# Patient Record
Sex: Male | Born: 1950
Health system: Southern US, Community
[De-identification: ages and names within clinical notes are randomized; demographics above are authoritative.]

## PROBLEM LIST (undated history)

## (undated) DIAGNOSIS — M109 Gout, unspecified: Secondary | ICD-10-CM

## (undated) DIAGNOSIS — K5790 Diverticulosis of intestine, part unspecified, without perforation or abscess without bleeding: Secondary | ICD-10-CM

## (undated) DIAGNOSIS — E66813 Obesity, class 3: Secondary | ICD-10-CM

## (undated) DIAGNOSIS — T7840XA Allergy, unspecified, initial encounter: Secondary | ICD-10-CM

## (undated) DIAGNOSIS — E785 Hyperlipidemia, unspecified: Secondary | ICD-10-CM

## (undated) DIAGNOSIS — K5792 Diverticulitis of intestine, part unspecified, without perforation or abscess without bleeding: Secondary | ICD-10-CM

## (undated) DIAGNOSIS — M1990 Unspecified osteoarthritis, unspecified site: Secondary | ICD-10-CM

## (undated) DIAGNOSIS — J45909 Unspecified asthma, uncomplicated: Secondary | ICD-10-CM

## (undated) HISTORY — DX: Unspecified asthma, uncomplicated: J45.909

## (undated) HISTORY — DX: Hyperlipidemia, unspecified: E78.5

## (undated) HISTORY — DX: Gout, unspecified: M10.9

## (undated) HISTORY — DX: Diverticulitis of intestine, part unspecified, without perforation or abscess without bleeding: K57.92

## (undated) HISTORY — DX: Allergy, unspecified, initial encounter: T78.40XA

## (undated) HISTORY — DX: Unspecified osteoarthritis, unspecified site: M19.90

## (undated) HISTORY — DX: Obesity, class 3: E66.813

## (undated) HISTORY — DX: Diverticulosis of intestine, part unspecified, without perforation or abscess without bleeding: K57.90

## (undated) HISTORY — DX: Morbid (severe) obesity due to excess calories: E66.01

---

## 2005-09-23 ENCOUNTER — Inpatient Hospital Stay (HOSPITAL_COMMUNITY): Admission: EM | Admit: 2005-09-23 | Discharge: 2005-09-30 | Payer: Self-pay | Admitting: Internal Medicine

## 2005-09-23 ENCOUNTER — Encounter: Payer: Self-pay | Admitting: Internal Medicine

## 2005-09-23 ENCOUNTER — Ambulatory Visit: Payer: Self-pay | Admitting: Critical Care Medicine

## 2006-02-26 ENCOUNTER — Emergency Department (HOSPITAL_COMMUNITY): Admission: EM | Admit: 2006-02-26 | Discharge: 2006-02-26 | Payer: Self-pay | Admitting: Emergency Medicine

## 2006-03-02 ENCOUNTER — Emergency Department (HOSPITAL_COMMUNITY): Admission: EM | Admit: 2006-03-02 | Discharge: 2006-03-02 | Payer: Self-pay | Admitting: Family Medicine

## 2006-03-25 ENCOUNTER — Encounter: Admission: RE | Admit: 2006-03-25 | Discharge: 2006-03-25 | Payer: Self-pay | Admitting: Orthopaedic Surgery

## 2011-03-04 ENCOUNTER — Encounter: Payer: Self-pay | Admitting: *Deleted

## 2011-03-04 ENCOUNTER — Encounter: Payer: Federal, State, Local not specified - PPO | Attending: Family Medicine | Admitting: *Deleted

## 2011-03-04 DIAGNOSIS — Z713 Dietary counseling and surveillance: Secondary | ICD-10-CM | POA: Insufficient documentation

## 2011-03-04 DIAGNOSIS — E119 Type 2 diabetes mellitus without complications: Secondary | ICD-10-CM | POA: Insufficient documentation

## 2011-03-04 NOTE — Patient Instructions (Addendum)
Goals:  Follow Diabetes Meal Plan as instructed (yellow card). First month, focus on Carb-Counting.  Eat 3 meals and 2 snacks, every 3-5 hrs - AVOID meal skipping.  Add lean protein foods to all meals/snacks.  Aim for 30-45 mins of physical activity daily.  Continue to avoid sugar-sweetened beverages and foods.  Record and bring food record to your next nutrition visit.

## 2011-03-04 NOTE — Progress Notes (Signed)
Medical Nutrition Therapy:  Appt start time: 1200 end time:  1300.  Assessment:  T2DM.  Pt with strong family h/o T2DM here for assessment of new onset T2DM. Reports an A1c of 15% and BG 300 mg/dl at MD office mid-Oct 1610; BG of 153 mg/dL at MD last week. No labs available at time of visit to confirm. States he has not been instructed to check BGs. Pt has made many dietary changes since dx. Previously consumed 24-36 oz reg coke/day, now only diet coke and water. Reports he has also d/c'd all sweets, except over Thanksgiving. Pt skips meals daily and excessive CHO intake noted. Pt likes to cook, and states he does not eat out often. Reports physical activity every other day for 30 min.Marland Kitchen  MEDICATIONS: See medication list; reconciled with patient   DIETARY INTAKE:  Usual eating pattern includes 2 meals and 1-2 snacks per day.  24-hr recall:  B ( AM): Bacon (4), eggs, grits (1 c), toast - wheat (2); coffee (black)  Snk ( AM): none  L ( PM): 6 pk peanut butter nabs Snk ( PM): none D ( PM): Cabbage, left over [place], tomato, 3 pcs wheat bread Snk ( PM): applesauce (1/2 c) OR banana (30 g) Beverages: Coffee, water, diet coke  Usual physical activity: Walks 30 min every 3-4 days/week  Estimated energy needs: 1800 calories 200 g carbohydrates 110 g protein 50-55 g fat  Progress Towards Goal(s):  In progress.   Nutritional Diagnosis:  -2.1 Impaired nutrient utilization related to glucose as evidenced by patient reported A1c of 15% and a new dx of T2DM.    Intervention/Goals:  Follow Diabetes Meal Plan as instructed (yellow card). First month, focus on Carb-Counting.  Eat 3 meals and 2 snacks, every 3-5 hrs - AVOID meal skipping.  Add lean protein foods to all meals/snacks.  Aim for 30-45 mins of physical activity daily.  Continue to avoid sugar-sweetened beverages and foods.  Record and bring food record to your next nutrition visit.  Handouts given during visit  include:  Living Well with DM - [employer]  45-60g CHO menus/snack list  Food Record (blank)  Monitoring/Evaluation:  Dietary intake, exercise, A1c (as available), and body weight in 4 week(s).

## 2011-03-05 ENCOUNTER — Encounter: Payer: Self-pay | Admitting: *Deleted

## 2011-04-15 ENCOUNTER — Encounter: Payer: Self-pay | Admitting: *Deleted

## 2011-04-15 ENCOUNTER — Encounter: Payer: Federal, State, Local not specified - PPO | Attending: Family Medicine | Admitting: *Deleted

## 2011-04-15 DIAGNOSIS — E119 Type 2 diabetes mellitus without complications: Secondary | ICD-10-CM | POA: Insufficient documentation

## 2011-04-15 DIAGNOSIS — Z713 Dietary counseling and surveillance: Secondary | ICD-10-CM | POA: Insufficient documentation

## 2011-04-15 NOTE — Patient Instructions (Addendum)
Goals:  Follow Diabetes Meal Plan as instructed (yellow card). Continue focus on Carb-Counting/measure portions.  Add lean protein foods to all meals/snacks.  Aim for 30 mins of physical activity 3 days/wk - gradually increase.   Estimated energy needs: 1800 calories 200 g carbohydrates 110 g protein 50-55 g fat

## 2011-04-15 NOTE — Progress Notes (Signed)
Medical Nutrition Therapy:  Appt start time: 1500  End time: 1530.  Primary concerns today:  T2DM; follow up  Pt here for follow up. Has gained ~3 lbs since last visit (03/04/11). Returns to MD at end of Jan for repeat blood work. Still not checking BG at home (not ordered by MD). Continues dietary changes since dx. No exercise over holidays. Reports pain in left shoulder (rotator cuff).  MEDICATIONS: See medication list; pt reports addition of pravastatin.   DIETARY INTAKE:  Usual eating pattern includes 2-3 meals and 1 snack per day. Has slightly decreased portion sizes, but continues to overeat CHO at meals via starches and fruit.  24-hr recall:  B ( AM): Cereal (Raisan Bran or HN Cheerios) w/  2% milk OR eggs, bacon (2-3 pc) grits (1 c), toast - wheat (2); coffee (black)  Snk ( AM): none  L ( PM): Sandwich - ham and cheese w/ miracle whip on wheat, sometimes pc of fruit; diet soda or water  Snk ( PM): none D ( PM): Chicken, green beans, 1/2 potato (3 oz) Snk ( PM): fruit cup (drinks juice) OR banana (1/2) OR 6 pk PB crackers Beverages: Coffee, water, diet coke  Usual physical activity: Recently resumed walks 2x/wk @ 20 min  Estimated energy needs: 1800 calories 200 g carbohydrates 110 g protein 50-55 g fat  Progress Towards Goal(s):  In progress; continue   Nutritional Diagnosis:  [place]-2.1 Impaired nutrient utilization related to glucose as evidenced by patient reported A1c of 15% and a new dx of T2DM.    Intervention/Goals:  Follow Diabetes Meal Plan as instructed (yellow card). First month, focus on Carb-Counting.  Eat 3 meals and 2 snacks, every 3-5 hrs - AVOID meal skipping.  Add lean protein foods to all meals/snacks.  Aim for 30-45 mins of physical activity daily.  Continue to avoid sugar-sweetened beverages and foods.  Record and bring food record to your next nutrition visit.  Handouts given during visit include:  Living Well with DM - [employer]  45-60g CHO  menus/snack list  Food Record (blank)  Monitoring/Evaluation:  Dietary intake, exercise, A1c (as available), and body weight in 4 week(s).

## 2011-05-20 ENCOUNTER — Encounter: Payer: Self-pay | Admitting: *Deleted

## 2011-05-20 ENCOUNTER — Encounter: Payer: Federal, State, Local not specified - PPO | Attending: Family Medicine | Admitting: *Deleted

## 2011-05-20 DIAGNOSIS — Z713 Dietary counseling and surveillance: Secondary | ICD-10-CM | POA: Insufficient documentation

## 2011-05-20 DIAGNOSIS — E119 Type 2 diabetes mellitus without complications: Secondary | ICD-10-CM | POA: Insufficient documentation

## 2011-05-20 NOTE — Progress Notes (Signed)
Medical Nutrition Therapy:  Appt start time: 1500  End time: 1530.  Primary concerns today:  T2DM; follow up  Pt here for follow up with additional 3.5 lb wt gain since last visit (04/15/11). Reports recent blood work results including FBG 114 mg, A1c 7.5%, and TC 158 mg. Still not checking BG at home. Continues dietary changes since dx. Has increase exercise since last visit. Concerned about lack of wt loss. Will do calorie count on 3-day food record provided by pt at visit. Reports pain in left shoulder (rotator cuff) is subsiding.  TANITA  BODY COMP RESULTS %Fat: 55.9% FM: 179.5 lb FFM: 141.5 lb TBW: 103.5 lb  MEDICATIONS: See medication list; No changes since last visit.   DIETARY INTAKE:  Usual eating pattern includes 2-3 meals and 1 snack per day. Has slightly decreased portion sizes, but continues to overeat CHO at meals via starches and fruit.  Usual physical activity:  Walking 6-7d/wk @ 35-40 min  Estimated energy needs: 1800 calories 200 g carbohydrates 110 g protein 50-55 g fat  Progress Towards Goal(s):  In progress; continue   Nutritional Diagnosis:  [place]-2.1 Impaired nutrient utilization related to glucose as evidenced by patient reported A1c of 15% and a new dx of T2DM.    Intervention/Goals:  Continue to follow Diabetes Meal Plan as instructed (yellow card). Continue focus on Carb-Counting/measure portions.  Add lean protein foods to all meals/snacks.  Continue physical activity daily.  Monitoring/Evaluation:  Dietary intake, exercise, A1c (as available), and body weight in 8 week(s).

## 2011-05-20 NOTE — Patient Instructions (Signed)
Goals:  Continue to follow Diabetes Meal Plan as instructed (yellow card). Continue focus on Carb-Counting/measure portions.  Add lean protein foods to all meals/snacks.  Continue physical activity daily.  Estimated energy needs: 1800 calories 200 g carbohydrates 110 g protein 50-55 g fat

## 2011-10-11 ENCOUNTER — Other Ambulatory Visit: Payer: Self-pay | Admitting: Obstetrics and Gynecology

## 2011-10-11 DIAGNOSIS — R1032 Left lower quadrant pain: Secondary | ICD-10-CM

## 2011-10-12 ENCOUNTER — Ambulatory Visit
Admission: RE | Admit: 2011-10-12 | Discharge: 2011-10-12 | Disposition: A | Discharge status: Home / Self Care | Payer: Federal, State, Local not specified - PPO | Source: Ambulatory Visit | Attending: Obstetrics and Gynecology | Admitting: Obstetrics and Gynecology

## 2011-10-12 DIAGNOSIS — R1032 Left lower quadrant pain: Secondary | ICD-10-CM

## 2011-10-12 MED ORDER — IOHEXOL 300 MG/ML  SOLN
100.0000 mL | Freq: Once | INTRAMUSCULAR | Status: AC | PRN
  Administered 2011-10-12: 100 mL via INTRAVENOUS

## 2015-06-17 ENCOUNTER — Emergency Department (INDEPENDENT_AMBULATORY_CARE_PROVIDER_SITE_OTHER)
Admission: EM | Admit: 2015-06-17 | Discharge: 2015-06-17 | Disposition: A | Discharge status: Home / Self Care | Payer: PPO | Source: Home / Self Care | Attending: Family Medicine | Admitting: Family Medicine

## 2015-06-17 ENCOUNTER — Encounter (HOSPITAL_COMMUNITY): Payer: Self-pay | Admitting: *Deleted

## 2015-06-17 DIAGNOSIS — Z760 Encounter for issue of repeat prescription: Secondary | ICD-10-CM

## 2015-06-17 MED ORDER — PRAVASTATIN SODIUM 40 MG PO TABS: 40.0000 mg | ORAL_TABLET | Freq: Every day | ORAL | Status: DC

## 2015-06-17 MED ORDER — METFORMIN HCL 1000 MG PO TABS: 1000.0000 mg | ORAL_TABLET | Freq: Two times a day (BID) | ORAL | Status: DC

## 2015-06-17 NOTE — ED Provider Notes (Signed)
CSN: [id number]     Arrival date & time 06/17/15  1304 History   First MD Initiated Contact with Patient 06/17/15 1340     Chief Complaint  Patient presents with  . Medication Refill   (Consider location/radiation/quality/duration/timing/severity/associated sxs/prior Treatment) HPI Pt's PCP died no referred PCP. Out of metformin and pravastatin. No other complaints.  Last A1C 6.1  Past Medical History  Diagnosis Date  . Gout   . Diabetes mellitus   . Diverticulitis   . Diverticulosis   . Obesity, Class III, BMI 40-49.9 (morbid obesity) ([place])   . Hyperlipidemia   . Arthritis    History reviewed. No pertinent past surgical history. Family History  Problem Relation Age of Onset  . Diabetes Brother   . Hypertension Brother   . Diabetes Brother   . Hypertension Brother   . Diabetes Father   . Hypertension Father   . Diabetes Sister    Social History  Substance Use Topics  . Smoking status: Never Smoker   . Smokeless tobacco: None  . Alcohol Use: Yes    Review of Systems Medication refill Allergies  Review of patient's allergies indicates no known allergies.  Home Medications   Prior to Admission medications   Medication Sig Start Date End Date Taking? Authorizing Provider  LISINOPRIL PO Take by mouth daily.     Yes Historical Provider, MD  metFORMIN (GLUCOPHAGE) 1000 MG tablet Take 1,000 mg by mouth daily with breakfast.     Yes Historical Provider, MD  pravastatin (PRAVACHOL) 10 MG tablet Take 10 mg by mouth daily.   Yes Historical Provider, MD   Meds Ordered and Administered this Visit  Medications - No data to display  BP 141/89 mmHg  Pulse 92  Temp(Src) 97.8 F (36.6 C) (Oral)  Resp 16  SpO2 97% No data found.   Physical Exam NURSES NOTES AND VITAL SIGNS REVIEWED. CONSTITUTIONAL: Well developed, well nourished, no acute distress HEENT: normocephalic, atraumatic, right and left TM's are normal EYES: Conjunctiva normal NECK:normal ROM, supple, no  adenopathy PULMONARY:No respiratory distress, normal effort, Lungs: CTAb/l, no wheezes, or increased work of breathing CARDIOVASCULAR: RRR, no murmur ABDOMEN: soft, ND, NT, +'ve BS MUSCULOSKELETAL: Normal ROM of all extremities,  SKIN: warm and dry without rash PSYCHIATRIC: Mood and affect, behavior are normal  ED Course  Procedures (including critical care time)  Labs Review Labs Reviewed - No data to display  Imaging Review No results found.   Visual Acuity Review  Right Eye Distance:   Left Eye Distance:   Bilateral Distance:    Right Eye Near:   Left Eye Near:    Bilateral Near:       Pt is advised that [facility] is happy to refill this medication once. It is not in the best interest of the patient to let [facility] act as his PCP. Future medication request must be obtained from PCP.  This is a one time service.   MDM  No diagnosis found.  Patient is reassured that there are no issues that require transfer to higher level of care at this time or additional tests. Patient is advised to continue home symptomatic treatment. Patient is advised that if there are new or worsening symptoms to attend the emergency department, contact primary care provider, or return to UC. Instructions of care provided discharged home in stable condition. Return to work/school note provided.   THIS NOTE WAS GENERATED USING A VOICE RECOGNITION SOFTWARE PROGRAM. ALL REASONABLE EFFORTS  WERE MADE TO PROOFREAD THIS  DOCUMENT FOR ACCURACY.  I have verbally reviewed the discharge instructions with the patient. A printed AVS was given to the patient.  All questions were answered prior to discharge.      Konrad Felix, [place] 06/17/15 [phone]

## 2015-06-17 NOTE — ED Notes (Addendum)
Patient reports running out of prescription for metformin and pravastation, last dose last night. Pt no longer has PCP. Patient reports no issues. No pain. Diabetes has been under control.

## 2015-06-17 NOTE — Discharge Instructions (Signed)
Medicine Refill at the Emergency Department  We have refilled your medicine today, but it is best for you to get refills through your primary health care provider's office. In the future, please plan ahead so you do not need to get refills from the emergency department.  If the medicine we refilled was a maintenance medicine, you may have received only enough to get you by until you are able to see your regular health care provider.     This information is not intended to replace advice given to you by your health care provider. Make sure you discuss any questions you have with your health care provider.     Document Released: 07/09/2003 Document Revised: 04/12/2014 Document Reviewed: 06/29/2013  Elsevier Interactive Patient Education 2016 Elsevier Inc.

## 2015-08-28 ENCOUNTER — Ambulatory Visit (INDEPENDENT_AMBULATORY_CARE_PROVIDER_SITE_OTHER): Payer: PPO | Admitting: Family

## 2015-08-28 ENCOUNTER — Other Ambulatory Visit (INDEPENDENT_AMBULATORY_CARE_PROVIDER_SITE_OTHER): Payer: PPO

## 2015-08-28 ENCOUNTER — Encounter: Payer: Self-pay | Admitting: Family

## 2015-08-28 VITALS — BP 110/80 | HR 83 | Temp 97.7°F | Resp 16 | Ht 69.0 in | Wt 334.0 lb

## 2015-08-28 DIAGNOSIS — E1122 Type 2 diabetes mellitus with diabetic chronic kidney disease: Secondary | ICD-10-CM | POA: Insufficient documentation

## 2015-08-28 DIAGNOSIS — M109 Gout, unspecified: Secondary | ICD-10-CM

## 2015-08-28 DIAGNOSIS — E119 Type 2 diabetes mellitus without complications: Secondary | ICD-10-CM | POA: Diagnosis not present

## 2015-08-28 DIAGNOSIS — Z23 Encounter for immunization: Secondary | ICD-10-CM | POA: Diagnosis not present

## 2015-08-28 DIAGNOSIS — J302 Other seasonal allergic rhinitis: Secondary | ICD-10-CM

## 2015-08-28 LAB — BASIC METABOLIC PANEL
BUN: 15 mg/dL (ref 6–23)
CALCIUM: 9.4 mg/dL (ref 8.4–10.5)
CO2: 27 meq/L (ref 19–32)
CREATININE: 1.2 mg/dL (ref 0.40–1.50)
Chloride: 105 mEq/L (ref 96–112)
GFR: 77.93 mL/min (ref >60.00)
GLUCOSE: 139 mg/dL — AB (ref 70–99)
POTASSIUM: 4.6 meq/L (ref 3.5–5.1)
SODIUM: 137 meq/L (ref 135–145)

## 2015-08-28 LAB — MICROALBUMIN / CREATININE URINE RATIO
CREATININE, U: 156 mg/dL
MICROALB UR: 4.1 mg/dL — AB (ref 0.0–1.9)
MICROALB/CREAT RATIO: 2.6 mg/g (ref 0.0–30.0)

## 2015-08-28 LAB — HEMOGLOBIN A1C: HEMOGLOBIN A1C: 7.7 % — AB (ref 4.6–6.5)

## 2015-08-28 MED ORDER — LISINOPRIL 5 MG PO TABS
5.0000 mg | ORAL_TABLET | Freq: Every day | ORAL | Status: DC
Start: 2015-08-28 — End: 2016-03-17

## 2015-08-28 NOTE — Progress Notes (Signed)
Subjective:    Patient ID: Roger Alvarado, male    DOB: February 04, 1951, 65 y.o.   MRN: [id number]  Chief Complaint  Patient presents with  . Establish Care    needs refill of lisinopril    HPI:  Roger Alvarado is a 65 y.o. male who  has a past medical history of Gout; Diabetes mellitus; Diverticulitis; Diverticulosis; Obesity, Class III, BMI 40-49.9 (morbid obesity) ([place]); Hyperlipidemia; Arthritis; Asthma; Allergy; and Gout. and presents today for an office visit to establish care.   1.) Type 2 diabetes - Currently maintained on metformin. Reports taking medication as prescribed and denies adverse side effects. Not currently taking his blood sugars at home. Occasional changes in vision. Denies any other symptoms of end organ damage.   2.) Gout - Previously diagnosed with gout with attacks usually occuring in the ankle or knee. Frequency of attacks is about 1-2 per year with no flares recently. Modifying factors include colchicine which does an adequate job.   3.) Seasonal allergies - Maintained on OTC medications and reports symptoms are overall stable with no significant flares. Denies any current rhinorrhea, congestion, sneezing or itchy/watery eyes.    No Known Allergies   Outpatient Prescriptions Prior to Visit  Medication Sig Dispense Refill  . metFORMIN (GLUCOPHAGE) 1000 MG tablet Take 1 tablet (1,000 mg total) by mouth 2 (two) times daily. 180 tablet 1  . pravastatin (PRAVACHOL) 40 MG tablet Take 1 tablet (40 mg total) by mouth daily. 90 tablet 1  . LISINOPRIL PO Take by mouth daily.      . metFORMIN (GLUCOPHAGE) 1000 MG tablet Take 1,000 mg by mouth daily with breakfast.      . pravastatin (PRAVACHOL) 10 MG tablet Take 10 mg by mouth daily.     No facility-administered medications prior to visit.     Past Medical History  Diagnosis Date  . Gout   . Diabetes mellitus   . Diverticulitis   . Diverticulosis   . Obesity, Class III, BMI 40-49.9 (morbid obesity) ([place])     . Hyperlipidemia   . Arthritis   . Asthma     Childhood  . Allergy   . Gout      History reviewed. No pertinent past surgical history.   Family History  Problem Relation Age of Onset  . Diabetes Brother   . Hypertension Brother   . Diabetes Brother   . Hypertension Brother   . Diabetes Father   . Hypertension Father   . Diabetes Sister   . Asthma Mother   . Heart attack Maternal Grandfather      Social History   Social History  . Marital Status: Married    Spouse Name: N/A  . Number of Children: 2  . Years of Education: 18   Occupational History  . Retired    Social History Main Topics  . Smoking status: Never Smoker   . Smokeless tobacco: Never Used  . Alcohol Use: 3.6 oz/week    6 Cans of beer per week  . Drug Use: No  . Sexual Activity: Not on file   Other Topics Concern  . Not on file   Social History Narrative   Fun: Watch sports, gardening       Review of Systems  Constitutional: Negative for fever and chills.  Eyes:       Negative for changes in vision  Respiratory: Negative for cough, chest tightness and wheezing.   Cardiovascular: Negative for chest pain, palpitations and leg  swelling.  Endocrine: Negative for polydipsia, polyphagia and polyuria.  Neurological: Negative for dizziness, weakness and light-headedness.      Objective:    BP 110/80 mmHg  Pulse 83  Temp(Src) 97.7 F (36.5 C) (Oral)  Resp 16  Ht 5\' 9"  (1.753 m)  Wt 334 lb (151.501 kg)  BMI 49.30 kg/m2  SpO2 97% Nursing note and vital signs reviewed.  Physical Exam  Constitutional: He is oriented to person, place, and time. He appears well-developed and well-nourished. No distress.  Morbidly obese male seated in the chair in no apparent distress. Dressed appropriately for situation  Cardiovascular: Normal rate, regular rhythm, normal heart sounds and intact distal pulses.   Pulmonary/Chest: Effort normal and breath sounds normal.  Neurological: He is alert and  oriented to person, place, and time.  Diabetic foot exam - bilateral feet are free from skin breakdown, cuts, and abrasions. Toenails are neatly trimmed. Pulses are intact and appropriate. Sensation is intact to monofilament bilaterally.  Skin: Skin is warm and dry.  Psychiatric: He has a normal mood and affect. His behavior is normal. Judgment and thought content normal.       Assessment & Plan:   Problem List Items Addressed This Visit      Respiratory   Seasonal allergies    Stable with OTC medications and no adverse side effects. Continue current OTC medications and follow up if symptoms worsen or no longer controlled.         Endocrine   Type 2 diabetes mellitus ([place]) - Primary    Type 2 diabetes of undetermined status. Obtain A1c and microalbumin. Continue current dosage of metformin. Maintained on lisinopril and pravastatin for CAD risk reduction. Encouraged to complete diabetic eye exam independently. Diabetic foot exam completed today. Encouraged to monitor blood sugars at home. Encouraged physical activity and nutrition to help regulate blood sugars. Follow-up pending A1c results.      Relevant Medications   aspirin 81 MG tablet   lisinopril (PRINIVIL,ZESTRIL) 5 MG tablet   Other Relevant Orders   Hemoglobin A1c (Completed)   Urine Microalbumin w/creat. ratio (Completed)   Basic Metabolic Panel (BMET) (Completed)     Other   Gout    Gout appears well controlled with no medications needed and infrequent flares. Previously maintained on colchicine as needed. Refill as needed. Continue to monitor.      Morbid obesity ([facility])    BMI of 49. Recommend weight loss of approximately 5-10% of current body weight through nutrition and physical activity.Recommend increasing physical activity to 30 minutes of moderate level activity daily. Encourage nutritional intake that focuses on nutrient dense foods and is moderate, varied, and balanced and is low in saturated fats and  processed/sugary foods. Continue to monitor.        Other Visit Diagnoses    Need for vaccination with 13-polyvalent pneumococcal conjugate vaccine        Relevant Orders    Pneumococcal conjugate vaccine 13-valent IM (Completed)    Need for diphtheria-tetanus-pertussis (Tdap) vaccine, adult/adolescent        Relevant Orders    Tdap vaccine greater than or equal to 7yo IM (Completed)        I have discontinued Mr. Dvorsky's LISINOPRIL PO. I have also changed his lisinopril. Additionally, I am having him maintain his metFORMIN, pravastatin, and aspirin.   Meds ordered this encounter  Medications  . DISCONTD: lisinopril (PRINIVIL,ZESTRIL) 5 MG tablet    Sig: Take 5 mg by mouth daily.  Marland Kitchen aspirin  81 MG tablet    Sig: Take 81 mg by mouth daily.  Marland Kitchen lisinopril (PRINIVIL,ZESTRIL) 5 MG tablet    Sig: Take 1 tablet (5 mg total) by mouth daily.    Dispense:  90 tablet    Refill:  1    Order Specific Question:  Supervising Provider    Answer:  Pricilla Holm A [id number]     Follow-up: Return in about 1 month (around 09/28/2015), or if symptoms worsen or fail to improve.  Mauricio Po, FNP

## 2015-08-28 NOTE — Assessment & Plan Note (Signed)
Stable with OTC medications and no adverse side effects. Continue current OTC medications and follow up if symptoms worsen or no longer controlled.

## 2015-08-28 NOTE — Assessment & Plan Note (Signed)
BMI of 49. Recommend weight loss of approximately 5-10% of current body weight through nutrition and physical activity.Recommend increasing physical activity to 30 minutes of moderate level activity daily. Encourage nutritional intake that focuses on nutrient dense foods and is moderate, varied, and balanced and is low in saturated fats and processed/sugary foods. Continue to monitor.

## 2015-08-28 NOTE — Assessment & Plan Note (Signed)
Gout appears well controlled with no medications needed and infrequent flares. Previously maintained on colchicine as needed. Refill as needed. Continue to monitor.

## 2015-08-28 NOTE — Patient Instructions (Addendum)
Thank you for choosing [facility].  Summary/Instructions:  Please continue to take your medications as prescribed.  Please schedule a time for your annual wellness exam.  Your prescription(s) have been submitted to your pharmacy or been printed and provided for you. Please take as directed and contact our office if you believe you are having problem(s) with the medication(s) or have any questions.  Please stop by the lab on the basement level of the building for your blood work. Your results will be released to Ashley (or called to you) after review, usually within 72 hours after test completion. If any changes need to be made, you will be notified at that same time.

## 2015-08-28 NOTE — Assessment & Plan Note (Signed)
Type 2 diabetes of undetermined status. Obtain A1c and microalbumin. Continue current dosage of metformin. Maintained on lisinopril and pravastatin for CAD risk reduction. Encouraged to complete diabetic eye exam independently. Diabetic foot exam completed today. Encouraged to monitor blood sugars at home. Encouraged physical activity and nutrition to help regulate blood sugars. Follow-up pending A1c results.

## 2015-08-29 ENCOUNTER — Telehealth: Payer: Self-pay | Admitting: Family

## 2015-08-29 NOTE — Telephone Encounter (Signed)
Pt is aware of results. He is going to check with insurance to see what medications would be covered.

## 2015-08-29 NOTE — Telephone Encounter (Signed)
Please inform patient that his blood work shows that his A1c is 7.7 and there is mild amount of protein in his urine which is most likely related to diabetes. Overall his kidney function is fairly well maintained. I would like to get his A1c below 7 if we can. There are a couple of options. The first would be to increase the metformin and the second would be to add a different agent. If he would check his formulary to see what is covered we can do this is in a cost effective manner.

## 2016-01-08 ENCOUNTER — Other Ambulatory Visit: Payer: Self-pay | Admitting: Family

## 2016-01-15 ENCOUNTER — Telehealth: Payer: Self-pay

## 2016-01-15 MED ORDER — COLCHICINE 0.6 MG PO TABS: ORAL_TABLET | ORAL | 1 refills | Status: DC

## 2016-01-15 NOTE — Addendum Note (Signed)
Addended by: Mauricio Po D on: 01/15/2016 10:18 PM   Modules accepted: Orders

## 2016-01-15 NOTE — Telephone Encounter (Signed)
Please advise 

## 2016-01-15 NOTE — Telephone Encounter (Signed)
Medication sent to pharmacy  

## 2016-01-15 NOTE — Telephone Encounter (Signed)
Please advise patient called in and stated that he is having a gout flare up in his left foot and ankle. He states that he saw Roger Alvarado in may for this same issue. Does he need to come in for an office visit or can something be called in for this.

## 2016-02-24 DIAGNOSIS — E119 Type 2 diabetes mellitus without complications: Secondary | ICD-10-CM | POA: Diagnosis not present

## 2016-02-24 DIAGNOSIS — Z0100 Encounter for examination of eyes and vision without abnormal findings: Secondary | ICD-10-CM | POA: Diagnosis not present

## 2016-02-24 LAB — HM DIABETES EYE EXAM

## 2016-02-25 ENCOUNTER — Encounter: Payer: Self-pay | Admitting: Family

## 2016-03-17 ENCOUNTER — Other Ambulatory Visit: Payer: Self-pay | Admitting: Family

## 2016-03-17 DIAGNOSIS — E119 Type 2 diabetes mellitus without complications: Secondary | ICD-10-CM

## 2016-05-01 ENCOUNTER — Encounter (HOSPITAL_COMMUNITY): Payer: Self-pay | Admitting: Emergency Medicine

## 2016-05-01 ENCOUNTER — Ambulatory Visit (HOSPITAL_COMMUNITY)
Admission: EM | Admit: 2016-05-01 | Discharge: 2016-05-01 | Disposition: A | Discharge status: Home / Self Care | Payer: PPO | Attending: Family Medicine | Admitting: Family Medicine

## 2016-05-01 DIAGNOSIS — M1000 Idiopathic gout, unspecified site: Secondary | ICD-10-CM

## 2016-05-01 MED ORDER — COLCHICINE 0.6 MG PO TABS: 0.6000 mg | ORAL_TABLET | Freq: Two times a day (BID) | ORAL | 1 refills | Status: DC

## 2016-05-01 MED ORDER — INDOMETHACIN 50 MG PO CAPS: 50.0000 mg | ORAL_CAPSULE | Freq: Three times a day (TID) | ORAL | 1 refills | Status: DC

## 2016-05-01 NOTE — Discharge Instructions (Signed)
Take medicine as prescribed and see your doctor if further problems

## 2016-05-01 NOTE — ED Triage Notes (Signed)
Patient reports this a gout flare up

## 2016-05-01 NOTE — ED Provider Notes (Signed)
[place]    CSN: [id number] Arrival date & time: 05/01/16  1207     History   Chief Complaint No chief complaint on file.   HPI Roger Alvarado is a 66 y.o. male.    Ankle Pain  Location:  Ankle Time since incident:  2 days Injury: no   Ankle location:  R ankle Pain details:    Quality:  Burning and sharp   Severity:  Moderate   Onset quality:  Sudden   Progression:  Unchanged Chronicity:  Recurrent (h/o gout in ankle.) Dislocation: no   Prior injury to area:  No Relieved by:  None tried Worsened by:  Nothing Associated symptoms: decreased ROM, stiffness and swelling   Risk factors: obesity     Past Medical History:  Diagnosis Date  . Allergy   . Arthritis   . Asthma    Childhood  . Diabetes mellitus   . Diverticulitis   . Diverticulosis   . Gout   . Gout   . Hyperlipidemia   . Obesity, Class III, BMI 40-49.9 (morbid obesity) ([place])     Patient Active Problem List   Diagnosis Date Noted  . Type 2 diabetes mellitus ([place]) 08/28/2015  . Gout 08/28/2015  . Morbid obesity ([place]) 08/28/2015  . Seasonal allergies 08/28/2015    No past surgical history on file.     Home Medications    Prior to Admission medications   Medication Sig Start Date End Date Taking? Authorizing Provider  aspirin 81 MG tablet Take 81 mg by mouth daily.    Historical Provider, MD  colchicine 0.6 MG tablet Take 2 tablets at the onset of flare and 1 tablet 1 hour later. 01/15/16   Golden Circle, FNP  lisinopril (PRINIVIL,ZESTRIL) 5 MG tablet TAKE 1 TABLET (5 MG TOTAL) BY MOUTH DAILY. 03/18/16   Golden Circle, FNP  metFORMIN (GLUCOPHAGE) 1000 MG tablet TAKE 1 TABLET (1,000 MG TOTAL) BY MOUTH 2 (TWO) TIMES DAILY. 01/12/16   Golden Circle, FNP  pravastatin (PRAVACHOL) 40 MG tablet TAKE 1 TABLET (40 MG TOTAL) BY MOUTH DAILY. 01/12/16   Golden Circle, FNP    Family History Family History  Problem Relation Age of Onset  . Diabetes Brother   . Hypertension  Brother   . Diabetes Brother   . Hypertension Brother   . Diabetes Father   . Hypertension Father   . Diabetes Sister   . Asthma Mother   . Heart attack Maternal Grandfather     Social History Social History  Substance Use Topics  . Smoking status: Never Smoker  . Smokeless tobacco: Never Used  . Alcohol use 3.6 oz/week    6 Cans of beer per week     Allergies   Patient has no known allergies.   Review of Systems Review of Systems  Musculoskeletal: Positive for gait problem, joint swelling and stiffness.  Skin: Negative.   All other systems reviewed and are negative.    Physical Exam Triage Vital Signs ED Triage Vitals  Enc Vitals Group     BP      Pulse      Resp      Temp      Temp src      SpO2      Weight      Height      Head Circumference      Peak Flow      Pain Score      Pain  Loc      Pain Edu?      Excl. in [place]?    No data found.   Updated Vital Signs There were no vitals taken for this visit.  Visual Acuity Right Eye Distance:   Left Eye Distance:   Bilateral Distance:    Right Eye Near:   Left Eye Near:    Bilateral Near:     Physical Exam  Constitutional: He is oriented to person, place, and time. He appears well-developed and well-nourished. No distress.  Musculoskeletal: He exhibits tenderness. He exhibits no deformity.  Tender sts right ankle, sl warmth.  Neurological: He is alert and oriented to person, place, and time.  Skin: Skin is warm and dry.  Nursing note and vitals reviewed.    UC Treatments / Results  Labs (all labs ordered are listed, but only abnormal results are displayed) Labs Reviewed - No data to display  EKG  EKG Interpretation None       Radiology No results found.  Procedures Procedures (including critical care time)  Medications Ordered in UC Medications - No data to display   Initial Impression / Assessment and Plan / UC Course  I have reviewed the triage vital signs and the nursing  notes.  Pertinent labs & imaging results that were available during my care of the patient were reviewed by me and considered in my medical decision making (see chart for details).       Final Clinical Impressions(s) / UC Diagnoses   Final diagnoses:  None    New Prescriptions New Prescriptions   No medications on file     Billy Fischer, MD 05/01/16 1315

## 2016-06-01 DIAGNOSIS — K5730 Diverticulosis of large intestine without perforation or abscess without bleeding: Secondary | ICD-10-CM | POA: Diagnosis not present

## 2016-06-01 DIAGNOSIS — Z1211 Encounter for screening for malignant neoplasm of colon: Secondary | ICD-10-CM | POA: Diagnosis not present

## 2016-07-14 ENCOUNTER — Other Ambulatory Visit: Payer: Self-pay | Admitting: Family

## 2016-08-20 ENCOUNTER — Ambulatory Visit (HOSPITAL_COMMUNITY)
Admission: EM | Admit: 2016-08-20 | Discharge: 2016-08-20 | Disposition: A | Discharge status: Home / Self Care | Payer: PPO | Attending: Internal Medicine | Admitting: Internal Medicine

## 2016-08-20 ENCOUNTER — Encounter (HOSPITAL_COMMUNITY): Payer: Self-pay | Admitting: Emergency Medicine

## 2016-08-20 DIAGNOSIS — M1000 Idiopathic gout, unspecified site: Secondary | ICD-10-CM

## 2016-08-20 MED ORDER — COLCHICINE 0.6 MG PO TABS: 0.6000 mg | ORAL_TABLET | Freq: Two times a day (BID) | ORAL | 1 refills | Status: DC

## 2016-08-20 NOTE — ED Triage Notes (Signed)
Here for gout flare up on left foot/ankle onset 5 days... Hx of gout  Sts he was taking left over meds for gout x2 days w/no relief and has ran out   Denies inj/trauma... Steady gait... A&O x4... NAD... Ambulatory

## 2016-08-20 NOTE — ED Provider Notes (Signed)
CSN: [id number]     Arrival date & time 08/20/16  1130 History   None    Chief Complaint  Patient presents with  . Gout   (Consider location/radiation/quality/duration/timing/severity/associated sxs/prior Treatment) C/o gout flare in left big toe.  He usually takes colchicine and has ran out.   The history is provided by the patient.  Foot Pain  This is a new problem. The problem occurs constantly. The problem has not changed since onset.Nothing aggravates the symptoms. Nothing relieves the symptoms. He has tried nothing for the symptoms.    Past Medical History:  Diagnosis Date  . Allergy   . Arthritis   . Asthma    Childhood  . Diabetes mellitus   . Diverticulitis   . Diverticulosis   . Gout   . Gout   . Hyperlipidemia   . Obesity, Class III, BMI 40-49.9 (morbid obesity) ([place])    History reviewed. No pertinent surgical history. Family History  Problem Relation Age of Onset  . Diabetes Brother   . Hypertension Brother   . Diabetes Brother   . Hypertension Brother   . Diabetes Father   . Hypertension Father   . Diabetes Sister   . Asthma Mother   . Heart attack Maternal Grandfather    Social History  Substance Use Topics  . Smoking status: Never Smoker  . Smokeless tobacco: Never Used  . Alcohol use 3.6 oz/week    6 Cans of beer per week    Review of Systems  Constitutional: Negative.   HENT: Negative.   Eyes: Negative.   Respiratory: Negative.   Cardiovascular: Negative.   Gastrointestinal: Negative.   Endocrine: Negative.   Genitourinary: Negative.   Musculoskeletal: Positive for arthralgias.  Skin: Negative.   Allergic/Immunologic: Negative.   Neurological: Negative.   Hematological: Negative.   Psychiatric/Behavioral: Negative.     Allergies  Patient has no known allergies.  Home Medications   Prior to Admission medications   Medication Sig Start Date End Date Taking? Authorizing Provider  aspirin 81 MG tablet Take 81 mg by mouth daily.    Yes [provider]  lisinopril (PRINIVIL,ZESTRIL) 5 MG tablet TAKE 1 TABLET (5 MG TOTAL) BY MOUTH DAILY. 03/18/16  Yes Golden Circle, FNP  metFORMIN (GLUCOPHAGE) 1000 MG tablet Take 1 tablet (1,000 mg total) by mouth 2 (two) times daily. Overdue for follow-up appt w/labs must see Marya Amsler for appt 07/14/16  Yes Golden Circle, FNP  pravastatin (PRAVACHOL) 40 MG tablet Take 1 tablet (40 mg total) by mouth daily. Overdue for follow-up appt w/labs must see Marya Amsler for appt 07/14/16  Yes Golden Circle, FNP  colchicine 0.6 MG tablet Take 1 tablet (0.6 mg total) by mouth 2 (two) times daily. 08/20/16   Lysbeth Penner, FNP  indomethacin (INDOCIN) 50 MG capsule Take 1 capsule (50 mg total) by mouth 3 (three) times daily with meals. 05/01/16   Billy Fischer, MD   Meds Ordered and Administered this Visit  Medications - No data to display  BP 109/72 (BP Location: Left Arm)   Pulse 74   Temp 98 F (36.7 C) (Oral)   Resp 20   SpO2 96%  No data found.   Physical Exam  Constitutional: He appears well-developed and well-nourished.  HENT:  Head: Normocephalic and atraumatic.  Eyes: Conjunctivae and EOM are normal. Pupils are equal, round, and reactive to light.  Neck: Normal range of motion. Neck supple.  Cardiovascular: Normal rate, regular rhythm and normal heart sounds.  Pulmonary/Chest: Effort normal and breath sounds normal.  Musculoskeletal: He exhibits tenderness.  TTP left first toe. First toe with erythema and swelling.  Nursing note and vitals reviewed.   Urgent Care Course     Procedures (including critical care time)  Labs Review Labs Reviewed - No data to display  Imaging Review No results found.   Visual Acuity Review  Right Eye Distance:   Left Eye Distance:   Bilateral Distance:    Right Eye Near:   Left Eye Near:    Bilateral Near:         MDM   1. Acute idiopathic gout, unspecified site    Colchicine 0.6mg  one po bid prn #30  Push po  fluids, rest, tylenol and motrin otc prn as directed for fever, arthralgias, and myalgias.  Follow up prn if sx's continue or persist.    Lysbeth Penner, FNP 08/20/16 1259

## 2016-08-21 ENCOUNTER — Other Ambulatory Visit: Payer: Self-pay | Admitting: Family

## 2016-09-03 ENCOUNTER — Ambulatory Visit (INDEPENDENT_AMBULATORY_CARE_PROVIDER_SITE_OTHER): Payer: PPO | Admitting: Family

## 2016-09-03 ENCOUNTER — Other Ambulatory Visit (INDEPENDENT_AMBULATORY_CARE_PROVIDER_SITE_OTHER): Payer: PPO

## 2016-09-03 ENCOUNTER — Encounter: Payer: Self-pay | Admitting: Family

## 2016-09-03 VITALS — BP 130/80 | HR 75 | Temp 98.3°F | Resp 18 | Ht 69.0 in | Wt 324.0 lb

## 2016-09-03 DIAGNOSIS — Z125 Encounter for screening for malignant neoplasm of prostate: Secondary | ICD-10-CM

## 2016-09-03 DIAGNOSIS — Z0001 Encounter for general adult medical examination with abnormal findings: Secondary | ICD-10-CM | POA: Insufficient documentation

## 2016-09-03 DIAGNOSIS — Z7289 Other problems related to lifestyle: Secondary | ICD-10-CM

## 2016-09-03 DIAGNOSIS — E119 Type 2 diabetes mellitus without complications: Secondary | ICD-10-CM

## 2016-09-03 DIAGNOSIS — Z0000 Encounter for general adult medical examination without abnormal findings: Secondary | ICD-10-CM | POA: Diagnosis not present

## 2016-09-03 DIAGNOSIS — Z23 Encounter for immunization: Secondary | ICD-10-CM | POA: Diagnosis not present

## 2016-09-03 LAB — CBC
HCT: 46.6 % (ref 39.0–52.0)
Hemoglobin: 15.4 g/dL (ref 13.0–17.0)
MCHC: 33.1 g/dL (ref 30.0–36.0)
MCV: 91.3 fl (ref 78.0–100.0)
Platelets: 188 10*3/uL (ref 150.0–400.0)
RBC: 5.11 Mil/uL (ref 4.22–5.81)
RDW: 14.2 % (ref 11.5–15.5)
WBC: 7.4 10*3/uL (ref 4.0–10.5)

## 2016-09-03 LAB — COMPREHENSIVE METABOLIC PANEL
ALT: 30 U/L (ref 0–53)
AST: 25 U/L (ref 0–37)
Albumin: 4.6 g/dL (ref 3.5–5.2)
Alkaline Phosphatase: 61 U/L (ref 39–117)
BUN: 18 mg/dL (ref 6–23)
CHLORIDE: 106 meq/L (ref 96–112)
CO2: 25 meq/L (ref 19–32)
Calcium: 9.9 mg/dL (ref 8.4–10.5)
Creatinine, Ser: 1.25 mg/dL (ref 0.40–1.50)
GFR: 74.12 mL/min (ref >60.00)
GLUCOSE: 146 mg/dL — AB (ref 70–99)
POTASSIUM: 4.3 meq/L (ref 3.5–5.1)
SODIUM: 138 meq/L (ref 135–145)
Total Bilirubin: 1.2 mg/dL (ref 0.2–1.2)
Total Protein: 7.9 g/dL (ref 6.0–8.3)

## 2016-09-03 LAB — LIPID PANEL
Cholesterol: 156 mg/dL (ref 0–200)
HDL: 43.7 mg/dL (ref >39.00)
LDL CALC: 85 mg/dL (ref 0–99)
NONHDL: 111.84
Total CHOL/HDL Ratio: 4
Triglycerides: 132 mg/dL (ref 0.0–149.0)
VLDL: 26.4 mg/dL (ref 0.0–40.0)

## 2016-09-03 LAB — HEPATITIS C ANTIBODY: HCV Ab: NEGATIVE

## 2016-09-03 LAB — HEMOGLOBIN A1C: HEMOGLOBIN A1C: 7.5 % — AB (ref 4.6–6.5)

## 2016-09-03 MED ORDER — COLCHICINE 0.6 MG PO TABS: ORAL_TABLET | ORAL | 2 refills | Status: DC

## 2016-09-03 MED ORDER — PRAVASTATIN SODIUM 40 MG PO TABS: 40.0000 mg | ORAL_TABLET | Freq: Every day | ORAL | 1 refills | Status: DC

## 2016-09-03 MED ORDER — LISINOPRIL 5 MG PO TABS: 5.0000 mg | ORAL_TABLET | Freq: Every day | ORAL | 1 refills | Status: DC

## 2016-09-03 NOTE — Patient Instructions (Addendum)
Thank you for choosing [facility].  SUMMARY AND INSTRUCTIONS:  Recommend weight loss of 5-10% of current body weight through nutrition and physical activity.  Goal physical activity of 20-30 minutes of moderate level activity daily aiming for 10,000 steps per day.  Caloric intake of 1500-1800 cal per day emphasizing protein and lean meats and avoiding saturated fats and processed/sugary foods.  Medication:  Your prescription(s) have been submitted to your pharmacy or been printed and provided for you. Please take as directed and contact our office if you believe you are having problem(s) with the medication(s) or have any questions.  Labs:  Please stop by the lab on the lower level of the building for your blood work. Your results will be released to [facility] (or called to you) after review, usually within 72 hours after test completion. If any changes need to be made, you will be notified at that same time.  1.) The lab is open from 7:30am to 5:30 pm Monday-Friday 2.) No appointment is necessary 3.) Fasting (if needed) is 6-8 hours after food and drink; black coffee and water are okay   Follow up:  If your symptoms worsen or fail to improve, please contact our office for further instruction, or in case of emergency go directly to the emergency room at the closest medical facility.    Health Maintenance  Topic Date Due  . Hepatitis C Screening  [birth year]  . HEMOGLOBIN A1C  02/28/2016  . FOOT EXAM  08/27/2016  . PNA vac Low Risk Adult (2 of 2 - PPSV23) 08/27/2016  . INFLUENZA VACCINE  11/03/2016  . OPHTHALMOLOGY EXAM  02/23/2017  . TETANUS/TDAP  08/27/2025  . COLONOSCOPY  06/02/2026     Health Maintenance, Male A healthy lifestyle and preventive care is important for your health and wellness. Ask your health care provider about what schedule of regular examinations is right for you. What should I know about weight and diet? Eat a Healthy Diet  Eat plenty of  vegetables, fruits, whole grains, low-fat dairy products, and lean protein.  Do not eat a lot of foods high in solid fats, added sugars, or salt.  Maintain a Healthy Weight Regular exercise can help you achieve or maintain a healthy weight. You should:  Do at least 150 minutes of exercise each week. The exercise should increase your heart rate and make you sweat (moderate-intensity exercise).  Do strength-training exercises at least twice a week.  Watch Your Levels of Cholesterol and Blood Lipids  Have your blood tested for lipids and cholesterol every 5 years starting at 67 years of age. If you are at high risk for heart disease, you should start having your blood tested when you are 67 years old. You may need to have your cholesterol levels checked more often if: ? Your lipid or cholesterol levels are high. ? You are older than 67 years of age. ? You are at high risk for heart disease.  What should I know about cancer screening? Many types of cancers can be detected early and may often be prevented. Lung Cancer  You should be screened every year for lung cancer if: ? You are a current smoker who has smoked for at least 30 years. ? You are a former smoker who has quit within the past 15 years.  Talk to your health care provider about your screening options, when you should start screening, and how often you should be screened.  Colorectal Cancer  Routine colorectal cancer screening usually begins  at 67 years of age and should be repeated every 5-10 years until you are 67 years old. You may need to be screened more often if early forms of precancerous polyps or small growths are found. Your health care provider may recommend screening at an earlier age if you have risk factors for colon cancer.  Your health care provider may recommend using home test kits to check for hidden blood in the stool.  A small camera at the end of a tube can be used to examine your colon (sigmoidoscopy or  colonoscopy). This checks for the earliest forms of colorectal cancer.  Prostate and Testicular Cancer  Depending on your age and overall health, your health care provider may do certain tests to screen for prostate and testicular cancer.  Talk to your health care provider about any symptoms or concerns you have about testicular or prostate cancer.  Skin Cancer  Check your skin from head to toe regularly.  Tell your health care provider about any new moles or changes in moles, especially if: ? There is a change in a mole's size, shape, or color. ? You have a mole that is larger than a pencil eraser.  Always use sunscreen. Apply sunscreen liberally and repeat throughout the day.  Protect yourself by wearing long sleeves, pants, a wide-brimmed hat, and sunglasses when outside.  What should I know about heart disease, diabetes, and high blood pressure?  If you are 91-48 years of age, have your blood pressure checked every 3-5 years. If you are 56 years of age or older, have your blood pressure checked every year. You should have your blood pressure measured twice-once when you are at a hospital or clinic, and once when you are not at a hospital or clinic. Record the average of the two measurements. To check your blood pressure when you are not at a hospital or clinic, you can use: ? An automated blood pressure machine at a pharmacy. ? A home blood pressure monitor.  Talk to your health care provider about your target blood pressure.  If you are between 25-57 years old, ask your health care provider if you should take aspirin to prevent heart disease.  Have regular diabetes screenings by checking your fasting blood sugar level. ? If you are at a normal weight and have a low risk for diabetes, have this test once every three years after the age of 32. ? If you are overweight and have a high risk for diabetes, consider being tested at a younger age or more often.  A one-time screening for  abdominal aortic aneurysm (AAA) by ultrasound is recommended for men aged 56-75 years who are current or former smokers. What should I know about preventing infection? Hepatitis B If you have a higher risk for hepatitis B, you should be screened for this virus. Talk with your health care provider to find out if you are at risk for hepatitis B infection. Hepatitis C Blood testing is recommended for:  Everyone born from [birth year] through 1966.  Anyone with known risk factors for hepatitis C.  Sexually Transmitted Diseases (STDs)  You should be screened each year for STDs including gonorrhea and chlamydia if: ? You are sexually active and are younger than 67 years of age. ? You are older than 67 years of age and your health care provider tells you that you are at risk for this type of infection. ? Your sexual activity has changed since you were last screened and you are  at an increased risk for chlamydia or gonorrhea. Ask your health care provider if you are at risk.  Talk with your health care provider about whether you are at high risk of being infected with HIV. Your health care provider may recommend a prescription medicine to help prevent HIV infection.  What else can I do?  Schedule regular health, dental, and eye exams.  Stay current with your vaccines (immunizations).  Do not use any tobacco products, such as cigarettes, chewing tobacco, and e-cigarettes. If you need help quitting, ask your health care provider.  Limit alcohol intake to no more than 2 drinks per day. One drink equals 12 ounces of beer, 5 ounces of wine, or 1 ounces of hard liquor.  Do not use street drugs.  Do not share needles.  Ask your health care provider for help if you need support or information about quitting drugs.  Tell your health care provider if you often feel depressed.  Tell your health care provider if you have ever been abused or do not feel safe at home. This information is not intended to  replace advice given to you by your health care provider. Make sure you discuss any questions you have with your health care provider. Document Released: 09/18/2007 Document Revised: 11/19/2015 Document Reviewed: 12/24/2014 Elsevier Interactive Patient Education  2018 [employer].     Why follow it? Research shows. . Those who follow the Mediterranean diet have a reduced risk of heart disease  . The diet is associated with a reduced incidence of Parkinson's and Alzheimer's diseases . People following the diet may have longer life expectancies and lower rates of chronic diseases  . The Dietary Guidelines for Americans recommends the Mediterranean diet as an eating plan to promote health and prevent disease  What Is the Mediterranean Diet?  . Healthy eating plan based on typical foods and recipes of Mediterranean-style cooking . The diet is primarily a plant based diet; these foods should make up a majority of meals   Starches - Plant based foods should make up a majority of meals - They are an important sources of vitamins, minerals, energy, antioxidants, and fiber - Choose whole grains, foods high in fiber and minimally processed items  - Typical grain sources include wheat, oats, barley, corn, brown rice, bulgar, farro, millet, polenta, couscous  - Various types of beans include chickpeas, lentils, fava beans, black beans, white beans   Fruits  Veggies - Large quantities of antioxidant rich fruits & veggies; 6 or more servings  - Vegetables can be eaten raw or lightly drizzled with oil and cooked  - Vegetables common to the traditional Mediterranean Diet include: artichokes, arugula, beets, broccoli, brussel sprouts, cabbage, carrots, celery, collard greens, cucumbers, eggplant, kale, leeks, lemons, lettuce, mushrooms, okra, onions, peas, peppers, potatoes, pumpkin, radishes, rutabaga, shallots, spinach, sweet potatoes, turnips, zucchini - Fruits common to the Mediterranean Diet include:  apples, apricots, avocados, cherries, clementines, dates, figs, grapefruits, grapes, melons, nectarines, oranges, peaches, pears, pomegranates, strawberries, tangerines  Fats - Replace butter and margarine with healthy oils, such as olive oil, canola oil, and tahini  - Limit nuts to no more than a handful a day  - Nuts include walnuts, almonds, pecans, pistachios, pine nuts  - Limit or avoid candied, honey roasted or heavily salted nuts - Olives are central to the Mediterranean diet - can be eaten whole or used in a variety of dishes   Meats Protein - Limiting red meat: no more than a few times  a month - When eating red meat: choose lean cuts and keep the portion to the size of deck of cards - Eggs: approx. 0 to 4 times a week  - Fish and lean poultry: at least 2 a week  - Healthy protein sources include, chicken, [place], lean beef, lamb - Increase intake of seafood such as tuna, salmon, trout, mackerel, shrimp, scallops - Avoid or limit high fat processed meats such as sausage and bacon  Dairy - Include moderate amounts of low fat dairy products  - Focus on healthy dairy such as fat free yogurt, skim milk, low or reduced fat cheese - Limit dairy products higher in fat such as whole or 2% milk, cheese, ice cream  Alcohol - Moderate amounts of red wine is ok  - No more than 5 oz daily for women (all ages) and men older than age 45  - No more than 10 oz of wine daily for men younger than 22  Other - Limit sweets and other desserts  - Use herbs and spices instead of salt to flavor foods  - Herbs and spices common to the traditional Mediterranean Diet include: basil, bay leaves, chives, cloves, cumin, fennel, garlic, lavender, marjoram, mint, oregano, parsley, pepper, rosemary, sage, savory, sumac, tarragon, thyme   It's not just a diet, it's a lifestyle:  . The Mediterranean diet includes lifestyle factors typical of those in the region  . Foods, drinks and meals are best eaten with others and  savored . Daily physical activity is important for overall good health . This could be strenuous exercise like running and aerobics . This could also be more leisurely activities such as walking, housework, yard-work, or taking the stairs . Moderation is the key; a balanced and healthy diet accommodates most foods and drinks . Consider portion sizes and frequency of consumption of certain foods   Meal Ideas & Options:  . Breakfast:  o Whole wheat toast or whole wheat English muffins with peanut butter & hard boiled egg o Steel cut oats topped with apples & cinnamon and skim milk  o Fresh fruit: banana, strawberries, melon, berries, peaches  o Smoothies: strawberries, bananas, greek yogurt, peanut butter o Low fat greek yogurt with blueberries and granola  o Egg white omelet with spinach and mushrooms o Breakfast couscous: whole wheat couscous, apricots, skim milk, cranberries  . Sandwiches:  o Hummus and grilled vegetables (peppers, zucchini, squash) on whole wheat bread   o Grilled chicken on whole wheat pita with lettuce, tomatoes, cucumbers or tzatziki  o Tuna salad on whole wheat bread: tuna salad made with greek yogurt, olives, red peppers, capers, green onions o Garlic rosemary lamb pita: lamb sauted with garlic, rosemary, salt & pepper; add lettuce, cucumber, greek yogurt to pita - flavor with lemon juice and black pepper  . Seafood:  o Mediterranean grilled salmon, seasoned with garlic, basil, parsley, lemon juice and black pepper o Shrimp, lemon, and spinach whole-grain pasta salad made with low fat greek yogurt  o Seared scallops with lemon orzo  o Seared tuna steaks seasoned salt, pepper, coriander topped with tomato mixture of olives, tomatoes, olive oil, minced garlic, parsley, green onions and cappers  . Meats:  o Herbed greek chicken salad with kalamata olives, cucumber, feta  o Red bell peppers stuffed with spinach, bulgur, lean ground beef (or lentils) & topped with feta    o Kebabs: skewers of chicken, tomatoes, onions, zucchini, squash  o [place] burgers: made with red onions, mint, dill,  lemon juice, feta cheese topped with roasted red peppers . Vegetarian o Cucumber salad: cucumbers, artichoke hearts, celery, red onion, feta cheese, tossed in olive oil & lemon juice  o Hummus and whole grain pita points with a greek salad (lettuce, tomato, feta, olives, cucumbers, red onion) o Lentil soup with celery, carrots made with vegetable broth, garlic, salt and pepper  o Tabouli salad: parsley, bulgur, mint, scallions, cucumbers, tomato, radishes, lemon juice, olive oil, salt and pepper.

## 2016-09-03 NOTE — Progress Notes (Signed)
Subjective:    Patient ID: Roger Alvarado, male    DOB: 1950-10-27, 66 y.o.   MRN: [id number]  Chief Complaint  Patient presents with  . AWV    wants medications refilled, has a 20 year history of gout and would like more than 3 pills sent in if possible, states he is up to date on colonoscopy    HPI:  Roger Alvarado is a 66 y.o. male who presents today for a Medicare Annual Wellness/Physical exam.    1) Health Maintenance -   Diet - Averages about 2 meals per day consisting of a higher fiber diet; Caffeine intake averaging about 1-2 cups per day   Exercise - Walking; 3x per week for about 30-42  2) Preventative Exams / Immunizations:  Dental -- Due for exam   Vision -- Up to date   Health Maintenance  Topic Date Due  . Hepatitis C Screening  [birth year]  . HEMOGLOBIN A1C  02/28/2016  . FOOT EXAM  08/27/2016  . PNA vac Low Risk Adult (2 of 2 - PPSV23) 08/27/2016  . INFLUENZA VACCINE  11/03/2016  . OPHTHALMOLOGY EXAM  02/23/2017  . TETANUS/TDAP  08/27/2025  . COLONOSCOPY  06/02/2026     Immunization History  Administered Date(s) Administered  . Pneumococcal Conjugate-13 08/28/2015  . Pneumococcal Polysaccharide-23 09/03/2016  . Tdap 08/28/2015    RISK FACTORS  Tobacco History  Smoking Status  . Never Smoker  Smokeless Tobacco  . Never Used     Cardiac risk factors: advanced age (older than 70 for men, 2 for women), diabetes mellitus, male gender, obesity (BMI >= 30 kg/m2) and sedentary lifestyle.  Depression Screen  Depression screen PHQ 2/9 09/03/2016  Decreased Interest 0  Down, Depressed, Hopeless 0  PHQ - 2 Score 0     Activities of Daily Living In your present state of health, do you have any difficulty performing the following activities?:  Driving? No Managing money?  No Feeding yourself? No Getting from bed to chair? No Climbing a flight of stairs? No Preparing food and eating?: No Bathing or showering? No Getting dressed:  No Getting to the toilet? No Using the toilet: No Moving around from place to place: No In the past year have you fallen or had a near fall?:No   Home Safety Has smoke detector and wears seat belts.No excess sun exposure. Are there smokers in your home (other than you)?  No Do you feel safe at home?  Yes  Hearing Difficulties: No Do you often ask people to speak up or repeat themselves? No Do you experience ringing or noises in your ears? No  Do you have difficulty understanding soft or whispered voices? No    Cognitive Testing  Alert? Yes   Normal Appearance? Yes  Oriented to person? Yes  Place? Yes   Time? Yes  Recall of three objects?  Yes  Can perform simple calculations? Yes  Displays appropriate judgment? Yes  Can read the correct time from a watch face? Yes  Do you feel that you have a problem with memory? No  Do you often misplace items? No   Advanced Directives have been discussed with the patient? Yes   Current Physicians/Providers and Suppliers  1. Terri Piedra, FNP - Internal Medicine  Indicate any recent Medical Services you may have received from other than [facility] providers in the past year (date may be approximate).  All answers were reviewed with the patient and necessary referrals were made:  [place],  Belenda Cruise, [place]   09/03/2016    No Known Allergies   Outpatient Medications Prior to Visit  Medication Sig Dispense Refill  . aspirin 81 MG tablet Take 81 mg by mouth daily.    Marland Kitchen lisinopril (PRINIVIL,ZESTRIL) 5 MG tablet TAKE 1 TABLET (5 MG TOTAL) BY MOUTH DAILY. 90 tablet 1  . metFORMIN (GLUCOPHAGE) 1000 MG tablet TAKE 1 TABLET TWICE A DAY 60 tablet 0  . pravastatin (PRAVACHOL) 40 MG tablet TAKE 1 TABLET (40 MG TOTAL) BY MOUTH DAILY. OVERDUE FOR FOLLOW-UP APPT W/LABS MUST SEE GREG FOR APPT 30 tablet 0  . colchicine 0.6 MG tablet Take 1 tablet (0.6 mg total) by mouth 2 (two) times daily. 30 tablet 1  . indomethacin (INDOCIN) 50 MG capsule Take 1 capsule (50 mg  total) by mouth 3 (three) times daily with meals. 30 capsule 1   No facility-administered medications prior to visit.      Past Medical History:  Diagnosis Date  . Allergy   . Arthritis   . Asthma    Childhood  . Diabetes mellitus   . Diverticulitis   . Diverticulosis   . Gout   . Gout   . Hyperlipidemia   . Obesity, Class III, BMI 40-49.9 (morbid obesity) ([place])      History reviewed. No pertinent surgical history.   Family History  Problem Relation Age of Onset  . Diabetes Brother   . Hypertension Brother   . Diabetes Brother   . Hypertension Brother   . Diabetes Father   . Hypertension Father   . Diabetes Sister   . Asthma Mother   . Heart attack Maternal Grandfather      Social History   Social History  . Marital status: Married    Spouse name: N/A  . Number of children: 2  . Years of education: 74   Occupational History  . Retired    Social History Main Topics  . Smoking status: Never Smoker  . Smokeless tobacco: Never Used  . Alcohol use 1.2 oz/week    2 Cans of beer per week  . Drug use: No  . Sexual activity: Not on file   Other Topics Concern  . Not on file   Social History Narrative   Fun: Watch sports, gardening      Review of Systems  Constitutional: Denies fever, chills, fatigue, or significant weight gain/loss. HENT: Head: Denies headache or neck pain Ears: Denies changes in hearing, ringing in ears, earache, drainage Nose: Denies discharge, stuffiness, itching, nosebleed, sinus pain Throat: Denies sore throat, hoarseness, dry mouth, sores, thrush Eyes: Denies loss/changes in vision, pain, redness, blurry/double vision, flashing lights Cardiovascular: Denies chest pain/discomfort, tightness, palpitations, shortness of breath with activity, difficulty lying down, swelling, sudden awakening with shortness of breath Respiratory: Denies shortness of breath, cough, sputum production, wheezing Gastrointestinal: Denies dysphasia,  heartburn, change in appetite, nausea, change in bowel habits, rectal bleeding, constipation, diarrhea, yellow skin or eyes Genitourinary: Denies frequency, urgency, burning/pain, blood in urine, incontinence, change in urinary strength. Musculoskeletal: Denies muscle/joint pain, stiffness, back pain, redness or swelling of joints, trauma Skin: Denies rashes, lumps, itching, dryness, color changes, or hair/nail changes Neurological: Denies dizziness, fainting, seizures, weakness, numbness, tingling, tremor Psychiatric - Denies nervousness, stress, depression or memory loss Endocrine: Denies heat or cold intolerance, sweating, frequent urination, excessive thirst, changes in appetite Hematologic: Denies ease of bruising or bleeding    Objective:     BP 130/80 (BP Location: Left Arm, Patient Position: Sitting, Cuff Size:  Large)   Pulse 75   Temp 98.3 F (36.8 C) (Oral)   Resp 18   Ht 5\' 9"  (1.753 m)   Wt (!) 324 lb (147 kg)   SpO2 95%   BMI 47.85 kg/m  Nursing note and vital signs reviewed.   Visual Acuity Screening   Right eye Left eye Both eyes  Without correction: 2025 20/20 20/15  With correction:       Physical Exam  Constitutional: He is oriented to person, place, and time. He appears well-developed and well-nourished. No distress.  HENT:  Head: Normocephalic.  Right Ear: Hearing, tympanic membrane, external ear and ear canal normal.  Left Ear: Hearing, tympanic membrane, external ear and ear canal normal.  Nose: Nose normal.  Mouth/Throat: Uvula is midline, oropharynx is clear and moist and mucous membranes are normal.  Eyes: Conjunctivae and EOM are normal. Pupils are equal, round, and reactive to light.  Neck: Neck supple. No JVD present. No tracheal deviation present. No thyromegaly present.  Cardiovascular: Normal rate, regular rhythm, normal heart sounds and intact distal pulses.   Pulmonary/Chest: Effort normal and breath sounds normal.  Abdominal: Soft. Bowel  sounds are normal. He exhibits no distension and no mass. There is no tenderness. There is no rebound and no guarding.  Musculoskeletal: Normal range of motion. He exhibits no edema or tenderness.  Lymphadenopathy:    He has no cervical adenopathy.  Neurological: He is alert and oriented to person, place, and time. He has normal reflexes. No cranial nerve deficit. He exhibits normal muscle tone. Coordination normal.  Skin: Skin is warm and dry.  Psychiatric: He has a normal mood and affect. His behavior is normal. Judgment and thought content normal.       Assessment & Plan:   During the course of the visit the patient was educated and counseled about appropriate screening and preventive services including:    Pneumococcal vaccine   Td vaccine  Prostate cancer screening  Colorectal cancer screening  Diabetes screening  Glaucoma screening  Nutrition counseling   Diet review for nutrition referral? Yes ____  Not Indicated _X___   Patient Instructions (the written plan) was given to the patient.  Medicare Attestation I have personally reviewed: The patient's medical and social history Their use of alcohol, tobacco or illicit drugs Their current medications and supplements The patient's functional ability including ADLs,fall risks, home safety risks, cognitive, and hearing and visual impairment Diet and physical activities Evidence for depression or mood disorders  The patient's weight, height, BMI,  have been recorded in the chart.  I have made referrals, counseling, and provided education to the patient based on review of the above and I have provided the patient with a written personalized care plan for preventive services.     Problem List Items Addressed This Visit      Endocrine   Type 2 diabetes mellitus (HCC)    Obtain hemoglobin A1c and urine microalbumin. Continue current dosage of metformin pending A1c results. Maintained on lisinopril and pravastatin for CAD  risk reduction. Diabetic foot exam completed today. Pneumovax updated today. Encouraged to monitor blood sugars at home and follow carbohydrate intake. Continue current dosage of metformin.      Relevant Orders   CBC (Completed)   Comprehensive metabolic panel (Completed)   Lipid panel (Completed)   Hemoglobin A1c (Completed)     Other   Morbid obesity (HCC)    BMI of 47.  Recommend weight loss of 5-10% of current body weight.  Recommend increasing physical activity to 30 minutes of moderate level activity daily. Encourage nutritional intake that focuses on nutrient dense foods and is moderate, varied, and balanced and is low in saturated fats and processed/sugary foods. Continue to monitor.        Medicare welcome exam - Primary    Reviewed and updated patient's medical, surgical, family and social history. Medications and allergies were also reviewed. Basic screenings for depression, activities of daily living, hearing, cognition and safety were performed. Provider list was updated and health plan was provided to the patient.   Pneumovax updated today. All other immunizations are up-to-date per recommendations. Obtain hepatitis C antibody for hepatitis C screening. Obtain hemoglobin A1c and urine microalbumin for diabetes screenings. Diabetic foot exam completed today. Due for a dental exam encouraged to be completed independently. Obtain PSA for prostate cancer screening. Colon cancer screening is up-to-date.  Overall well exam with risk factors for cardiovascular disease including type 2 diabetes and morbid obesity. Chronic conditions appear adequately controlled current medication regimen and no adverse side effects. Encourage continue healthy lifestyle behaviors and choices. Goal is to increase physical activity to 30 minutes of moderate level activity 2-3 times per week or approximately 10,000 steps per day. Recommend nutritional intake that is moderate, balance, and varied. Follow-up  prevention exam in 1 year. Follow-up office visit pending blood work and for chronic conditions.       Other Visit Diagnoses    Other problems related to lifestyle       Relevant Orders   Hepatitis C antibody   Screening for prostate cancer       Relevant Orders   PSA, Medicare   Need for 23-polyvalent pneumococcal polysaccharide vaccine       Relevant Orders   Pneumococcal polysaccharide vaccine 23-valent greater than or equal to 2yo subcutaneous/IM (Completed)       I have discontinued Mr. Grabel's indomethacin. I have also changed his colchicine. Additionally, I am having him maintain his aspirin, lisinopril, pravastatin, and metFORMIN.   Meds ordered this encounter  Medications  . colchicine 0.6 MG tablet    Sig: Take 2 tablets by mouth at onset of flare and then 1 tablet 1 hour later.    Dispense:  30 tablet    Refill:  2    Please fill at patient refill request.    Order Specific Question:   Supervising Provider    Answer:   Pricilla Holm A [[id number]]     Follow-up: Return in about 6 months (around 03/05/2017), or if symptoms worsen or fail to improve.   Mauricio Po, FNP

## 2016-09-03 NOTE — Assessment & Plan Note (Signed)
BMI of 47.  Recommend weight loss of 5-10% of current body weight. Recommend increasing physical activity to 30 minutes of moderate level activity daily. Encourage nutritional intake that focuses on nutrient dense foods and is moderate, varied, and balanced and is low in saturated fats and processed/sugary foods. Continue to monitor.

## 2016-09-03 NOTE — Assessment & Plan Note (Signed)
Obtain hemoglobin A1c and urine microalbumin. Continue current dosage of metformin pending A1c results. Maintained on lisinopril and pravastatin for CAD risk reduction. Diabetic foot exam completed today. Pneumovax updated today. Encouraged to monitor blood sugars at home and follow carbohydrate intake. Continue current dosage of metformin.

## 2016-09-03 NOTE — Assessment & Plan Note (Signed)
Reviewed and updated patient's medical, surgical, family and social history. Medications and allergies were also reviewed. Basic screenings for depression, activities of daily living, hearing, cognition and safety were performed. Provider list was updated and health plan was provided to the patient.   Pneumovax updated today. All other immunizations are up-to-date per recommendations. Obtain hepatitis C antibody for hepatitis C screening. Obtain hemoglobin A1c and urine microalbumin for diabetes screenings. Diabetic foot exam completed today. Due for a dental exam encouraged to be completed independently. Obtain PSA for prostate cancer screening. Colon cancer screening is up-to-date.  Overall well exam with risk factors for cardiovascular disease including type 2 diabetes and morbid obesity. Chronic conditions appear adequately controlled current medication regimen and no adverse side effects. Encourage continue healthy lifestyle behaviors and choices. Goal is to increase physical activity to 30 minutes of moderate level activity 2-3 times per week or approximately 10,000 steps per day. Recommend nutritional intake that is moderate, balance, and varied. Follow-up prevention exam in 1 year. Follow-up office visit pending blood work and for chronic conditions.

## 2016-10-25 ENCOUNTER — Other Ambulatory Visit: Payer: Self-pay | Admitting: Family

## 2016-11-23 ENCOUNTER — Ambulatory Visit (INDEPENDENT_AMBULATORY_CARE_PROVIDER_SITE_OTHER): Payer: PPO | Admitting: Family

## 2016-11-23 ENCOUNTER — Telehealth: Payer: Self-pay | Admitting: Family

## 2016-11-23 ENCOUNTER — Encounter: Payer: Self-pay | Admitting: Family

## 2016-11-23 VITALS — BP 122/82 | HR 81 | Temp 98.0°F | Resp 20 | Ht 69.0 in | Wt 319.0 lb

## 2016-11-23 DIAGNOSIS — M545 Low back pain, unspecified: Secondary | ICD-10-CM

## 2016-11-23 MED ORDER — CYCLOBENZAPRINE HCL 10 MG PO TABS: 5.0000 mg | ORAL_TABLET | Freq: Three times a day (TID) | ORAL | 0 refills | Status: DC | PRN

## 2016-11-23 NOTE — Telephone Encounter (Signed)
Patient Name: Roger Alvarado DOB: 1950-04-17 Initial Comment caller states he had gout flare up and having hip and leg pain . should he be seen in the office ? Nurse Assessment Nurse: Joline Salt, RN, Malachy Mood Date/Time Eilene Ghazi Time): 11/23/2016 12:01:59 PM Confirm and document reason for call. If symptomatic, describe symptoms. ---Caller states he had gout flare up and having hip and leg pain . should he be seen in the office ? The gout has nearly cleared up due to taking medication for gout but the hip is worse. The hip felt like a pulled muscle at first. Does the patient have any new or worsening symptoms? ---Yes Will a triage be completed? ---Yes Related visit to physician within the last 2 weeks? ---No Does the PT have any chronic conditions? (i.e. diabetes, asthma, etc.) ---Yes List chronic conditions. ---diabetes Is this a behavioral health or substance abuse call? ---No Guidelines Guideline Title Affirmed Question Affirmed Notes Hip Pain [1] MODERATE pain (e.g., interferes with normal activities, limping) AND [2] present > 3 days Final Disposition User See PCP When Office is Open (within 3 days) Joline Salt, Therapist, sports, [place] Comments Left hip pain. He was standing and the more he moved the worse it got since last Friday, 11/19/16. Appt scheduled with PCP for 1:30 today. Referrals REFERRED TO PCP OFFICE Disagree/Comply: Comply

## 2016-11-23 NOTE — Progress Notes (Signed)
Subjective:    Patient ID: Roger Alvarado, male    DOB: June 08, 1950, 66 y.o.   MRN: [id number]  Chief Complaint  Patient presents with  . Back Pain    the lower left part of his back has been bothering him x5 days, does not recall doing anything that could cause pain    HPI:  Roger Alvarado is a 66 y.o. male who  has a past medical history of Allergy; Arthritis; Asthma; Diabetes mellitus; Diverticulitis; Diverticulosis; Gout; Gout; Hyperlipidemia; and Obesity, Class III, BMI 40-49.9 (morbid obesity) ([place]). and presents today for an acute office visit.  This is a new problem. Associated symptoms of pain located on the left side of his lower back and hip that have been going on for about 5 days. Denies any trauma or injury that he is aware of. Pain is described as dull and aggravated with movement especially with change of position. Hard back chairs seem to help with the pain. Generally constant. No radiculopathy. Modifying factors include acetaminophen which has helped a little. No fevers, chills, change in bowel habits or saddle anesthesia.   No Known Allergies    Outpatient Medications Prior to Visit  Medication Sig Dispense Refill  . aspirin 81 MG tablet Take 81 mg by mouth daily.    . colchicine 0.6 MG tablet Take 2 tablets by mouth at onset of flare and then 1 tablet 1 hour later. 30 tablet 2  . lisinopril (PRINIVIL,ZESTRIL) 5 MG tablet Take 1 tablet (5 mg total) by mouth daily. 90 tablet 1  . metFORMIN (GLUCOPHAGE) 1000 MG tablet TAKE 1 TABLET TWICE A DAY 60 tablet 5  . pravastatin (PRAVACHOL) 40 MG tablet Take 1 tablet (40 mg total) by mouth daily. 90 tablet 1   No facility-administered medications prior to visit.       No past surgical history on file.    Past Medical History:  Diagnosis Date  . Allergy   . Arthritis   . Asthma    Childhood  . Diabetes mellitus   . Diverticulitis   . Diverticulosis   . Gout   . Gout   . Hyperlipidemia   . Obesity, Class  III, BMI 40-49.9 (morbid obesity) ([place])       Review of Systems  Constitutional: Negative for chills and fever.  Respiratory: Negative for chest tightness and shortness of breath.   Cardiovascular: Negative for chest pain, palpitations and leg swelling.  Musculoskeletal: Positive for back pain.  Neurological: Negative for weakness and numbness.      Objective:    BP 122/82 (BP Location: Left Arm, Patient Position: Sitting, Cuff Size: Large)   Pulse 81   Temp 98 F (36.7 C) (Oral)   Resp 20   Ht 5\' 9"  (1.753 m)   Wt (!) 319 lb (144.7 kg)   SpO2 97%   BMI 47.11 kg/m  Nursing note and vital signs reviewed.  Physical Exam  Constitutional: He is oriented to person, place, and time. He appears well-developed and well-nourished. No distress.  Cardiovascular: Normal rate, regular rhythm, normal heart sounds and intact distal pulses.   Pulmonary/Chest: Effort normal and breath sounds normal.  Musculoskeletal:  Low back - obese. No obvious deformity, discoloration, or edema. Tenderness elicited over lower lumbar paraspinal musculature and piriformis. Range of motion restricted in flexion and lateral bending to the left. Distal pulses and sensation are intact and appropriate. Straight leg raises negative. Unable to complete Faber's.  Neurological: He is alert and oriented  to person, place, and time.  Skin: Skin is warm and dry.  Psychiatric: He has a normal mood and affect. His behavior is normal. Judgment and thought content normal.       Assessment & Plan:   Problem List Items Addressed This Visit      Other   Acute left-sided low back pain without sciatica - Primary    Symptoms and exam consistent with piriformis and lumbar paraspinal muscle spasms. Start Flexeril as needed for muscle spasms. Treat conservatively with ice, moist heat, and home exercise therapy. Follow-up if symptoms worsen or do not improve. Consider imaging and physical therapy as needed.      Relevant  Medications   cyclobenzaprine (FLEXERIL) 10 MG tablet       I am having Mr. Blecher start on cyclobenzaprine. I am also having him maintain his aspirin, colchicine, pravastatin, lisinopril, and metFORMIN.   Meds ordered this encounter  Medications  . cyclobenzaprine (FLEXERIL) 10 MG tablet    Sig: Take 0.5-1 tablets (5-10 mg total) by mouth 3 (three) times daily as needed for muscle spasms.    Dispense:  60 tablet    Refill:  0    Order Specific Question:   Supervising Provider    Answer:   Pricilla Holm A [[id number]]     Follow-up: Return if symptoms worsen or fail to improve.  Mauricio Po, FNP

## 2016-11-23 NOTE — Patient Instructions (Addendum)
Thank you for choosing [facility].  SUMMARY AND INSTRUCTIONS:  Ice and moist heat x 20 minutes every 2 hours and after activities.   Stretches and activities throughout the day.   Flexeril as needed for muscle spasm.   Ibuprofen 200-600 mg 3 times daily and then as needed.   Follow up if symptoms worsen or do not improve.   Medication:  Your prescription(s) have been submitted to your pharmacy or been printed and provided for you. Please take as directed and contact our office if you believe you are having problem(s) with the medication(s) or have any questions.  Follow up:  If your symptoms worsen or fail to improve, please contact our office for further instruction, or in case of emergency go directly to the emergency room at the closest medical facility.

## 2016-11-23 NOTE — Assessment & Plan Note (Signed)
Symptoms and exam consistent with piriformis and lumbar paraspinal muscle spasms. Start Flexeril as needed for muscle spasms. Treat conservatively with ice, moist heat, and home exercise therapy. Follow-up if symptoms worsen or do not improve. Consider imaging and physical therapy as needed.

## 2016-12-16 DIAGNOSIS — N2889 Other specified disorders of kidney and ureter: Secondary | ICD-10-CM | POA: Diagnosis not present

## 2016-12-16 DIAGNOSIS — N179 Acute kidney failure, unspecified: Secondary | ICD-10-CM | POA: Diagnosis not present

## 2016-12-16 DIAGNOSIS — E861 Hypovolemia: Secondary | ICD-10-CM | POA: Diagnosis not present

## 2016-12-16 DIAGNOSIS — R3129 Other microscopic hematuria: Secondary | ICD-10-CM | POA: Diagnosis not present

## 2016-12-16 DIAGNOSIS — K449 Diaphragmatic hernia without obstruction or gangrene: Secondary | ICD-10-CM | POA: Diagnosis not present

## 2016-12-16 DIAGNOSIS — T39395A Adverse effect of other nonsteroidal anti-inflammatory drugs [NSAID], initial encounter: Secondary | ICD-10-CM | POA: Diagnosis not present

## 2016-12-16 DIAGNOSIS — K5792 Diverticulitis of intestine, part unspecified, without perforation or abscess without bleeding: Secondary | ICD-10-CM | POA: Diagnosis not present

## 2016-12-16 DIAGNOSIS — Z6841 Body Mass Index (BMI) 40.0 and over, adult: Secondary | ICD-10-CM | POA: Diagnosis not present

## 2016-12-16 DIAGNOSIS — I9589 Other hypotension: Secondary | ICD-10-CM | POA: Diagnosis not present

## 2016-12-16 DIAGNOSIS — K922 Gastrointestinal hemorrhage, unspecified: Secondary | ICD-10-CM | POA: Diagnosis not present

## 2016-12-16 DIAGNOSIS — K5732 Diverticulitis of large intestine without perforation or abscess without bleeding: Secondary | ICD-10-CM | POA: Diagnosis not present

## 2016-12-16 DIAGNOSIS — E7800 Pure hypercholesterolemia, unspecified: Secondary | ICD-10-CM | POA: Diagnosis not present

## 2016-12-16 DIAGNOSIS — N189 Chronic kidney disease, unspecified: Secondary | ICD-10-CM | POA: Diagnosis not present

## 2016-12-16 DIAGNOSIS — R031 Nonspecific low blood-pressure reading: Secondary | ICD-10-CM | POA: Diagnosis not present

## 2016-12-16 DIAGNOSIS — E1165 Type 2 diabetes mellitus with hyperglycemia: Secondary | ICD-10-CM | POA: Diagnosis not present

## 2016-12-16 DIAGNOSIS — K921 Melena: Secondary | ICD-10-CM | POA: Diagnosis not present

## 2016-12-16 DIAGNOSIS — I129 Hypertensive chronic kidney disease with stage 1 through stage 4 chronic kidney disease, or unspecified chronic kidney disease: Secondary | ICD-10-CM | POA: Diagnosis not present

## 2016-12-16 DIAGNOSIS — R109 Unspecified abdominal pain: Secondary | ICD-10-CM | POA: Diagnosis not present

## 2016-12-16 DIAGNOSIS — R55 Syncope and collapse: Secondary | ICD-10-CM | POA: Diagnosis not present

## 2016-12-16 DIAGNOSIS — E1122 Type 2 diabetes mellitus with diabetic chronic kidney disease: Secondary | ICD-10-CM | POA: Diagnosis not present

## 2016-12-27 DIAGNOSIS — K5792 Diverticulitis of intestine, part unspecified, without perforation or abscess without bleeding: Secondary | ICD-10-CM | POA: Diagnosis not present

## 2017-02-10 ENCOUNTER — Ambulatory Visit: Payer: PPO | Admitting: Family Medicine

## 2017-02-10 ENCOUNTER — Encounter: Payer: Self-pay | Admitting: Family Medicine

## 2017-02-10 VITALS — BP 132/74 | HR 90 | Temp 98.5°F | Ht 69.0 in | Wt 319.0 lb

## 2017-02-10 DIAGNOSIS — M25512 Pain in left shoulder: Secondary | ICD-10-CM
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
## 2017-02-10 NOTE — Patient Instructions (Addendum)
Thank you for coming in,   Please use the muscle relaxer.   Please try ice and/or heat.   Please try the exercises once the pain seems to be about a 2/10.    Please feel free to call with any questions or concerns at any time, at [phone]. --Dr. Raeford Razor

## 2017-02-21 ENCOUNTER — Other Ambulatory Visit (INDEPENDENT_AMBULATORY_CARE_PROVIDER_SITE_OTHER): Payer: PPO

## 2017-02-21 ENCOUNTER — Ambulatory Visit (INDEPENDENT_AMBULATORY_CARE_PROVIDER_SITE_OTHER)
Admission: RE | Admit: 2017-02-21 | Discharge: 2017-02-21 | Disposition: A | Discharge status: Home / Self Care | Payer: PPO | Source: Ambulatory Visit | Attending: Nurse Practitioner | Admitting: Nurse Practitioner

## 2017-02-21 ENCOUNTER — Ambulatory Visit: Payer: PPO | Admitting: Nurse Practitioner

## 2017-02-21 ENCOUNTER — Encounter: Payer: Self-pay | Admitting: Nurse Practitioner

## 2017-02-21 VITALS — BP 116/70 | HR 79 | Temp 98.5°F | Resp 16 | Ht 69.0 in | Wt 322.8 lb

## 2017-02-21 DIAGNOSIS — M109 Gout, unspecified: Secondary | ICD-10-CM

## 2017-02-21 DIAGNOSIS — S4992XA Unspecified injury of left shoulder and upper arm, initial encounter: Secondary | ICD-10-CM | POA: Diagnosis not present

## 2017-02-21 DIAGNOSIS — M25512 Pain in left shoulder: Secondary | ICD-10-CM

## 2017-02-21 DIAGNOSIS — E118 Type 2 diabetes mellitus with unspecified complications: Secondary | ICD-10-CM

## 2017-02-21 LAB — BASIC METABOLIC PANEL
BUN: 19 mg/dL (ref 6–23)
CO2: 27 mEq/L (ref 19–32)
CREATININE: 1.25 mg/dL (ref 0.40–1.50)
Calcium: 9.5 mg/dL (ref 8.4–10.5)
Chloride: 106 mEq/L (ref 96–112)
GFR: 74.01 mL/min (ref >60.00)
Glucose, Bld: 151 mg/dL — ABNORMAL HIGH (ref 70–99)
Potassium: 4.6 mEq/L (ref 3.5–5.1)
Sodium: 140 mEq/L (ref 135–145)

## 2017-02-21 LAB — HEMOGLOBIN A1C: Hgb A1c MFr Bld: 6.5 % (ref 4.6–6.5)

## 2017-02-21 MED ORDER — COLCHICINE 0.6 MG PO TABS: ORAL_TABLET | ORAL | 2 refills | Status: DC

## 2017-02-21 NOTE — Assessment & Plan Note (Signed)
Gout well controlled on colchicine with no adverse effects. Will refill colchicine today. Continue to monitor

## 2017-02-21 NOTE — Patient Instructions (Signed)
Please head downstairs for lab work/x-rays.  Id recommend scheduling an appointment with Dr Tamala Julian, our sports medicine doctor, for further evaluation of your shoulder pain, but completely up to you.  Id like to see you back in 6 months, or sooner if you need me.  It was nice to meet you. Thanks for letting me take care of you today :)
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
## 2017-02-21 NOTE — Assessment & Plan Note (Signed)
Managed on metformin for diabetes, lisinopril for kidney protection. Reports daily medication compliance. Denies adverse medication effects of medications. Does not check blood sugars at home. Last A1c normal at 7.5 on 6/1. Basic metabolic panel, Hemoglobin A1c today. Will maintain medications at current dosages pending lab results.
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
## 2017-03-09 ENCOUNTER — Ambulatory Visit: Payer: Self-pay

## 2017-03-09 ENCOUNTER — Encounter: Payer: Self-pay | Admitting: Family Medicine

## 2017-03-09 ENCOUNTER — Ambulatory Visit: Payer: PPO | Admitting: Family Medicine

## 2017-03-09 VITALS — BP 106/72 | HR 80 | Ht 69.0 in | Wt 321.0 lb

## 2017-03-09 DIAGNOSIS — M67912 Unspecified disorder of synovium and tendon, left shoulder: Secondary | ICD-10-CM | POA: Diagnosis not present

## 2017-03-09 DIAGNOSIS — M25512 Pain in left shoulder: Secondary | ICD-10-CM | POA: Diagnosis not present

## 2017-03-09 MED ORDER — VITAMIN D (ERGOCALCIFEROL) 1.25 MG (50000 UNIT) PO CAPS: 50000.0000 [IU] | ORAL_CAPSULE | ORAL | 0 refills | Status: DC

## 2017-03-09 NOTE — Patient Instructions (Addendum)
Good to see you.  Ice 20 minutes 2 times daily. Usually after activity and before bed. Exercises 3 times a week.  Keep hands within peripheral vision.  Once weekly vitamin D for 12 weeks No yard work  See me again in 4 weeks  Happy holidays!

## 2017-03-09 NOTE — Assessment & Plan Note (Signed)
Patient does have a small partial tear but no significant retraction.  Seems to be sitting where it supposed to.  Patient does have what of his pain seems to be possible compression fracture noted.  Starting once weekly vitamin D, topical anti-inflammatories of Artie been given, patient will be sent to physical therapy.  Patient will come back and see me again in 4 weeks.  If no improvement consider injection

## 2017-03-17 ENCOUNTER — Other Ambulatory Visit: Payer: Self-pay

## 2017-03-17 DIAGNOSIS — E119 Type 2 diabetes mellitus without complications: Secondary | ICD-10-CM

## 2017-03-17 MED ORDER — METFORMIN HCL 1000 MG PO TABS: 1000.0000 mg | ORAL_TABLET | Freq: Two times a day (BID) | ORAL | 1 refills | Status: DC

## 2017-03-17 MED ORDER — LISINOPRIL 5 MG PO TABS: 5.0000 mg | ORAL_TABLET | Freq: Every day | ORAL | 1 refills | Status: DC

## 2017-03-17 MED ORDER — PRAVASTATIN SODIUM 40 MG PO TABS: 40.0000 mg | ORAL_TABLET | Freq: Every day | ORAL | 1 refills | Status: DC

## 2017-03-24 ENCOUNTER — Ambulatory Visit: Payer: PPO | Attending: Family Medicine | Admitting: Rehabilitation

## 2017-03-24 ENCOUNTER — Encounter: Payer: Self-pay | Admitting: Rehabilitation

## 2017-03-24 DIAGNOSIS — M25512 Pain in left shoulder: Secondary | ICD-10-CM | POA: Diagnosis not present
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
## 2017-03-30 ENCOUNTER — Ambulatory Visit: Payer: PPO | Admitting: Physical Therapy

## 2017-03-30 DIAGNOSIS — M25512 Pain in left shoulder: Secondary | ICD-10-CM | POA: Diagnosis not present

## 2017-03-30 NOTE — Therapy (Signed)
[place], [name], [address] Phone: [phone]   Fax:  [phone]  Physical Therapy Treatment  Patient Details  Name: Roger Alvarado MRN: [id number] Date of Birth: [birth year] Referring Provider: Hulan Saas, DO   Encounter Date: 03/30/2017  PT End of Session - 03/30/17 1342    Visit Number  2    Number of Visits  8    Date for PT Re-Evaluation  04/21/17    Authorization Type  MCD - 10th visit progress, KX 15 visit    PT Start Time  1331    PT Stop Time  1415    PT Time Calculation (min)  44 min    Activity Tolerance  Patient tolerated treatment well    Behavior During Therapy  [facility] for tasks assessed/performed       Past Medical History:  Diagnosis Date  . Allergy   . Arthritis   . Asthma    Childhood  . Diabetes mellitus   . Diverticulitis   . Diverticulosis   . Gout   . Gout   . Hyperlipidemia   . Obesity, Class III, BMI 40-49.9 (morbid obesity) ([place])     No past surgical history on file.  There were no vitals filed for this visit.  Subjective Assessment - 03/30/17 1334    Subjective  Patient reports his shoulder has felt a little better. he has had some pain while walking and feeding his dog. Hefeels like his pec stretch has been helping him.     Limitations  Sitting    Diagnostic tests  diagnostic US showing; Subacromial bursitis, mild partial tear of the rotator cuff but no retraction, and healing compression fracture    Patient Stated Goals  decrease the pain and improve the strength    Currently in Pain?  Yes    Pain Score  4     Pain Location  Shoulder    Pain Orientation  Left    Pain Descriptors / Indicators  Aching    Pain Type  Acute pain    Pain Onset  More than a month ago    Pain Frequency  Constant    Aggravating Factors   most movements    Pain Relieving Factors  rest, hot shower     Effect of Pain on Daily Activities  able to do activity with pain                        [facility] Adult PT Treatment/Exercise - 03/30/17 0001      Shoulder Exercises: Sidelying   External Rotation Limitations  2x10       Shoulder Exercises: Standing   Theraband Level (Shoulder Extension)  Level 1 (Yellow)    Extension Limitations  2x10     Theraband Level (Shoulder Retraction)  Level 1 (Yellow)    Retraction Limitations  1x10 minor pain around is colar bone.       Modalities   Modalities  Iontophoresis      Iontophoresis   Type of Iontophoresis  Dexamethasone    Location  left anteriro shoulder     Dose  1cc     Time  slow release       Manual Therapy   Manual therapy comments  gentle grade I and II PA and AP glides to improve flexion. No pain after mobilization. STM to upper trap and posterior shoulder.  PT Education - 03/30/17 1342    Education provided  Yes    Education Details  reviewed HEP; added light strengthening     Person(s) Educated  Patient    Methods  Explanation;Demonstration;Tactile cues;Verbal cues    Comprehension  Verbalized understanding;Returned demonstration;Verbal cues required;Tactile cues required          PT Long Term Goals - 03/24/17 0953      PT LONG TERM GOAL #1   Title  Pt will improve L shoulder AROM to WNL and painfree to return to baseline of no pain    Time  4    Period  Weeks    Status  New    Target Date  04/21/17      PT LONG TERM GOAL #2   Title  Pt will return to donning his coat without shoulder pain    Time  4    Period  Weeks    Status  New    Target Date  04/21/16      PT LONG TERM GOAL #3   Title  Pt will lift a gallon of milk with the R UE without increased pain    Time  4    Period  Weeks    Status  New    Target Date  04/21/16      PT LONG TERM GOAL #4   Title  Pt will improve FOTO to 72% functional score or greater    Time  4    Period  Weeks    Status  New    Target Date  04/21/16            Plan - 03/30/17 1427    Clinical  Impression Statement  Added ther-ex today. Therapy adde scapular strengthening and light rotator cuff strengthening he had no increase in pain. he had some pain with active flexion prior to manual therapy but improved after treatment. Light mobilizations used 2nd to compression fracture. Therapy will continue to progress exercises as tolerated.     Clinical Presentation  Stable    Clinical Decision Making  Low    Rehab Potential  Excellent    PT Frequency  2x / week    PT Duration  4 weeks    PT Treatment/Interventions  Electrical Stimulation;Iontophoresis 4mg /ml Dexamethasone;Moist Heat;Therapeutic exercise;Ultrasound;Neuromuscular re-education;Patient/family education;Manual techniques;Taping;Dry needling;Passive range of motion    PT Next Visit Plan  add t-band IR. Continue manual therapy. Progress strengthening as tolerated. consider UE ranger. assess benefits of ionto continue ionto PRN     PT Home Exercise Plan  given intial handout 12/20    Consulted and Agree with Plan of Care  Patient       Patient will benefit from skilled therapeutic intervention in order to improve the following deficits and impairments:  Decreased activity tolerance, Pain, Impaired UE functional use  Visit Diagnosis: Acute pain of left shoulder     Problem List Patient Active Problem List   Diagnosis Date Noted  . Rotator cuff disorder, left 03/09/2017  . Acute pain of left shoulder 02/10/2017  . Acute left-sided low back pain without sciatica 11/23/2016  . Medicare welcome exam 09/03/2016  . Type 2 diabetes mellitus ([place]) 08/28/2015  . Gout 08/28/2015  . Morbid obesity ([place]) 08/28/2015  . Seasonal allergies 08/28/2015    Carney Living PT DPT  03/30/2017, 3:42 PM  [facility] [address] [place], [name], [address] Phone: [phone]   Fax:  [phone]  Name:  Roger Alvarado MRN: [id number] Date of Birth: July 31, 1950

## 2017-03-30 NOTE — Patient Instructions (Signed)

## 2017-03-31 ENCOUNTER — Ambulatory Visit: Payer: PPO | Admitting: Physical Therapy

## 2017-03-31 DIAGNOSIS — M25512 Pain in left shoulder: Secondary | ICD-10-CM

## 2017-03-31 NOTE — Therapy (Signed)
[name], [name], [address] Phone: [phone]   Fax:  [phone]  Physical Therapy Treatment  Patient Details  Name: Roger Alvarado MRN: [id number] Date of Birth: 1950/11/16 Referring Provider: Hulan Saas, DO   Encounter Date: 03/31/2017  PT End of Session - 03/31/17 1334    Visit Number  3    Number of Visits  8    Date for PT Re-Evaluation  04/21/17    Authorization Type  MCD - 10th visit progress, KX 15 visit    PT Start Time  1335    PT Stop Time  1414    PT Time Calculation (min)  39 min    Activity Tolerance  Patient tolerated treatment well    Behavior During Therapy  [facility] for tasks assessed/performed       Past Medical History:  Diagnosis Date  . Allergy   . Arthritis   . Asthma    Childhood  . Diabetes mellitus   . Diverticulitis   . Diverticulosis   . Gout   . Gout   . Hyperlipidemia   . Obesity, Class III, BMI 40-49.9 (morbid obesity) ([place])     No past surgical history on file.  There were no vitals filed for this visit.  Subjective Assessment - 03/31/17 1335    Subjective  Pt reports his shoulder feels a little better than yesterday; he feels the ionto patch helped. He didn't have any skin irritation, "just a little itchy at first"    Patient Stated Goals  decrease the pain and improve the strength    Currently in Pain?  Yes    Pain Score  3     Pain Location  Shoulder    Pain Orientation  Left    Pain Descriptors / Indicators  Aching;Dull    Aggravating Factors   reaching across chest or overhead    Pain Relieving Factors  rest, hot shower         [facility] PT Assessment - 03/31/17 0001      Assessment   Medical Diagnosis  L shoulder pain    Referring Provider  Hulan Saas, DO    Onset Date/Surgical Date  02/09/17    Hand Dominance  Right    Next MD Visit  04/09/17       [facility] Adult PT Treatment/Exercise - 03/31/17 0001      Shoulder Exercises: Supine    Horizontal ABduction  Strengthening;Both;10 reps;Theraband    Theraband Level (Shoulder Horizontal ABduction)  Level 1 (Yellow)    Other Supine Exercises  D2 flexion/sash with yellow band, LUE x 20 reps       Shoulder Exercises: Seated   Extension  --    Theraband Level (Shoulder Extension)  --    Row  Both;10 reps;Theraband 2 sets    Theraband Level (Shoulder Row)  Level 1 (Yellow) 3 sec pause when retracted    External Rotation  Both;10 reps;Theraband 2 sets    Theraband Level (Shoulder External Rotation)  Level 1 (Yellow)      Shoulder Exercises: Standing   Extension  Strengthening;Both;20 reps;Theraband    Theraband Level (Shoulder Extension)  Level 1 (Yellow)      Shoulder Exercises: Stretch   Other Shoulder Stretches  3 position doorway stretch (for pecs) x 15 sec x 2 reps each position.       Modalities   Modalities  Iontophoresis      Iontophoresis   Type of Iontophoresis  Dexamethasone    Location  Lt [place] joint    Dose  1.0 cc    Time  80 mA, 6 hr patch      Manual Therapy   Manual Therapy  Soft tissue mobilization    Soft tissue mobilization  STM to Lt pec major, infraspinatus, teres maj, levator, upper trap      Neck Exercises: Stretches   Upper Trapezius Stretch  2 reps;20 seconds    Other Neck Stretches  scalene stretch              PT Education - 03/31/17 1421    Education provided  Yes    Education Details  Pt issued sample of biofreeze for pain relief; instructed on use.     Person(s) Educated  Patient    Methods  Explanation    Comprehension  Verbalized understanding          PT Long Term Goals - 03/31/17 1420      PT LONG TERM GOAL #1   Title  Pt will improve L shoulder AROM to WNL and painfree to return to baseline of no pain    Time  4    Period  Weeks    Status  On-going improving      PT LONG TERM GOAL #2   Title  Pt will return to donning his coat without shoulder pain    Time  4    Period  Weeks    Status  On-going      PT  LONG TERM GOAL #3   Title  Pt will lift a gallon of milk with the R UE without increased pain    Time  4    Period  Weeks    Status  On-going      PT LONG TERM GOAL #4   Time  4    Period  Weeks    Status  On-going            Plan - 03/31/17 1415    Clinical Impression Statement  Pt very point tender in Lt [place] joint, as well as along rib attachment to sternum during manual therapy. Pt tolerated all exercises well, without increase in pain; required minor cues for posture during session.  Pt may benefit from joint mobs to gentle clavicular mobs next visit.  Progressing towards goals.     Rehab Potential  Excellent    PT Frequency  2x / week    PT Duration  4 weeks    PT Treatment/Interventions  Electrical Stimulation;Iontophoresis 4mg /ml Dexamethasone;Moist Heat;Therapeutic exercise;Ultrasound;Neuromuscular re-education;Patient/family education;Manual techniques;Taping;Dry needling;Passive range of motion    PT Next Visit Plan  Instruct in self massage to Lt pec and posterior shoulder girdle; clavical mobs, progress HEP.     Consulted and Agree with Plan of Care  Patient       Patient will benefit from skilled therapeutic intervention in order to improve the following deficits and impairments:  Decreased activity tolerance, Pain, Impaired UE functional use  Visit Diagnosis: Acute pain of left shoulder     Problem List Patient Active Problem List   Diagnosis Date Noted  . Rotator cuff disorder, left 03/09/2017  . Acute pain of left shoulder 02/10/2017  . Acute left-sided low back pain without sciatica 11/23/2016  . Medicare welcome exam 09/03/2016  . Type 2 diabetes mellitus ([place]) 08/28/2015  . Gout 08/28/2015  . Morbid obesity ([place]) 08/28/2015  . Seasonal allergies 08/28/2015   Kerin Perna, PTA 03/31/17 2:23 PM  [place], [name], [address] Phone: [phone]   Fax:   [phone]  Name: Roger Alvarado MRN: [id number] Date of Birth: Aug 28, 1950

## 2017-04-06 ENCOUNTER — Ambulatory Visit: Payer: PPO | Attending: Family Medicine | Admitting: Physical Therapy

## 2017-04-06 ENCOUNTER — Encounter: Payer: Self-pay | Admitting: Physical Therapy

## 2017-04-06 DIAGNOSIS — M25512 Pain in left shoulder: Secondary | ICD-10-CM | POA: Diagnosis not present

## 2017-04-06 NOTE — Therapy (Signed)
[place], [name], [address] Phone: [phone]   Fax:  [phone]  Physical Therapy Treatment  Patient Details  Name: Roger Alvarado MRN: [id number] Date of Birth: 1950-04-16 Referring Provider: Hulan Saas, DO   Encounter Date: 04/06/2017  PT End of Session - 04/06/17 1513    Visit Number  4    Number of Visits  8    Date for PT Re-Evaluation  04/21/17    Authorization Type  MCD - 10th visit progress, KX 15 visit    PT Start Time  1500    PT Stop Time  1543    PT Time Calculation (min)  43 min    Activity Tolerance  Patient tolerated treatment well    Behavior During Therapy  [facility] for tasks assessed/performed       Past Medical History:  Diagnosis Date  . Allergy   . Arthritis   . Asthma    Childhood  . Diabetes mellitus   . Diverticulitis   . Diverticulosis   . Gout   . Gout   . Hyperlipidemia   . Obesity, Class III, BMI 40-49.9 (morbid obesity) ([place])     History reviewed. No pertinent surgical history.  There were no vitals filed for this visit.  Subjective Assessment - 04/06/17 1500    Subjective  Patient feels like his shoulder is improving. He is having less painsround his colar bone. Today he is having pain in his anterior shoulder but it is not too bad.     Pertinent History  DM, obesity, gout    Limitations  Sitting    Diagnostic tests  diagnostic US showing; Subacromial bursitis, mild partial tear of the rotator cuff but no retraction, and healing compression fracture    Patient Stated Goals  decrease the pain and improve the strength    Currently in Pain?  Yes    Pain Score  2     Pain Location  Shoulder    Pain Orientation  Left    Pain Descriptors / Indicators  Aching    Pain Onset  More than a month ago    Pain Frequency  Constant    Aggravating Factors   reaching overhead     Pain Relieving Factors  rest     Effect of Pain on Daily Activities  pain when reaching                        [facility] Adult PT Treatment/Exercise - 04/06/17 0001      Shoulder Exercises: Supine   Horizontal ABduction  Strengthening;Both;10 reps;Theraband 2x10     Theraband Level (Shoulder Horizontal ABduction)  Level 1 (Yellow)      Shoulder Exercises: Seated   External Rotation  Both;10 reps;Theraband 2 sets    Theraband Level (Shoulder External Rotation)  Level 1 (Yellow)    Internal Rotation  -- 2x10 \      Shoulder Exercises: Sidelying   External Rotation Limitations  2x10       Shoulder Exercises: Standing   Internal Rotation  Theraband;10 reps    Theraband Level (Shoulder Internal Rotation)  Level 1 (Yellow)    Extension  Strengthening;Both;20 reps;Theraband    Theraband Level (Shoulder Extension)  Level 1 (Yellow)    Extension Limitations  2x10     Theraband Level (Shoulder Retraction)  Level 1 (Yellow)    Retraction Limitations  2x10 yellow       Shoulder Exercises:  Stretch   Other Shoulder Stretches  3 position doorway stretch (for pecs) x 15 sec x 2 reps each position.       Iontophoresis   Type of Iontophoresis  Dexamethasone    Location  Lt [place] joint    Dose  1.0 cc    Time  80 mA, 6 hr patch      Manual Therapy   Manual Therapy  Soft tissue mobilization    Soft tissue mobilization  STM to Lt pec major, infraspinatus, teres maj, levator, upper trap      Neck Exercises: Stretches   Upper Trapezius Stretch  2 reps;20 seconds    Other Neck Stretches  scalene stretch              PT Education - 04/06/17 1508    Education provided  Yes    Education Details  Reviewed HEP and technique     Person(s) Educated  Patient    Methods  Explanation;Demonstration;Tactile cues;Verbal cues    Comprehension  Verbalized understanding;Returned demonstration;Verbal cues required;Tactile cues required          PT Long Term Goals - 04/06/17 1643      PT LONG TERM GOAL #1   Title  Pt will improve L shoulder AROM to WNL and painfree to  return to baseline of no pain    Time  4    Period  Weeks    Status  On-going      PT LONG TERM GOAL #2   Title  Pt will return to donning his coat without shoulder pain    Time  4    Period  Weeks    Status  On-going      PT LONG TERM GOAL #3   Title  Pt will lift a gallon of milk with the R UE without increased pain    Time  4    Period  Weeks    Status  On-going      PT LONG TERM GOAL #4   Title  Pt will improve FOTO to 72% functional score or greater    Time  4    Period  Weeks    Status  On-going            Plan - 04/06/17 1514    Clinical Impression Statement  Improved tenderness around his sternu. He reports the pain is almost gone. He had no increased pain with treatment. he had full pain free shoulder ROM. Therapy put an ionto patch on his anterior shoulder..     Clinical Presentation  Stable    Clinical Decision Making  Low    Rehab Potential  Excellent    PT Frequency  2x / week    PT Duration  4 weeks    PT Treatment/Interventions  Electrical Stimulation;Iontophoresis 4mg /ml Dexamethasone;Moist Heat;Therapeutic exercise;Ultrasound;Neuromuscular re-education;Patient/family education;Manual techniques;Taping;Dry needling;Passive range of motion    PT Next Visit Plan  Instruct in self massage to Lt pec and posterior shoulder girdle if patient is sore. He respoded well last visit to soft tissue mobilization. Continue PROM of the shoulder if there are motion restrictions; continue strengthening.     PT Home Exercise Plan  given red band for home     Consulted and Agree with Plan of Care  Patient       Patient will benefit from skilled therapeutic intervention in order to improve the following deficits and impairments:  Decreased activity tolerance, Pain, Impaired UE functional use  Visit Diagnosis: Acute  pain of left shoulder     Problem List Patient Active Problem List   Diagnosis Date Noted  . Rotator cuff disorder, left 03/09/2017  . Acute pain of  left shoulder 02/10/2017  . Acute left-sided low back pain without sciatica 11/23/2016  . Medicare welcome exam 09/03/2016  . Type 2 diabetes mellitus ([place]) 08/28/2015  . Gout 08/28/2015  . Morbid obesity ([place]) 08/28/2015  . Seasonal allergies 08/28/2015    Carney Living 04/06/2017, 4:46 PM  [facility] [address] [place], [name], [address] Phone: [phone]   Fax:  [phone]  Name: Roger Alvarado MRN: [id number] Date of Birth: 22-Aug-1950

## 2017-04-07 ENCOUNTER — Encounter: Payer: Self-pay | Admitting: Physical Therapy

## 2017-04-07 ENCOUNTER — Ambulatory Visit: Payer: PPO | Admitting: Physical Therapy

## 2017-04-07 DIAGNOSIS — M25512 Pain in left shoulder: Secondary | ICD-10-CM

## 2017-04-07 NOTE — Therapy (Signed)
[place], [name], [address] Phone: [phone]   Fax:  [phone]  Physical Therapy Treatment  Patient Details  Name: Roger Alvarado MRN: [id number] Date of Birth: 1951-02-25 Referring Provider: Hulan Saas, DO   Encounter Date: 04/07/2017  PT End of Session - 04/07/17 1816    Visit Number  5    Number of Visits  8    Date for PT Re-Evaluation  04/21/17    PT Start Time  7591    PT Stop Time  1512    PT Time Calculation (min)  50 min    Activity Tolerance  Patient tolerated treatment well    Behavior During Therapy  [facility] for tasks assessed/performed       Past Medical History:  Diagnosis Date  . Allergy   . Arthritis   . Asthma    Childhood  . Diabetes mellitus   . Diverticulitis   . Diverticulosis   . Gout   . Gout   . Hyperlipidemia   . Obesity, Class III, BMI 40-49.9 (morbid obesity) ([place])     History reviewed. No pertinent surgical history.  There were no vitals filed for this visit.  Subjective Assessment - 04/07/17 1425    Subjective  sORENESS VS PAIN.  aBLE TO DON/DOFF COAT WITHOUT INCREASED PAIN.     Currently in Pain?  Yes    Pain Score  2     Pain Location  Shoulder Just below collarbone,  left    Pain Orientation  Left    Pain Descriptors / Indicators  Sore    Pain Type  Acute pain    Pain Frequency  Constant    Aggravating Factors   recahing overhead,  across body    Pain Relieving Factors  rest,  patch   warm shower    Effect of Pain on Daily Activities  pain with reaching    Multiple Pain Sites  No         [facility] PT Assessment - 04/07/17 0001      PROM   Overall PROM Comments  WNL/  2/10 soreness flexion/  horizontal abd.     Right/Left Shoulder  Left                  [facility] Adult PT Treatment/Exercise - 04/07/17 0001      Moist Heat Therapy   Number Minutes Moist Heat  10 Minutes    Moist Heat Location  Shoulder      Manual Therapy   Manual  Therapy  Soft tissue mobilization strumming to lengthen tissues.    Manual therapy comments  taught how to use tennis balls for soft tissue work.  to pecs and upper traps upper back.     Soft tissue mobilization  shouldr girdle,  pecs,  ,  tissue softened.  pecks gently stretched.  tissue softened               PT Education - 04/06/17 1508    Education provided  Yes    Education Details  Reviewed HEP and technique     Person(s) Educated  Patient    Methods  Explanation;Demonstration;Tactile cues;Verbal cues    Comprehension  Verbalized understanding;Returned demonstration;Verbal cues required;Tactile cues required          PT Long Term Goals - 04/07/17 1819      PT LONG TERM GOAL #1   Title  Pt will improve L shoulder AROM to WNL and  painfree to return to baseline of no pain    Baseline  2.10 pain,  ROM  WNL.    Time  4    Period  Weeks    Status  On-going      PT LONG TERM GOAL #2   Title  Pt will return to donning his coat without shoulder pain    Baseline  able to do     Time  4    Period  Weeks    Status  Achieved      PT LONG TERM GOAL #3   Title  Pt will lift a gallon of milk with the R UE without increased pain    Time  4    Period  Weeks    Status  Unable to assess      PT LONG TERM GOAL #4   Title  Pt will improve FOTO to 72% functional score or greater    Time  4    Period  Weeks    Status  Unable to assess            Plan - 04/07/17 1816    Clinical Impression Statement  Patient has AROM in shoulder WNL with 2/10 pain.  Horizontal add and flexion painful.  I forgot to give ionto today ,  I was highly focused on manual to decrease pain and soften tissues.  Will do next visit.  Pain decreased with today's session.   LTG#2 met,      PT Next Visit Plan  Answer any  self massage techniques to Lt pec and posterior shoulder girdle .  Continue Ionto if patient is sore.  He respoded well last visit to soft tissue mobilization. Continue PROM of the  shoulder if there are motion restrictions; continue strengthening.     PT Home Exercise Plan  given red band for home     Consulted and Agree with Plan of Care  Patient       Patient will benefit from skilled therapeutic intervention in order to improve the following deficits and impairments:     Visit Diagnosis: Acute pain of left shoulder     Problem List Patient Active Problem List   Diagnosis Date Noted  . Rotator cuff disorder, left 03/09/2017  . Acute pain of left shoulder 02/10/2017  . Acute left-sided low back pain without sciatica 11/23/2016  . Medicare welcome exam 09/03/2016  . Type 2 diabetes mellitus ([place]) 08/28/2015  . Gout 08/28/2015  . Morbid obesity ([place]) 08/28/2015  . Seasonal allergies 08/28/2015    Eriyanna Kofoed  PTA 04/07/2017, 6:22 PM  [facility] [address] [place], [name], [address] Phone: [phone]   Fax:  [phone]  Name: Roger Alvarado MRN: [id number] Date of Birth: 1950-05-17
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
## 2017-04-08 ENCOUNTER — Encounter: Payer: Self-pay | Admitting: Family Medicine

## 2017-04-08 ENCOUNTER — Ambulatory Visit: Payer: Self-pay

## 2017-04-08 ENCOUNTER — Ambulatory Visit (INDEPENDENT_AMBULATORY_CARE_PROVIDER_SITE_OTHER): Payer: PPO | Admitting: Family Medicine

## 2017-04-08 VITALS — BP 124/84 | HR 76 | Ht 69.0 in | Wt 321.0 lb

## 2017-04-08 DIAGNOSIS — M67912 Unspecified disorder of synovium and tendon, left shoulder: Secondary | ICD-10-CM

## 2017-04-08 DIAGNOSIS — M25519 Pain in unspecified shoulder: Secondary | ICD-10-CM | POA: Diagnosis not present

## 2017-04-08 NOTE — Patient Instructions (Signed)
Great to see you  Alvera Singh is your friend.  Continue the exercises 2 times a week for another 6 weeks.  I think you will do great but see me again in 6 weeks if not perfect  Happy New Year!

## 2017-04-08 NOTE — Assessment & Plan Note (Signed)
Patient does have a rotator cuff tear Is healing No change in management RTC in 6 weeks

## 2017-04-12 ENCOUNTER — Ambulatory Visit: Payer: PPO | Admitting: Physical Therapy

## 2017-04-12 ENCOUNTER — Encounter: Payer: Self-pay | Admitting: Physical Therapy

## 2017-04-12 DIAGNOSIS — M25512 Pain in left shoulder: Secondary | ICD-10-CM

## 2017-04-12 NOTE — Therapy (Signed)
[place], [name], [address] Phone: [phone]   Fax:  [phone]  Physical Therapy Treatment  Patient Details  Name: Roger Alvarado MRN: [id number] Date of Birth: March 11, 1951 Referring Provider: Hulan Saas, DO   Encounter Date: 04/12/2017  PT End of Session - 04/12/17 1507    Visit Number  6    Number of Visits  8    Date for PT Re-Evaluation  04/21/17    PT Start Time  9509 charge will not equal time slot due to patient wanted to be d/c then he changed his mind.    PT Stop Time  1500    PT Time Calculation (min)  44 min    Activity Tolerance  Patient tolerated treatment well    Behavior During Therapy  [facility] for tasks assessed/performed       Past Medical History:  Diagnosis Date  . Allergy   . Arthritis   . Asthma    Childhood  . Diabetes mellitus   . Diverticulitis   . Diverticulosis   . Gout   . Gout   . Hyperlipidemia   . Obesity, Class III, BMI 40-49.9 (morbid obesity) ([place])     History reviewed. No pertinent surgical history.  There were no vitals filed for this visit.  Subjective Assessment - 04/12/17 1418    Subjective  Was sore after manual last isit.  After that wore off pain decreased.  saw MD   Friday and he was pleased.  To return to MD in 6 weeks if needed,  able to sleep on shoulder ..  F not too long.    Pain Score  3     Pain Location  Shoulder    Pain Orientation  Left    Pain Descriptors / Indicators  Sore    Pain Type  Acute pain    Pain Frequency  Intermittent    Aggravating Factors   end range reaching.    Pain Relieving Factors  PT,  manual,  moving around to decreaseam soreness.     Effect of Pain on Daily Activities  Pain with reaching    Multiple Pain Sites  No         [facility] PT Assessment - 04/12/17 0001      Observation/Other Assessments   Focus on Therapeutic Outcomes (FOTO)   62% functional                  [facility] Adult PT  Treatment/Exercise - 04/12/17 0001      Iontophoresis   Type of Iontophoresis  Dexamethasone    Location  Lt [place] joint    Dose  1.0 cc    Time  80 mA, 6 hr patch      Manual Therapy   Manual Therapy  Soft tissue mobilization    Manual therapy comments  Passive ROM all ranges.  Overall tissue less tight than last visit Pecs tender tight.  tissue more mobile.  SCM tight,      Soft tissue mobilization  upper trap  levaoor softened..                   PT Long Term Goals - 04/12/17 1422      PT LONG TERM GOAL #1   Title  Pt will improve L shoulder AROM to WNL and painfree to return to baseline of no pain    Baseline  WNL  203/10 pain    Status  Partially  Met      PT LONG TERM GOAL #2   Title  Pt will return to donning his coat without shoulder pain    Baseline  able to do     Time  4    Period  Weeks    Status  Achieved      PT LONG TERM GOAL #3   Title  Pt will lift a gallon of milk with the  left UE without increased pain    Baseline  able    Time  4    Status  Achieved      PT LONG TERM GOAL #4   Title  Pt will improve FOTO to 72% functional score or greater    Baseline  FOTO score improved 1 point,  62% functional    Time  4    Period  Weeks    Status  On-going            Plan - 04/12/17 1608    Clinical Impression Statement  Patient was thinking and had planned on today being his last day,  Until soft tissue work with several (inproving) sore areas found.  He has decided to attend 1 more session.  LTG#3 met. FOTO score has improved 1 point.  He feels he has demonstrated more than 1 point improvement.  Dillard Cannon continued.  No new exercises added today due to time constraints.     PT Next Visit Plan  patient wants 1 more appointment.  he is probably ready for strengthening exercises,    PT Home Exercise Plan  given red band for home     Consulted and Agree with Plan of Care  Patient       Patient will benefit from skilled therapeutic intervention in  order to improve the following deficits and impairments:     Visit Diagnosis: Acute pain of left shoulder     Problem List Patient Active Problem List   Diagnosis Date Noted  . Rotator cuff disorder, left 03/09/2017  . Acute pain of left shoulder 02/10/2017  . Acute left-sided low back pain without sciatica 11/23/2016  . Medicare welcome exam 09/03/2016  . Type 2 diabetes mellitus ([place]) 08/28/2015  . Gout 08/28/2015  . Morbid obesity ([place]) 08/28/2015  . Seasonal allergies 08/28/2015    Brenyn Petrey PTA 04/12/2017, 4:17 PM  [facility] [address] [place], [name], [address] Phone: [phone]   Fax:  [phone]  Name: Roger Alvarado MRN: [id number] Date of Birth: [birth year]

## 2017-04-14 ENCOUNTER — Encounter: Payer: Self-pay | Admitting: Physical Therapy

## 2017-04-14 ENCOUNTER — Ambulatory Visit: Payer: PPO | Admitting: Physical Therapy

## 2017-04-14 DIAGNOSIS — M25512 Pain in left shoulder: Secondary | ICD-10-CM

## 2017-04-14 NOTE — Patient Instructions (Signed)
Strengthening: Resisted Internal Rotation +  .  http://orth.exer.us/830   Copyright  VHI. All rights reserved.  Strengthening: Resisted External Rotation   Hold tubing in right hand, elbow at side and forearm across body. Rotate forearm out. Repeat ___10_ times per set. Do __1-3__ sets per session. Do __1__ sessions per day.   Use a small rolled towel under arm.

## 2017-04-14 NOTE — Therapy (Addendum)
[place], [name], [address] Phone: [phone]   Fax:  [phone]  Physical Therapy Treatment/ Discharge   Patient Details  Name: Roger Alvarado MRN: [id number] Date of Birth: 10/06/1950 Referring Provider: Hulan Saas, DO   Encounter Date: 04/14/2017  PT End of Session - 04/14/17 1537    Visit Number  7    Number of Visits  8    Date for PT Re-Evaluation  04/21/17    PT Start Time  1331    PT Stop Time  1412    PT Time Calculation (min)  41 min    Activity Tolerance  Patient tolerated treatment well    Behavior During Therapy  [facility] for tasks assessed/performed       Past Medical History:  Diagnosis Date  . Allergy   . Arthritis   . Asthma    Childhood  . Diabetes mellitus   . Diverticulitis   . Diverticulosis   . Gout   . Gout   . Hyperlipidemia   . Obesity, Class III, BMI 40-49.9 (morbid obesity) ([place])     History reviewed. No pertinent surgical history.  There were no vitals filed for this visit.  Subjective Assessment - 04/14/17 1526    Subjective  Today is the last day.  I feel pretty good.  I was sore after the last session then it got better.  No pain at the moment.     Currently in Pain?  No/denies    Pain Score  -- mild if end range reaching    Pain Location  Shoulder    Pain Orientation  Left    Pain Descriptors / Indicators  Sore    Pain Type  Acute pain    Aggravating Factors   reaching overhead    Pain Relieving Factors  manual PT    Effect of Pain on Daily Activities  I have not yet started back to the free weights.  10 LBS barbell.    Multiple Pain Sites  No         [facility] PT Assessment - 04/14/17 0001      PROM   Overall PROM Comments  WNL  mild soreness flexion      Strength   Overall Strength Comments  5/5   no pain                  [facility] Adult PT Treatment/Exercise - 04/14/17 0001      Shoulder Exercises: Standing   External Rotation  10  reps green band with roll towel,  HEP    Theraband Level (Shoulder Internal Rotation)  -- 3 X green band too easy    Extension Limitations  3 X green band , too easy    Other Standing Exercises  4 Lbs shoulder flexion 10 X,  Biceps 10 LBS 10 X     Other Standing Exercises  leaning on counter shoulder extension 10 LBS x 10,  Tricep  10 LBS 10 X no pain.       Manual Therapy   Manual Therapy  Soft tissue mobilization;Passive ROM    Soft tissue mobilization  upper trap , levator,  deltoid, pectoralis,  anteriorlateral neck ,  teres.  tender areas softened.  taught patient how to do withsliding arm up wall for easy reach to shoulder for soft tissue.,  strumming to tissue with ROM stretch to lengthen.      Passive ROM  shoulder extension ER/IR Flexion,  Abduction with strumming             PT Education - 04/14/17 1413    Education provided  Yes    Education Details  HEP,  hoe to do manual in standing    Person(s) Educated  Patient    Methods  Explanation;Demonstration;Tactile cues;Handout    Comprehension  Verbalized understanding;Returned demonstration          PT Long Term Goals - 04/14/17 1542      PT LONG TERM GOAL #1   Title  Pt will improve L shoulder AROM to WNL and painfree to return to baseline of no pain    Baseline  WNL  2 to 3/10 pain    Time  4    Period  Weeks    Status  Partially Met      PT LONG TERM GOAL #2   Title  Pt will return to donning his coat without shoulder pain    Baseline  able to do     Time  4    Period  Weeks    Status  Achieved      PT LONG TERM GOAL #3   Title  Pt will lift a gallon of milk with the  left UE without increased pain    Baseline  able    Time  4    Period  Weeks    Status  Achieved      PT LONG TERM GOAL #4   Title  Pt will improve FOTO to 72% functional score or greater    Baseline  FOTO score improved 1 point,  62% functional    Time  4    Period  Weeks    Status  Not Met            Plan - 04/14/17 1538     Clinical Impression Statement  Discharge to HEP at patient's request.  He happy with his results.  FOTO last visit improved 1 point however he feels her has improved functionally much more than that.  LTG#1 partially met with mild pand end range flexion,  LTG#2 met, LTG#3 met,  LTG# 4 not met.     PT Next Visit Plan  D/c today at patient's request    PT Home Exercise Plan  Shoulder ER band,  he will also return to using his free weights      Consulted and Agree with Plan of Care  Patient       Patient will benefit from skilled therapeutic intervention in order to improve the following deficits and impairments:     Visit Diagnosis: Acute pain of left shoulder  PHYSICAL THERAPY DISCHARGE SUMMARY  Visits from Start of Care: 7  Current functional level related to goals / functional outcomes: Improved pain    Remaining deficits: Pain at times    Education / Equipment: HEP  Plan: Patient agrees to discharge.  Patient goals were not met. Patient is being discharged due to meeting the stated rehab goals.  ?????       Problem List Patient Active Problem List   Diagnosis Date Noted  . Rotator cuff disorder, left 03/09/2017  . Acute pain of left shoulder 02/10/2017  . Acute left-sided low back pain without sciatica 11/23/2016  . Medicare welcome exam 09/03/2016  . Type 2 diabetes mellitus ([place]) 08/28/2015  . Gout 08/28/2015  . Morbid obesity ([place]) 08/28/2015  . Seasonal allergies 08/28/2015   Carolyne Littles PT DPT  05/03/2017 1:07 PM  Michayla Mcneil PTA 04/14/2017, 3:44 PM  [facility] [address] [place], [name], [address] Phone: [phone]   Fax:  [phone]  Name: Roger Alvarado MRN: [id number] Date of Birth: June 05, 1950

## 2017-04-16 ENCOUNTER — Other Ambulatory Visit: Payer: Self-pay | Admitting: Family

## 2017-04-19 ENCOUNTER — Encounter: Payer: PPO | Admitting: Physical Therapy

## 2017-04-20 ENCOUNTER — Ambulatory Visit: Payer: PPO | Admitting: Nurse Practitioner

## 2017-04-21 ENCOUNTER — Encounter: Payer: PPO | Admitting: Physical Therapy

## 2017-06-25 ENCOUNTER — Other Ambulatory Visit: Payer: Self-pay | Admitting: Family Medicine

## 2017-07-05 NOTE — Progress Notes (Signed)
[facility] [place], [place] [address] Phone: [phone] Subjective:     CC: Left shoulder pain  [id number]  Roger Alvarado is a 67 y.o. male coming in with complaint of left shoulder pain. He continues to have pain on the left side of his neck. HE does stretch but that does not help the tightness. He also is having point tenderness in the left pec.  Found to have more of a rotator cuff bursitis previously as well.  Feels like it is worsening.  Waking him up at night.  He also is having a heel issue. Right heel, pain in the morning. Pain increases with weight bearing for prolonged periods of time. He is icing which seems to help. Patient has had pain for 1 month. He is using an insole that is helping.      Past Medical History:  Diagnosis Date  . Allergy   . Arthritis   . Asthma    Childhood  . Diabetes mellitus   . Diverticulitis   . Diverticulosis   . Gout   . Gout   . Hyperlipidemia   . Obesity, Class III, BMI 40-49.9 (morbid obesity) ([place])    No past surgical history on file. Social History   Socioeconomic History  . Marital status: Married    Spouse name: Not on file  . Number of children: 2  . Years of education: 68  . Highest education level: Not on file  Occupational History  . Occupation: Retired  Scientific laboratory technician  . Financial resource strain: Not on file  . Food insecurity:    Worry: Not on file    Inability: Not on file  . Transportation needs:    Medical: Not on file    Non-medical: Not on file  Tobacco Use  . Smoking status: Never Smoker  . Smokeless tobacco: Never Used  Substance and Sexual Activity  . Alcohol use: Yes    Alcohol/week: 1.2 oz    Types: 2 Cans of beer per week  . Drug use: No  . Sexual activity: Not on file  Lifestyle  . Physical activity:    Days per week: Not on file    Minutes per session: Not on file  . Stress: Not on file  Relationships  . Social connections:    Talks on  phone: Not on file    Gets together: Not on file    Attends religious service: Not on file    Active member of club or organization: Not on file    Attends meetings of clubs or organizations: Not on file    Relationship status: Not on file  Other Topics Concern  . Not on file  Social History Narrative   Fun: Watch sports, gardening    No Known Allergies Family History  Problem Relation Age of Onset  . Diabetes Brother   . Hypertension Brother   . Diabetes Brother   . Hypertension Brother   . Diabetes Father   . Hypertension Father   . Diabetes Sister   . Asthma Mother   . Heart attack Maternal Grandfather      Past medical history, social, surgical and family history all reviewed in electronic medical record.  No pertanent information unless stated regarding to the chief complaint.   Review of Systems:Review of systems updated and as accurate as of 07/07/66  No headache, visual changes, nausea, vomiting, diarrhea, constipation, dizziness, abdominal pain, skin rash, fevers, chills, night sweats, weight  loss, swollen lymph nodes, body aches, joint swelling, muscle aches, chest pain, shortness of breath, mood changes.   Objective  Blood pressure 112/82, pulse 79, height 5\' 9"  (1.753 m), weight (!) 322 lb (146.1 kg), SpO2 95 %. Systems examined below as of 07/06/17   General: No apparent distress alert and oriented x3 mood and affect normal, dressed appropriately.  HEENT: Pupils equal, extraocular movements intact  Respiratory: Patient's speak in full sentences and does not appear short of breath  Cardiovascular: No lower extremity edema, non tender, no erythema  Skin: Warm dry intact with no signs of infection or rash on extremities or on axial skeleton.  Abdomen: Soft nontender  Neuro: Cranial nerves II through XII are intact, neurovascularly intact in all extremities with 2+ DTRs and 2+ pulses.  Lymph: No lymphadenopathy of posterior or anterior cervical chain or axillae  bilaterally.  Gait antalgic gait MSK:  Non tender with full range of motion and good stability and symmetric strength and tone of  elbows, wrist, hip, knee and ankles bilaterally.  Foot exam on the right side shows severe overpronation of the hindfoot.  Patient does have severe breakdown of the longitudinal arch.  Tender to palpation over the medial calcaneal area.  Mild limitation in all planes of the ankle by 2 degrees.  Neurovascularly intact distally with 5 out of 5 strength Shoulder: left  Inspection reveals no abnormalities, atrophy or asymmetry. Palpation is normal with no tenderness over AC joint or bicipital groove. ROM is full in all planes passively. Rotator cuff strength normal throughout. signs of impingement with positive Neer and Hawkin's tests, but negative empty can sign. Speeds and Yergason's tests normal. No labral pathology noted with negative Obrien's, negative clunk and good stability. Normal scapular function observed. No painful arc and no drop arm sign. No apprehension sign Contralateral shoulder unremarkable  MSK US performed of: left This study was ordered, performed, and interpreted by Charlann Boxer D.O.  Shoulder:   Supraspinatus:  Appears normal on long and transverse views, Bursal bulge seen with shoulder abduction on impingement view. Infraspinatus:  Appears normal on long and transverse views. Significant increase in Doppler flow Subscapularis:  Appears normal on long and transverse views. Positive bursa Teres Minor:  Appears normal on long and transverse views. AC joint:  Capsule undistended, no geyser sign. Glenohumeral Joint:  Appears normal without effusion. Glenoid Labrum:  Intact without visualized tears. Biceps Tendon:  Appears normal on long and transverse views, no fraying of tendon, tendon located in intertubercular groove, no subluxation with shoulder internal or external rotation.  Impression: Subacromial bursitis but otherwise improvement from  previous findings  Procedure: Real-time Ultrasound Guided Injection of left glenohumeral joint Device: GE Logiq E  Ultrasound guided injection is preferred based studies that show increased duration, increased effect, greater accuracy, decreased procedural pain, increased response rate with ultrasound guided versus blind injection.  Verbal informed consent obtained.  Time-out conducted.  Noted no overlying erythema, induration, or other signs of local infection.  Skin prepped in a sterile fashion.  Local anesthesia: Topical Ethyl chloride.  With sterile technique and under real time ultrasound guidance:  Joint visualized.  23g 1  inch needle inserted posterior approach. Pictures taken for needle placement. Patient did have injection of 2 cc of 1% lidocaine, 2 cc of 0.5% Marcaine, and 1.0 cc of Kenalog 40 mg/dL. Completed without difficulty  Pain immediately resolved suggesting accurate placement of the medication.  Advised to call if fevers/chills, erythema, induration, drainage, or persistent bleeding.  Images permanently stored and available for review in the ultrasound unit.  Impression: Technically successful ultrasound guided injection.   Impression and Recommendations:     This case required medical decision making of moderate complexity.      Note: This dictation was prepared with Dragon dictation along with smaller phrase technology. Any transcriptional errors that result from this process are unintentional.

## 2017-07-06 ENCOUNTER — Ambulatory Visit: Payer: PPO | Admitting: Family Medicine

## 2017-07-06 ENCOUNTER — Ambulatory Visit: Payer: Self-pay

## 2017-07-06 ENCOUNTER — Encounter: Payer: Self-pay | Admitting: Family Medicine

## 2017-07-06 VITALS — BP 112/82 | HR 79 | Ht 69.0 in | Wt 322.0 lb

## 2017-07-06 DIAGNOSIS — M722 Plantar fascial fibromatosis: Secondary | ICD-10-CM

## 2017-07-06 DIAGNOSIS — M67912 Unspecified disorder of synovium and tendon, left shoulder: Secondary | ICD-10-CM | POA: Diagnosis not present

## 2017-07-06 DIAGNOSIS — M79671 Pain in right foot: Secondary | ICD-10-CM | POA: Diagnosis not present

## 2017-07-06 NOTE — Assessment & Plan Note (Signed)
Plantar Fascitis: We reviewed that stretching is critically important to the treatment of PF. Reviewed footwear. Rigid soles have been shown to help with PF. Night splints can help. Reviewed rehab of stretching and calf raises.  Could benefit from a corticosteroid injection, orthotics, or other measures if conservative treatment fails. Discussed over-the-counter orthotics and weight loss.  Follow-up again 4 weeks

## 2017-07-06 NOTE — Patient Instructions (Addendum)
Good to see you  Ice is your friend  Injected the shoulder Good shoes with rigid bottom.  Roger Alvarado, [place] or New balance greater then 700 Spenco orthotics "total support" online would be great  Avoid being barefoot Exercises 3 times a week.  See me again in 4 weeks

## 2017-07-06 NOTE — Assessment & Plan Note (Signed)
Given injection.  Tolerated the procedure well.  Discussed icing regimen and home exercises.  Discussed which activities to doing which wants to avoid follow-up again in 4 weeks

## 2017-08-14 NOTE — Progress Notes (Signed)
[facility] [place], [place] [address] Phone: [phone] Subjective:    I'm seeing this patient by the request  of:    CC: Left shoulder pain follow-up  NTI:[id number]  Roger Alvarado is a 67 y.o. male coming in with complaint of left shoulder pain. He said that if he does chores or lifting his shoulder becomes sore. He has been doing his exercises and stretches. He also notes that he sleeps on his left side which exerbates his pain. He did feel that the injection from last visit did help.  Given injection in the left shoulder previously.  Patient states that it seems to be more in the neck.  Was secondary to lifting.  Goes down the scapula.  Somewhat different than previous exam.      Past Medical History:  Diagnosis Date  . Allergy   . Arthritis   . Asthma    Childhood  . Diabetes mellitus   . Diverticulitis   . Diverticulosis   . Gout   . Gout   . Hyperlipidemia   . Obesity, Class III, BMI 40-49.9 (morbid obesity) ([place])    No past surgical history on file. Social History   Socioeconomic History  . Marital status: Married    Spouse name: Not on file  . Number of children: 2  . Years of education: 50  . Highest education level: Not on file  Occupational History  . Occupation: Retired  Scientific laboratory technician  . Financial resource strain: Not on file  . Food insecurity:    Worry: Not on file    Inability: Not on file  . Transportation needs:    Medical: Not on file    Non-medical: Not on file  Tobacco Use  . Smoking status: Never Smoker  . Smokeless tobacco: Never Used  Substance and Sexual Activity  . Alcohol use: Yes    Alcohol/week: 1.2 oz    Types: 2 Cans of beer per week  . Drug use: No  . Sexual activity: Not on file  Lifestyle  . Physical activity:    Days per week: Not on file    Minutes per session: Not on file  . Stress: Not on file  Relationships  . Social connections:    Talks on phone: Not on file   Gets together: Not on file    Attends religious service: Not on file    Active member of club or organization: Not on file    Attends meetings of clubs or organizations: Not on file    Relationship status: Not on file  Other Topics Concern  . Not on file  Social History Narrative   Fun: Watch sports, gardening    No Known Allergies Family History  Problem Relation Age of Onset  . Diabetes Brother   . Hypertension Brother   . Diabetes Brother   . Hypertension Brother   . Diabetes Father   . Hypertension Father   . Diabetes Sister   . Asthma Mother   . Heart attack Maternal Grandfather      Past medical history, social, surgical and family history all reviewed in electronic medical record.  No pertanent information unless stated regarding to the chief complaint.   Review of Systems:Review of systems updated and as accurate as of 08/15/17  No headache, visual changes, nausea, vomiting, diarrhea, constipation, dizziness, abdominal pain, skin rash, fevers, chills, night sweats, weight loss, swollen lymph nodes, body aches, joint swelling, chest pain, shortness  of breath, mood changes.  Positive muscle aches  Objective  Blood pressure 122/86, pulse 96, height 5\' 9"  (1.753 m), weight (!) 322 lb (146.1 kg), SpO2 97 %. Systems examined below as of 08/15/17   General: No apparent distress alert and oriented x3 mood and affect normal, dressed appropriately.  HEENT: Pupils equal, extraocular movements intact  Respiratory: Patient's speak in full sentences and does not appear short of breath  Cardiovascular: No lower extremity edema, non tender, no erythema  Skin: Warm dry intact with no signs of infection or rash on extremities or on axial skeleton.  Abdomen: Soft nontender  Neuro: Cranial nerves II through XII are intact, neurovascularly intact in all extremities with 2+ DTRs and 2+ pulses.  Lymph: No lymphadenopathy of posterior or anterior cervical chain or axillae bilaterally.  Gait  normal with good balance and coordination.  MSK:  Non tender with full range of motion and good stability and symmetric strength and tone of shoulders, elbows, wrist, hip, knee and ankles bilaterally.   Left shoulder exam shows the patient does have positive impingement still remaining.  4+ out of 5 strength compared to contralateral side.  Mild positive O'Brien's.  Severe tenderness though over the paraspinal and parascapular muscles in the thoracic spine.  Trigger points noted in multiple locations of the left shoulder.  After verbal consent patient was prepped with alcohol swabs and with a 25-gauge half inch needle injected and 5 distinct trigger points in the left shoulder region.  Total of 3 cc of 0.5% Marcaine and 1 cc of Kenalog 40 mg/mL used.  No blood loss.  Postinjection instructions given    Impression and Recommendations:     This case required medical decision making of moderate complexity.      Note: This dictation was prepared with Dragon dictation along with smaller phrase technology. Any transcriptional errors that result from this process are unintentional.

## 2017-08-15 ENCOUNTER — Ambulatory Visit (INDEPENDENT_AMBULATORY_CARE_PROVIDER_SITE_OTHER): Payer: PPO | Admitting: Family Medicine

## 2017-08-15 ENCOUNTER — Encounter: Payer: Self-pay | Admitting: Family Medicine

## 2017-08-15 DIAGNOSIS — M25512 Pain in left shoulder: Secondary | ICD-10-CM

## 2017-08-15 NOTE — Assessment & Plan Note (Signed)
Injected today.  Does have rotator cuff disorder on the left side.  Hopefully this will be beneficial and I think it is more of a muscle injury.  Patient will continue home exercises in 72 hours.  Discussed icing regimen.  Worsening symptoms may need advanced imaging.

## 2017-08-15 NOTE — Patient Instructions (Signed)
Good to see you  Roger Alvarado is your friend.  Stay active Avoid heavy lifting Try to keep hands within peripheral vision  Gave you trigger point injections today  See me again in 6 weeks

## 2017-08-16 ENCOUNTER — Telehealth: Payer: Self-pay | Admitting: Emergency Medicine

## 2017-08-16 NOTE — Telephone Encounter (Signed)
Called patient to schedule AWV. Patient declined at this time. 

## 2017-08-22 ENCOUNTER — Encounter: Payer: Self-pay | Admitting: Nurse Practitioner

## 2017-08-22 ENCOUNTER — Other Ambulatory Visit (INDEPENDENT_AMBULATORY_CARE_PROVIDER_SITE_OTHER): Payer: PPO

## 2017-08-22 ENCOUNTER — Ambulatory Visit (INDEPENDENT_AMBULATORY_CARE_PROVIDER_SITE_OTHER): Payer: PPO | Admitting: Nurse Practitioner

## 2017-08-22 VITALS — BP 128/78 | HR 81 | Temp 98.3°F | Resp 18 | Ht 69.0 in | Wt 318.0 lb

## 2017-08-22 DIAGNOSIS — E785 Hyperlipidemia, unspecified: Secondary | ICD-10-CM | POA: Insufficient documentation

## 2017-08-22 DIAGNOSIS — M109 Gout, unspecified: Secondary | ICD-10-CM

## 2017-08-22 DIAGNOSIS — Z125 Encounter for screening for malignant neoplasm of prostate: Secondary | ICD-10-CM | POA: Diagnosis not present

## 2017-08-22 DIAGNOSIS — L720 Epidermal cyst: Secondary | ICD-10-CM

## 2017-08-22 DIAGNOSIS — E118 Type 2 diabetes mellitus with unspecified complications: Secondary | ICD-10-CM

## 2017-08-22 DIAGNOSIS — Z0001 Encounter for general adult medical examination with abnormal findings: Secondary | ICD-10-CM | POA: Diagnosis not present

## 2017-08-22 LAB — LIPID PANEL
CHOL/HDL RATIO: 4
Cholesterol: 184 mg/dL (ref 0–200)
HDL: 50 mg/dL (ref >39.00)
LDL CALC: 115 mg/dL — AB (ref 0–99)
NONHDL: 133.72
TRIGLYCERIDES: 96 mg/dL (ref 0.0–149.0)
VLDL: 19.2 mg/dL (ref 0.0–40.0)

## 2017-08-22 LAB — CBC
HCT: 46.2 % (ref 39.0–52.0)
Hemoglobin: 15.4 g/dL (ref 13.0–17.0)
MCHC: 33.4 g/dL (ref 30.0–36.0)
MCV: 92.4 fl (ref 78.0–100.0)
Platelets: 229 10*3/uL (ref 150.0–400.0)
RBC: 5 Mil/uL (ref 4.22–5.81)
RDW: 14.3 % (ref 11.5–15.5)
WBC: 9.7 10*3/uL (ref 4.0–10.5)

## 2017-08-22 LAB — COMPREHENSIVE METABOLIC PANEL
ALBUMIN: 4.3 g/dL (ref 3.5–5.2)
ALK PHOS: 59 U/L (ref 39–117)
ALT: 21 U/L (ref 0–53)
AST: 18 U/L (ref 0–37)
BUN: 20 mg/dL (ref 6–23)
CO2: 24 mEq/L (ref 19–32)
CREATININE: 1.38 mg/dL (ref 0.40–1.50)
Calcium: 9.5 mg/dL (ref 8.4–10.5)
Chloride: 104 mEq/L (ref 96–112)
GFR: 65.93 mL/min (ref >60.00)
Glucose, Bld: 143 mg/dL — ABNORMAL HIGH (ref 70–99)
POTASSIUM: 4.8 meq/L (ref 3.5–5.1)
SODIUM: 137 meq/L (ref 135–145)
TOTAL PROTEIN: 7.4 g/dL (ref 6.0–8.3)
Total Bilirubin: 0.9 mg/dL (ref 0.2–1.2)

## 2017-08-22 LAB — TSH: TSH: 1.34 u[IU]/mL (ref 0.35–4.50)

## 2017-08-22 LAB — HEMOGLOBIN A1C: Hgb A1c MFr Bld: 7.3 % — ABNORMAL HIGH (ref 4.6–6.5)

## 2017-08-22 LAB — URIC ACID: Uric Acid, Serum: 9.2 mg/dL — ABNORMAL HIGH (ref 4.0–7.8)

## 2017-08-22 LAB — PSA: PSA: 0.34 ng/mL (ref 0.10–4.00)

## 2017-08-22 MED ORDER — COLCHICINE 0.6 MG PO TABS: ORAL_TABLET | ORAL | 2 refills | Status: DC

## 2017-08-22 NOTE — Assessment & Plan Note (Signed)
Stable Continue current medications Update labs - Comprehensive metabolic panel; Future - Hemoglobin A1c; Future - HM DIABETES FOOT EXAM; Future-We discussed routine foot inspection at home, wearing supportive shoes, and avoiding going barefoot. - TSH; Future

## 2017-08-22 NOTE — Assessment & Plan Note (Signed)
Has been maintained on colchicine PRN Reports foot pain several weeks ago that he thought might be gout but once he started wearing different shoes the pain resolved He reports taking the colchicine rarely for acute gout pain, maybe 3-4 doses every few months Will update uric acid and F/U with further recommendations - colchicine 0.6 MG tablet; Take 2 tablets by mouth at onset of flare and then 1 tablet 1 hour later.  Dispense: 30 tablet; Refill: 2 - Uric acid; Future

## 2017-08-22 NOTE — Assessment & Plan Note (Addendum)
-  Prostate cancer screening and PSA options (with potential risks and benefits of testing vs not testing) were discussed along with recent recs/guidelines. -USPSTF grade A and B recommendations reviewed with patient; age-appropriate recommendations, preventive care, screening tests, etc discussed and encouraged; healthy living encouraged; see AVS for patient education given to patient. Advanced directives packet given. -Discussed importance of 150 minutes of physical activity weekly, eat 6 servings of fruit/vegetables daily and drink plenty of water and avoid sweet beverages.  -Follow up and care instructions discussed and provided in AVS.  -Reviewed Health Maintenance: - Hemoglobin A1c; Future - HM DIABETES FOOT EXAM; Future -annual eye exam overdue- he says that he will call his ophthalmologist to schedule  Screening for prostate cancer- PSA; Future

## 2017-08-22 NOTE — Progress Notes (Signed)
Name: Roger Alvarado   MRN: [id number]    DOB: [birth year]   Date:08/22/2017       Progress Note  Subjective  Chief Complaint  Chief Complaint  Patient presents with  . CPE    fasting    HPI  Patient presents for annual CPE.  USPSTF grade A and B recommendations:  Diet: trying to eat more fiber, fruit, vegetables Exercise: about 2-3 times per week  Depression: no concerns for anxiety or depression Depression screen [facility] 2/9 09/03/2016  Decreased Interest 0  Down, Depressed, Hopeless 0  PHQ - 2 Score 0   Hypertension: BP Readings from Last 3 Encounters:  08/22/17 128/78  08/15/17 122/86  07/06/17 112/82   Obesity: Wt Readings from Last 3 Encounters:  08/22/17 (!) 318 lb (144.2 kg)  08/15/17 (!) 322 lb (146.1 kg)  07/06/17 (!) 322 lb (146.1 kg)   BMI Readings from Last 3 Encounters:  08/22/17 46.96 kg/m  08/15/17 47.55 kg/m  07/06/17 47.55 kg/m    Cholesterol- maintained on pravastatin 40 daily Also maintained on daily 81 mg asa for risk reduction Reports daily medication compliance without adverse medication effects including myalgias, nausea.  Lab Results  Component Value Date   CHOL 156 09/03/2016   HDL 43.70 09/03/2016   LDLCALC 85 09/03/2016   TRIG 132.0 09/03/2016   CHOLHDL 4 09/03/2016   Diabetes- maintained on  Metformin 100 BID and  Also maintained on lisinopril 5 daily for renal protection Reports daily medication compliance without adverse medication effects. Reports he does not routinely check his blood sugars at home. Denies tremor, diaphoresis, polyuria, polydipsia, polyphagia.  Lab Results  Component Value Date   HGBA1C 6.5 02/21/2017    Alcohol: about 2 beers per week Tobacco use: no, never  STD testing and prevention (chl/gon/syphilis): declines, no concerns HIV, hep C: hep C screening done, declines HIV screening  Skin cancer: no concerning lesions or moles Colorectal cancer: No personal or family history of colon ca, no  abdominal pain, no bowel changes, no rectal bleeding. Colonoscopy up to date  Prostate cancer: PSA test today IPSS Questionnaire (AUA-7): Over the past month.   1)  How often have you had a sensation of not emptying your bladder completely after you finish urinating?  3 - About half the time  2)  How often have you had to urinate again less than two hours after you finished urinating? 2 - Less than half the time  3)  How often have you found you stopped and started again several times when you urinated?  3 - About half the time  4) How difficult have you found it to postpone urination?  0 - Not at all  5) How often have you had a weak urinary stream?  4 - More than half the time  6) How often have you had to push or strain to begin urination?  3 - About half the time  7) How many times did you most typically get up to urinate from the time you went to bed until the time you got up in the morning?  1 - 1 time  Total score:  16   Aspirin: takes 81 mg asa daily ECG:  Not indicated  Vaccinations: up to date    Advanced Care Planning: A voluntary discussion about advance care planning including the explanation and discussion of advance directives.  Discussed health care proxy and Living will, and the patient DOES NOT have a living will at present time.  If patient does have living will, I have requested they bring this to the clinic to be scanned in to their chart.  Patient Active Problem List   Diagnosis Date Noted  . Trigger point of left shoulder region 08/15/2017  . Plantar fasciitis, right 07/06/2017  . Rotator cuff disorder, left 03/09/2017  . Medicare welcome exam 09/03/2016  . Type 2 diabetes mellitus ([place]) 08/28/2015  . Gout 08/28/2015  . Morbid obesity ([place]) 08/28/2015  . Seasonal allergies 08/28/2015    No past surgical history on file.  Family History  Problem Relation Age of Onset  . Diabetes Brother   . Hypertension Brother   . Diabetes Brother   . Hypertension Brother    . Diabetes Father   . Hypertension Father   . Diabetes Sister   . Asthma Mother   . Heart attack Maternal Grandfather     Social History   Socioeconomic History  . Marital status: Married    Spouse name: Not on file  . Number of children: 2  . Years of education: 75  . Highest education level: Not on file  Occupational History  . Occupation: Retired  Scientific laboratory technician  . Financial resource strain: Not on file  . Food insecurity:    Worry: Not on file    Inability: Not on file  . Transportation needs:    Medical: Not on file    Non-medical: Not on file  Tobacco Use  . Smoking status: Never Smoker  . Smokeless tobacco: Never Used  Substance and Sexual Activity  . Alcohol use: Yes    Alcohol/week: 1.2 oz    Types: 2 Cans of beer per week  . Drug use: No  . Sexual activity: Not on file  Lifestyle  . Physical activity:    Days per week: Not on file    Minutes per session: Not on file  . Stress: Not on file  Relationships  . Social connections:    Talks on phone: Not on file    Gets together: Not on file    Attends religious service: Not on file    Active member of club or organization: Not on file    Attends meetings of clubs or organizations: Not on file    Relationship status: Not on file  . Intimate partner violence:    Fear of current or ex partner: Not on file    Emotionally abused: Not on file    Physically abused: Not on file    Forced sexual activity: Not on file  Other Topics Concern  . Not on file  Social History Narrative   Fun: Watch sports, gardening      Current Outpatient Medications:  .  aspirin 81 MG tablet, Take 81 mg by mouth daily., Disp: , Rfl:  .  colchicine 0.6 MG tablet, Take 2 tablets by mouth at onset of flare and then 1 tablet 1 hour later., Disp: 30 tablet, Rfl: 2 .  cyclobenzaprine (FLEXERIL) 10 MG tablet, Take 0.5-1 tablets (5-10 mg total) by mouth 3 (three) times daily as needed for muscle spasms., Disp: 60 tablet, Rfl: 0 .   lisinopril (PRINIVIL,ZESTRIL) 5 MG tablet, Take 1 tablet (5 mg total) by mouth daily., Disp: 90 tablet, Rfl: 1 .  metFORMIN (GLUCOPHAGE) 1000 MG tablet, Take 1 tablet (1,000 mg total) by mouth 2 (two) times daily., Disp: 180 tablet, Rfl: 1 .  pravastatin (PRAVACHOL) 40 MG tablet, Take 1 tablet (40 mg total) by mouth daily., Disp: 90 tablet, Rfl:  1 .  Vitamin D, Ergocalciferol, (DRISDOL) 50000 units CAPS capsule, TAKE 1 CAPSULE (50,000 UNITS TOTAL) BY MOUTH EVERY 7 (SEVEN) DAYS., Disp: 12 capsule, Rfl: 0  No Known Allergies   ROS  Constitutional: Negative for fever or weight change.  Respiratory: Negative for cough and shortness of breath.   Cardiovascular: Negative for chest pain or palpitations.  Gastrointestinal: Negative for abdominal pain, no bowel changes.  Musculoskeletal: Negative for gait problem or joint swelling.  Skin: Negative for rash.  Neurological: Negative for dizziness or headache.  No other specific complaints in a complete review of systems (except as listed in HPI above).  Skin problem- This is not a new problem He reports cyst to posterior neck for years that is bothersome to him He says the cyst was lanced twice but continues to return He denies fevers, pain, drainage The cyst is underneath his shirt line and becomes irritated from friction frequently  Objective  Vitals:   08/22/17 1304  BP: 128/78  Pulse: 81  Resp: 18  Temp: 98.3 F (36.8 C)  TempSrc: Oral  SpO2: 96%  Weight: (!) 318 lb (144.2 kg)  Height: 5\' 9"  (1.753 m)    Body mass index is 46.96 kg/m.  Physical Exam Vital signs reviewed. Constitutional: Patient appears well-developed and well-nourished. Obese. No distress.  HENT: Head: Normocephalic and atraumatic. Ears: B TMs ok, no erythema or effusion; Nose: Nose normal. Mouth/Throat: Oropharynx is clear and moist. No oropharyngeal exudate.  Eyes: Conjunctivae and EOM are normal. Pupils are equal, round, and reactive to light. No scleral  icterus.  Neck: Normal range of motion. Neck supple. No cervical adenopathy. No thyromegaly present.  Cardiovascular: Normal rate, regular rhythm and normal heart sounds. No BLE edema. Distal pulses intact. Pulmonary/Chest: Effort normal and breath sounds normal. No respiratory distress. Abdominal: Soft. Bowel sounds are normal, no distension. There is no tenderness. no masses Musculoskeletal: Normal range of motion, no joint effusions. No gross deformities Neurological: he is alert and oriented to person, place, and time. No cranial nerve deficit. Coordination, balance, strength, speech and gait are normal.  Skin: Skin is warm and dry. No rash noted. No erythema. Nontender, subcutaneous cyst to posterior neck. Psychiatric: Patient has a normal mood and affect. behavior is normal. Judgment and thought content normal.   Diabetic Foot Exam: Diabetic Foot Exam - Simple   Simple Foot Form Visual Inspection No deformities, no ulcerations, no other skin breakdown bilaterally:  Yes Sensation Testing Intact to touch and monofilament testing bilaterally:  Yes Pulse Check Posterior Tibialis and Dorsalis pulse intact bilaterally:  Yes Comments    Assessment & Plan RTC in 6 months for F/U: DM- A1c  Epidermoid cyst of neck Discussed referral to dermatology for further evaluation and he Is agreeable - Ambulatory referral to Dermatology

## 2017-08-22 NOTE — Assessment & Plan Note (Signed)
Continue current medications Update lipid panel - CBC; Future - Comprehensive metabolic panel; Future - Lipid panel; Future - TSH; Future

## 2017-08-22 NOTE — Patient Instructions (Signed)
Please head downstairs for lab work/x-rays. If any of your test results are critically abnormal, you will be contacted right away. Your results may be released to your MyChart for viewing before I am able to provide you with my response. I will contact you within a week about your test results and any recommendations for abnormalities.  Please return in about 6 months for routine Diabetes follow up.   Health Maintenance, Male A healthy lifestyle and preventive care is important for your health and wellness. Ask your health care provider about what schedule of regular examinations is right for you. What should I know about weight and diet? Eat a Healthy Diet  Eat plenty of vegetables, fruits, whole grains, low-fat dairy products, and lean protein.  Do not eat a lot of foods high in solid fats, added sugars, or salt.  Maintain a Healthy Weight Regular exercise can help you achieve or maintain a healthy weight. You should:  Do at least 150 minutes of exercise each week. The exercise should increase your heart rate and make you sweat (moderate-intensity exercise).  Do strength-training exercises at least twice a week.  Watch Your Levels of Cholesterol and Blood Lipids  Have your blood tested for lipids and cholesterol every 5 years starting at 68 years of age. If you are at high risk for heart disease, you should start having your blood tested when you are 69 years old. You may need to have your cholesterol levels checked more often if: ? Your lipid or cholesterol levels are high. ? You are older than 68 years of age. ? You are at high risk for heart disease.  What should I know about cancer screening? Many types of cancers can be detected early and may often be prevented. Lung Cancer  You should be screened every year for lung cancer if: ? You are a current smoker who has smoked for at least 30 years. ? You are a former smoker who has quit within the past 15 years.  Talk to your  health care provider about your screening options, when you should start screening, and how often you should be screened.  Colorectal Cancer  Routine colorectal cancer screening usually begins at 67 years of age and should be repeated every 5-10 years until you are 68 years old. You may need to be screened more often if early forms of precancerous polyps or small growths are found. Your health care provider may recommend screening at an earlier age if you have risk factors for colon cancer.  Your health care provider may recommend using home test kits to check for hidden blood in the stool.  A small camera at the end of a tube can be used to examine your colon (sigmoidoscopy or colonoscopy). This checks for the earliest forms of colorectal cancer.  Prostate and Testicular Cancer  Depending on your age and overall health, your health care provider may do certain tests to screen for prostate and testicular cancer.  Talk to your health care provider about any symptoms or concerns you have about testicular or prostate cancer.  Skin Cancer  Check your skin from head to toe regularly.  Tell your health care provider about any new moles or changes in moles, especially if: ? There is a change in a mole's size, shape, or color. ? You have a mole that is larger than a pencil eraser.  Always use sunscreen. Apply sunscreen liberally and repeat throughout the day.  Protect yourself by wearing long sleeves, pants,  a wide-brimmed hat, and sunglasses when outside.  What should I know about heart disease, diabetes, and high blood pressure?  If you are 19-63 years of age, have your blood pressure checked every 3-5 years. If you are 61 years of age or older, have your blood pressure checked every year. You should have your blood pressure measured twice-once when you are at a hospital or clinic, and once when you are not at a hospital or clinic. Record the average of the two measurements. To check your  blood pressure when you are not at a hospital or clinic, you can use: ? An automated blood pressure machine at a pharmacy. ? A home blood pressure monitor.  Talk to your health care provider about your target blood pressure.  If you are between 90-25 years old, ask your health care provider if you should take aspirin to prevent heart disease.  Have regular diabetes screenings by checking your fasting blood sugar level. ? If you are at a normal weight and have a low risk for diabetes, have this test once every three years after the age of 29. ? If you are overweight and have a high risk for diabetes, consider being tested at a younger age or more often.  A one-time screening for abdominal aortic aneurysm (AAA) by ultrasound is recommended for men aged 9-75 years who are current or former smokers. What should I know about preventing infection? Hepatitis B If you have a higher risk for hepatitis B, you should be screened for this virus. Talk with your health care provider to find out if you are at risk for hepatitis B infection. Hepatitis C Blood testing is recommended for:  Everyone born from [birth year] through 1966.  Anyone with known risk factors for hepatitis C.  Sexually Transmitted Diseases (STDs)  You should be screened each year for STDs including gonorrhea and chlamydia if: ? You are sexually active and are younger than 68 years of age. ? You are older than 68 years of age and your health care provider tells you that you are at risk for this type of infection. ? Your sexual activity has changed since you were last screened and you are at an increased risk for chlamydia or gonorrhea. Ask your health care provider if you are at risk.  Talk with your health care provider about whether you are at high risk of being infected with HIV. Your health care provider may recommend a prescription medicine to help prevent HIV infection.  What else can I do?  Schedule regular health, dental, and  eye exams.  Stay current with your vaccines (immunizations).  Do not use any tobacco products, such as cigarettes, chewing tobacco, and e-cigarettes. If you need help quitting, ask your health care provider.  Limit alcohol intake to no more than 2 drinks per day. One drink equals 12 ounces of beer, 5 ounces of wine, or 1 ounces of hard liquor.  Do not use street drugs.  Do not share needles.  Ask your health care provider for help if you need support or information about quitting drugs.  Tell your health care provider if you often feel depressed.  Tell your health care provider if you have ever been abused or do not feel safe at home. This information is not intended to replace advice given to you by your health care provider. Make sure you discuss any questions you have with your health care provider. Document Released: 09/18/2007 Document Revised: 11/19/2015 Document Reviewed: 12/24/2014 Elsevier Interactive  Patient Education  [employer].

## 2017-09-23 ENCOUNTER — Encounter (HOSPITAL_COMMUNITY): Payer: Self-pay

## 2017-09-23 ENCOUNTER — Ambulatory Visit (HOSPITAL_COMMUNITY)
Admission: EM | Admit: 2017-09-23 | Discharge: 2017-09-23 | Disposition: A | Discharge status: Home / Self Care | Payer: PPO | Attending: Family Medicine | Admitting: Family Medicine

## 2017-09-23 DIAGNOSIS — R1032 Left lower quadrant pain: Secondary | ICD-10-CM | POA: Diagnosis not present

## 2017-09-23 MED ORDER — CIPROFLOXACIN HCL 500 MG PO TABS: 500.0000 mg | ORAL_TABLET | Freq: Two times a day (BID) | ORAL | 0 refills | Status: AC

## 2017-09-23 MED ORDER — METRONIDAZOLE 500 MG PO TABS: 500.0000 mg | ORAL_TABLET | Freq: Three times a day (TID) | ORAL | 0 refills | Status: AC

## 2017-09-23 NOTE — ED Triage Notes (Signed)
Pt presents with complaints of diverticulitis flare up. History of this in the past. Complaints of abdominal pain x 3 days and mild amounts of blood in stool.

## 2017-09-23 NOTE — ED Provider Notes (Signed)
[facility]    CSN: [id number] Arrival date & time: 09/23/17  1004     History   Chief Complaint Chief Complaint  Patient presents with  . Abdominal Pain    HPI Roger Alvarado is a 67 y.o. male.   67 year old male with history of diabetes, diverticulosis/diverticulitis, gout, HLD comes in for 3-day history of left lower quadrant pain.  Patient states this feels consistent with his diverticulitis flare.  States usually flares  happen when he travels, and changes his diet.  States he was  in [facility] this past week.  States pain is most consistent at the left lower quadrant, but can have epigastric and left upper quadrant after eating. Describes pain as "irritation" that is intermittent, worse with eating and bowel movement. He denies nausea/vomiting. Has had some sweats during pain, denies fever, chills, night sweats. States stool has been slightly soft, and had mild blood with wiping. He has still been eating and drinking without problems. Denies weakness, dizziness, syncope. Denies chest pain, shortness of breath, wheezing. Denies urinary symptoms such as frequency, dysuria, hematuria. Denies alcohol use, drug use. Denies abdominal surgeries in the past.      Past Medical History:  Diagnosis Date  . Allergy   . Arthritis   . Asthma    Childhood  . Diabetes mellitus   . Diverticulitis   . Diverticulosis   . Gout   . Gout   . Hyperlipidemia   . Obesity, Class III, BMI 40-49.9 (morbid obesity) ([place])     Patient Active Problem List   Diagnosis Date Noted  . Hyperlipidemia 08/22/2017  . Trigger point of left shoulder region 08/15/2017  . Plantar fasciitis, right 07/06/2017  . Rotator cuff disorder, left 03/09/2017  . Routine general medical examination at a health care facility 09/03/2016  . Type 2 diabetes mellitus ([place]) 08/28/2015  . Gout 08/28/2015  . Morbid obesity ([place]) 08/28/2015  . Seasonal allergies 08/28/2015    History reviewed. No pertinent  surgical history.     Home Medications    Prior to Admission medications   Medication Sig Start Date End Date Taking? Authorizing Provider  aspirin 81 MG tablet Take 81 mg by mouth daily.   Yes [provider]  colchicine 0.6 MG tablet Take 2 tablets by mouth at onset of flare and then 1 tablet 1 hour later. 08/22/17  Yes Lance Sell, NP  cyclobenzaprine (FLEXERIL) 10 MG tablet Take 0.5-1 tablets (5-10 mg total) by mouth 3 (three) times daily as needed for muscle spasms. 11/23/16  Yes Golden Circle, FNP  lisinopril (PRINIVIL,ZESTRIL) 5 MG tablet Take 1 tablet (5 mg total) by mouth daily. 03/17/17  Yes Lance Sell, NP  metFORMIN (GLUCOPHAGE) 1000 MG tablet Take 1 tablet (1,000 mg total) by mouth 2 (two) times daily. 03/17/17  Yes Lance Sell, NP  pravastatin (PRAVACHOL) 40 MG tablet Take 1 tablet (40 mg total) by mouth daily. 03/17/17  Yes Shambley, Delphia Grates, NP  Vitamin D, Ergocalciferol, (DRISDOL) 50000 units CAPS capsule TAKE 1 CAPSULE (50,000 UNITS TOTAL) BY MOUTH EVERY 7 (SEVEN) DAYS. 06/27/17  Yes Lyndal Pulley, DO  ciprofloxacin (CIPRO) 500 MG tablet Take 1 tablet (500 mg total) by mouth 2 (two) times daily for 7 days. 09/23/17 09/30/17  Ok Edwards, PA-C  metroNIDAZOLE (FLAGYL) 500 MG tablet Take 1 tablet (500 mg total) by mouth 3 (three) times daily for 7 days. 09/23/17 09/30/17  Ok Edwards, PA-C  Family History Family History  Problem Relation Age of Onset  . Diabetes Brother   . Hypertension Brother   . Diabetes Brother   . Hypertension Brother   . Diabetes Father   . Hypertension Father   . Diabetes Sister   . Asthma Mother   . Heart attack Maternal Grandfather     Social History Social History   Tobacco Use  . Smoking status: Never Smoker  . Smokeless tobacco: Never Used  Substance Use Topics  . Alcohol use: Yes    Alcohol/week: 1.2 oz    Types: 2 Cans of beer per week  . Drug use: No     Allergies   Patient has no  known allergies.   Review of Systems Review of Systems  Reason unable to perform ROS: See HPI as above.     Physical Exam Triage Vital Signs ED Triage Vitals  Enc Vitals Group     BP 09/23/17 1028 127/87     Pulse Rate 09/23/17 1028 76     Resp 09/23/17 1028 18     Temp 09/23/17 1028 98.6 F (37 C)     Temp src --      SpO2 09/23/17 1028 100 %     Weight --      Height --      Head Circumference --      Peak Flow --      Pain Score 09/23/17 1027 5     Pain Loc --      Pain Edu? --      Excl. in [place]? --    No data found.  Updated Vital Signs BP 127/87   Pulse 76   Temp 98.6 F (37 C)   Resp 18   SpO2 100%   Physical Exam  Constitutional: He is oriented to person, place, and time. He appears well-developed and well-nourished.  Non-toxic appearance. He does not appear ill. No distress.  HENT:  Head: Normocephalic and atraumatic.  Cardiovascular: Normal rate, regular rhythm and normal heart sounds. Exam reveals no gallop and no friction rub.  No murmur heard. Pulmonary/Chest: Effort normal and breath sounds normal. He has no wheezes. He has no rales.  Abdominal: Soft. Bowel sounds are normal. He exhibits no mass. There is no rigidity, no rebound, no guarding and no CVA tenderness.  Exam limited due to body habitus.  Tenderness to palpation of left lower quadrant without rebound or guarding.  Neurological: He is alert and oriented to person, place, and time.  Skin: Skin is warm and dry.  Psychiatric: He has a normal mood and affect. His behavior is normal. Judgment normal.   UC Treatments / Results  Labs (all labs ordered are listed, but only abnormal results are displayed) Labs Reviewed - No data to display  Lab Results  Component Value Date   HGBA1C 7.3 (H) 08/22/2017    EKG None  Radiology No results found.  Procedures Procedures (including critical care time)  Medications Ordered in UC Medications - No data to display  Initial Impression /  Assessment and Plan / UC Course  I have reviewed the triage vital signs and the nursing notes.  Pertinent labs & imaging results that were available during my care of the patient were reviewed by me and considered in my medical decision making (see chart for details).    Offered lab work with CBC/CMP/lipase, patient deferred for now. He is nontoxic in appearance, afebrile without tachycardia, tachypnea. Tenderness to palpation of LLQ without  guarding or rebound. Will treat for diverticulitis flare with flagyl and ciprofloxacin. Strict return precautions given. Patient expresses understanding and agrees to plan.  Final Clinical Impressions(s) / UC Diagnoses   Final diagnoses:  Left lower quadrant pain    ED Prescriptions    Medication Sig Dispense Auth. Provider   metroNIDAZOLE (FLAGYL) 500 MG tablet Take 1 tablet (500 mg total) by mouth 3 (three) times daily for 7 days. 21 tablet Imane Burrough V, PA-C   ciprofloxacin (CIPRO) 500 MG tablet Take 1 tablet (500 mg total) by mouth 2 (two) times daily for 7 days. 14 tablet Tobin Chad, [place] 09/23/17 1119

## 2017-09-23 NOTE — Discharge Instructions (Signed)
We will treat you for a diverticulitis flare today.  Start Flagyl and ciprofloxacin as directed.  As discussed, left sided and mid abdomen pain could also be caused by other things such as liver, pancreas, gallbladder problems.  If experiencing worsening symptoms, nausea, vomiting, worsening abdominal pain, worsening blood in stool, weakness, dizziness, go to the emergency department for further evaluation.  Otherwise follow-up with your PCP for reevaluation in 7 days.

## 2017-09-28 NOTE — Progress Notes (Signed)
[facility] [place], [place] [address] Phone: [phone] Subjective:    I'm seeing this patient by the request  of:    CC: Left shoulder pain follow-up  [id number]  Roger Alvarado is a 67 y.o. male coming in with complaint of left shoulder pain.  Was found to have a rotator cuff tear previously that seem to be well-healing.  Patient was doing much better after an injection 10 weeks ago.  Patient states some soreness noted with certain range of motion and when he lays on the side has a pain mostly on the top of the shoulder.  Patient did have x-rays previously that did show some acromioclavicular arthritis.  Patient rates the severity of pain is 5 out of 10.    Past Medical History:  Diagnosis Date  . Allergy   . Arthritis   . Asthma    Childhood  . Diabetes mellitus   . Diverticulitis   . Diverticulosis   . Gout   . Gout   . Hyperlipidemia   . Obesity, Class III, BMI 40-49.9 (morbid obesity) ([place])    No past surgical history on file. Social History   Socioeconomic History  . Marital status: Married    Spouse name: Not on file  . Number of children: 2  . Years of education: 59  . Highest education level: Not on file  Occupational History  . Occupation: Retired  Scientific laboratory technician  . Financial resource strain: Not on file  . Food insecurity:    Worry: Not on file    Inability: Not on file  . Transportation needs:    Medical: Not on file    Non-medical: Not on file  Tobacco Use  . Smoking status: Never Smoker  . Smokeless tobacco: Never Used  Substance and Sexual Activity  . Alcohol use: Yes    Alcohol/week: 1.2 oz    Types: 2 Cans of beer per week  . Drug use: No  . Sexual activity: Not on file  Lifestyle  . Physical activity:    Days per week: Not on file    Minutes per session: Not on file  . Stress: Not on file  Relationships  . Social connections:    Talks on phone: Not on file    Gets together: Not on file      Attends religious service: Not on file    Active member of club or organization: Not on file    Attends meetings of clubs or organizations: Not on file    Relationship status: Not on file  Other Topics Concern  . Not on file  Social History Narrative   Fun: Watch sports, gardening    No Known Allergies Family History  Problem Relation Age of Onset  . Diabetes Brother   . Hypertension Brother   . Diabetes Brother   . Hypertension Brother   . Diabetes Father   . Hypertension Father   . Diabetes Sister   . Asthma Mother   . Heart attack Maternal Grandfather      Past medical history, social, surgical and family history all reviewed in electronic medical record.  No pertanent information unless stated regarding to the chief complaint.   Review of Systems:Review of systems updated and as accurate as of 09/29/17  No headache, visual changes, nausea, vomiting, diarrhea, constipation, dizziness, abdominal pain, skin rash, fevers, chills, night sweats, weight loss, swollen lymph nodes, body aches, joint swelling, muscle aches, chest pain, shortness  of breath, mood changes.   Objective  Blood pressure 140/84, pulse 93, height 5\' 9"  (1.753 m), weight (!) 320 lb (145.2 kg), SpO2 96 %. Systems examined below as of 09/29/17   General: No apparent distress alert and oriented x3 mood and affect normal, dressed appropriately.  HEENT: Pupils equal, extraocular movements intact  Respiratory: Patient's speak in full sentences and does not appear short of breath  Cardiovascular: No lower extremity edema, non tender, no erythema  Skin: Warm dry intact with no signs of infection or rash on extremities or on axial skeleton.  Abdomen: Soft nontender  Neuro: Cranial nerves II through XII are intact, neurovascularly intact in all extremities with 2+ DTRs and 2+ pulses.  Lymph: No lymphadenopathy of posterior or anterior cervical chain or axillae bilaterally.  Gait normal with good balance and  coordination.  MSK:  Non tender with full range of motion and good stability and symmetric strength and tone of  elbows, wrist, hip, knee and ankles bilaterally.  Left shoulder exam shows the patient has 4+ out of 5 strength noted.  Patient does have mild positive impingement still noted.  Patient does have positive crossover with tenderness over the acromioclavicular joint.  Crepitus noted with range of motion over the acromioclavicular joint  Procedure: Real-time Ultrasound Guided Injection of left acromial clavicular joint Device: GE Logiq Q7 Ultrasound guided injection is preferred based studies that show increased duration, increased effect, greater accuracy, decreased procedural pain, increased response rate, and decreased cost with ultrasound guided versus blind injection.  Verbal informed consent obtained.  Time-out conducted.  Noted no overlying erythema, induration, or other signs of local infection.  Skin prepped in a sterile fashion.  Local anesthesia: Topical Ethyl chloride.  With sterile technique and under real time ultrasound guidance: With a 25-gauge 1 inch needle patient was injected with a total of 0.5 cc of 0.5% Marcaine and 0.5 cc of Kenalog 40 mg/mL Completed without difficulty  Pain immediately resolved suggesting accurate placement of the medication.  Advised to call if fevers/chills, erythema, induration, drainage, or persistent bleeding.  Images permanently stored and available for review in the ultrasound unit.  Impression: Technically successful ultrasound guided injection.    Impression and Recommendations:     This case required medical decision making of moderate complexity.      Note: This dictation was prepared with Dragon dictation along with smaller phrase technology. Any transcriptional errors that result from this process are unintentional.

## 2017-09-29 ENCOUNTER — Ambulatory Visit (INDEPENDENT_AMBULATORY_CARE_PROVIDER_SITE_OTHER): Payer: PPO | Admitting: Family Medicine

## 2017-09-29 ENCOUNTER — Encounter: Payer: Self-pay | Admitting: Family Medicine

## 2017-09-29 ENCOUNTER — Ambulatory Visit: Payer: Self-pay

## 2017-09-29 VITALS — BP 140/84 | HR 93 | Ht 69.0 in | Wt 320.0 lb

## 2017-09-29 DIAGNOSIS — M25519 Pain in unspecified shoulder: Secondary | ICD-10-CM

## 2017-09-29 DIAGNOSIS — M67912 Unspecified disorder of synovium and tendon, left shoulder: Secondary | ICD-10-CM

## 2017-09-29 DIAGNOSIS — G8929 Other chronic pain: Secondary | ICD-10-CM | POA: Diagnosis not present

## 2017-09-29 DIAGNOSIS — M19019 Primary osteoarthritis, unspecified shoulder: Secondary | ICD-10-CM | POA: Insufficient documentation

## 2017-09-29 DIAGNOSIS — M19012 Primary osteoarthritis, left shoulder: Secondary | ICD-10-CM

## 2017-09-29 NOTE — Assessment & Plan Note (Signed)
Patient given injection and tolerated procedure well.  We discussed icing regimen and home exercises.  Discussed which activities to doing which wants to avoid.  Patient is to increase activity slowly over the course the next several weeks.  Follow-up with me again in 6 to 12 weeks.

## 2017-09-29 NOTE — Patient Instructions (Signed)
Good to see you  Injected AC joint today  I think otherwise doing well  Keep it up  See me again in 6-12 weeks

## 2017-09-29 NOTE — Assessment & Plan Note (Signed)
I believe the patient is doing relatively well at this time.  Continue same medications same exercises follow-up in 6 to 12 weeks

## 2017-10-21 DIAGNOSIS — L720 Epidermal cyst: Secondary | ICD-10-CM | POA: Diagnosis not present

## 2017-10-26 ENCOUNTER — Other Ambulatory Visit: Payer: Self-pay | Admitting: Nurse Practitioner

## 2017-11-13 ENCOUNTER — Other Ambulatory Visit: Payer: Self-pay | Admitting: Nurse Practitioner

## 2017-11-13 DIAGNOSIS — E119 Type 2 diabetes mellitus without complications: Secondary | ICD-10-CM

## 2017-12-01 DIAGNOSIS — K5792 Diverticulitis of intestine, part unspecified, without perforation or abscess without bleeding: Secondary | ICD-10-CM | POA: Diagnosis not present

## 2017-12-05 ENCOUNTER — Other Ambulatory Visit: Payer: Self-pay | Admitting: Nurse Practitioner

## 2018-02-16 ENCOUNTER — Other Ambulatory Visit: Payer: Self-pay | Admitting: *Deleted

## 2018-02-16 DIAGNOSIS — E119 Type 2 diabetes mellitus without complications: Secondary | ICD-10-CM

## 2018-02-16 MED ORDER — LISINOPRIL 5 MG PO TABS: 5.0000 mg | ORAL_TABLET | Freq: Every day | ORAL | 0 refills | Status: DC

## 2018-02-27 ENCOUNTER — Telehealth: Payer: Self-pay

## 2018-02-27 NOTE — Telephone Encounter (Signed)
Copied from [place] [id number]. Topic: General - Other >> Feb 23, 2018 11:19 AM Roger Alvarado A wrote: Pt called in, he says his insurance is needing a note from his doctor stating why he was sent to Physical Therapy, did he get an injection, diagnosis ect. He says we can mail it to his home address. He says if we have any other questions to call him and he will specify.

## 2018-02-27 NOTE — Telephone Encounter (Signed)
Left patient a message with medical records phone number.

## 2018-04-07 ENCOUNTER — Encounter: Payer: Self-pay | Admitting: Family Medicine

## 2018-04-07 ENCOUNTER — Ambulatory Visit (INDEPENDENT_AMBULATORY_CARE_PROVIDER_SITE_OTHER): Payer: PPO | Admitting: Family Medicine

## 2018-04-07 VITALS — BP 144/92 | HR 79 | Resp 16 | Ht 69.0 in | Wt 322.0 lb

## 2018-04-07 DIAGNOSIS — M79671 Pain in right foot: Secondary | ICD-10-CM

## 2018-04-07 NOTE — Assessment & Plan Note (Addendum)
Appears to have a ganglion on dorsal midfoot. Unclear of source. Doesn't appear to be infected.  - aspiration today  - counseled on supportive care - can f/u if returns to aspirate  - consider imaging.

## 2018-04-07 NOTE — Progress Notes (Signed)
Roger Alvarado - 68 y.o. male MRN [id number]  Date of birth: 06-09-1950  SUBJECTIVE:  Including CC & ROS.  No chief complaint on file.   Roger Alvarado is a 68 y.o. male that is  Presenting with a cyst on the dorsal aspect of his right foot. He noticed this after working outside this past weekend. Feels like staying the same. No prior history of similar symptoms. Localized to the dorsal forefoot. Pain is mild. No redness or warmth. Denies any trauma or injury. No prior surgery. Symptoms are constant.    Review of Systems  Constitutional: Negative for fever.  HENT: Negative for congestion.   Respiratory: Negative for cough.   Cardiovascular: Negative for chest pain.  Gastrointestinal: Negative for abdominal pain.  Musculoskeletal: Negative for joint swelling.  Skin: Negative for color change.  Neurological: Negative for weakness.  Hematological: Negative for adenopathy.  Psychiatric/Behavioral: Negative for agitation.    HISTORY: Past Medical, Surgical, Social, and Family History Reviewed & Updated per EMR.   Pertinent Historical Findings include:  Past Medical History:  Diagnosis Date  . Allergy   . Arthritis   . Asthma    Childhood  . Diabetes mellitus   . Diverticulitis   . Diverticulosis   . Gout   . Gout   . Hyperlipidemia   . Obesity, Class III, BMI 40-49.9 (morbid obesity) ([place])     No past surgical history on file.  No Known Allergies  Family History  Problem Relation Age of Onset  . Diabetes Brother   . Hypertension Brother   . Diabetes Brother   . Hypertension Brother   . Diabetes Father   . Hypertension Father   . Diabetes Sister   . Asthma Mother   . Heart attack Maternal Grandfather      Social History   Socioeconomic History  . Marital status: Married    Spouse name: Not on file  . Number of children: 2  . Years of education: 40  . Highest education level: Not on file  Occupational History  . Occupation: Retired  Scientific laboratory technician  .  Financial resource strain: Not on file  . Food insecurity:    Worry: Not on file    Inability: Not on file  . Transportation needs:    Medical: Not on file    Non-medical: Not on file  Tobacco Use  . Smoking status: Never Smoker  . Smokeless tobacco: Never Used  Substance and Sexual Activity  . Alcohol use: Yes    Alcohol/week: 2.0 standard drinks    Types: 2 Cans of beer per week  . Drug use: No  . Sexual activity: Not on file  Lifestyle  . Physical activity:    Days per week: Not on file    Minutes per session: Not on file  . Stress: Not on file  Relationships  . Social connections:    Talks on phone: Not on file    Gets together: Not on file    Attends religious service: Not on file    Active member of club or organization: Not on file    Attends meetings of clubs or organizations: Not on file    Relationship status: Not on file  . Intimate partner violence:    Fear of current or ex partner: Not on file    Emotionally abused: Not on file    Physically abused: Not on file    Forced sexual activity: Not on file  Other Topics Concern  .  Not on file  Social History Narrative   Fun: Watch sports, gardening      PHYSICAL EXAM:  VS: BP (!) 144/92   Pulse 79   Resp 16   Ht 5\' 9"  (1.753 m)   Wt (!) 322 lb (146.1 kg)   SpO2 97%   BMI 47.55 kg/m  Physical Exam Gen: NAD, alert, cooperative with exam, well-appearing ENT: normal lips, normal nasal mucosa,  Eye: normal EOM, normal conjunctiva and lids CV:  no edema, +2 pedal pulses   Resp: no accessory muscle use, non-labored,  Skin: no rashes, no areas of induration  Neuro: normal tone, normal sensation to touch Psych:  normal insight, alert and oriented MSK:  Right foot:  Mobile cyst on dorsal aspect of foot  No overlying redness or ecchymosis  Normal ROM  Normal strength to resistance  Neurovascularly intact    Aspiration/Injection Procedure Note Roger Alvarado July 07, 1950  Procedure:  Aspiration Indications: right dorsal foot cyst  Procedure Details Consent: Risks of procedure as well as the alternatives and risks of each were explained to the (patient/caregiver).  Consent for procedure obtained. Time Out: Verified patient identification, verified procedure, site/side was marked, verified correct patient position, special equipment/implants available, medications/allergies/relevent history reviewed, required imaging and test results available.  Performed.  The area was cleaned with iodine and alcohol swabs.    The right dorsal foot cyst was injected using 4 cc's of 1% lidocaine with a 25 1 1/2" needle. An 18 gauge 1 1/2" on 60 cc syringe was used for aspiration.  Ultrasound was used. Images were obtained in long views showing the injection.    Amount of Fluid Aspirated: 39mL Character of Fluid: gelatinous Fluid was sent for:n/a A sterile dressing was applied.  Patient did tolerate procedure well.      ASSESSMENT & PLAN:   Right foot pain Appears to have a ganglion on dorsal midfoot. Unclear of source. Doesn't appear to be infected.  - aspiration today  - counseled on supportive care - can f/u if returns to aspirate  - consider imaging.

## 2018-04-07 NOTE — Patient Instructions (Signed)
Nice to meet you  Please try to keep the foot clean  Please follow up with me in 2-3 weeks if the cyst pops up.

## 2018-04-14 ENCOUNTER — Ambulatory Visit: Payer: PPO | Admitting: Family Medicine

## 2018-05-01 ENCOUNTER — Other Ambulatory Visit: Payer: Self-pay | Admitting: Nurse Practitioner

## 2018-05-01 DIAGNOSIS — M109 Gout, unspecified: Secondary | ICD-10-CM

## 2018-05-05 ENCOUNTER — Ambulatory Visit (INDEPENDENT_AMBULATORY_CARE_PROVIDER_SITE_OTHER): Payer: PPO | Admitting: Family Medicine

## 2018-05-05 ENCOUNTER — Encounter: Payer: Self-pay | Admitting: Family Medicine

## 2018-05-05 DIAGNOSIS — M79671 Pain in right foot: Secondary | ICD-10-CM | POA: Diagnosis not present

## 2018-05-05 NOTE — Patient Instructions (Signed)
Good to see you  Please try compression on your foot  Please see me back if it comes back

## 2018-05-05 NOTE — Progress Notes (Signed)
Roger Alvarado - 68 y.o. male MRN [id number]  Date of birth: Mar 03, 1951  SUBJECTIVE:  Including CC & ROS.  No chief complaint on file.   Roger Alvarado is a 68 y.o. male that is  Presenting with worsening of right foot cyst. He had a cyst drained on his foot on 1/3. He presents with pain over this area. Worse with walking. No overlying streaking or redness. No inciting event. Seems to be getting worse. Localized to the dorsal forefoot. No numbness or tingling.   Review of Systems  Constitutional: Negative for fever.  HENT: Negative for congestion.   Respiratory: Negative for cough.   Cardiovascular: Negative for chest pain.  Gastrointestinal: Negative for abdominal distention.  Musculoskeletal: Positive for arthralgias.  Skin: Negative for color change.  Neurological: Negative for weakness.  Hematological: Negative for adenopathy.  Psychiatric/Behavioral: Negative for agitation.    HISTORY: Past Medical, Surgical, Social, and Family History Reviewed & Updated per EMR.   Pertinent Historical Findings include:  Past Medical History:  Diagnosis Date  . Allergy   . Arthritis   . Asthma    Childhood  . Diabetes mellitus   . Diverticulitis   . Diverticulosis   . Gout   . Gout   . Hyperlipidemia   . Obesity, Class III, BMI 40-49.9 (morbid obesity) ([place])     No past surgical history on file.  No Known Allergies  Family History  Problem Relation Age of Onset  . Diabetes Brother   . Hypertension Brother   . Diabetes Brother   . Hypertension Brother   . Diabetes Father   . Hypertension Father   . Diabetes Sister   . Asthma Mother   . Heart attack Maternal Grandfather      Social History   Socioeconomic History  . Marital status: Married    Spouse name: Not on file  . Number of children: 2  . Years of education: 46  . Highest education level: Not on file  Occupational History  . Occupation: Retired  Scientific laboratory technician  . Financial resource strain: Not on file  .  Food insecurity:    Worry: Not on file    Inability: Not on file  . Transportation needs:    Medical: Not on file    Non-medical: Not on file  Tobacco Use  . Smoking status: Never Smoker  . Smokeless tobacco: Never Used  Substance and Sexual Activity  . Alcohol use: Yes    Alcohol/week: 2.0 standard drinks    Types: 2 Cans of beer per week  . Drug use: No  . Sexual activity: Not on file  Lifestyle  . Physical activity:    Days per week: Not on file    Minutes per session: Not on file  . Stress: Not on file  Relationships  . Social connections:    Talks on phone: Not on file    Gets together: Not on file    Attends religious service: Not on file    Active member of club or organization: Not on file    Attends meetings of clubs or organizations: Not on file    Relationship status: Not on file  . Intimate partner violence:    Fear of current or ex partner: Not on file    Emotionally abused: Not on file    Physically abused: Not on file    Forced sexual activity: Not on file  Other Topics Concern  . Not on file  Social History Narrative  Fun: Watch sports, gardening      PHYSICAL EXAM:  VS: BP 140/86   Pulse 77   Ht 5\' 9"  (1.753 m)   Wt (!) 322 lb (146.1 kg)   SpO2 98%   BMI 47.55 kg/m  Physical Exam Gen: NAD, alert, cooperative with exam, well-appearing ENT: normal lips, normal nasal mucosa,  Eye: normal EOM, normal conjunctiva and lids CV:  no edema, +2 pedal pulses   Resp: no accessory muscle use, non-labored,   Skin: no rashes, no areas of induration  Neuro: normal tone, normal sensation to touch Psych:  normal insight, alert and oriented MSK:  Right foot:  Mobile soft cyst on dorsal forefoot. Occurring over the 3rd and 4th MT  No redness or ecchymosis  Mild TTP over the cyst  Neurovascularly intact.    Aspiration/Injection Procedure Note Roger Alvarado Oct 26, 1950  Procedure: Aspiration Indications: right foot dorsal cyst   Procedure  Details Consent: Risks of procedure as well as the alternatives and risks of each were explained to the (patient/caregiver).  Consent for procedure obtained. Time Out: Verified patient identification, verified procedure, site/side was marked, verified correct patient position, special equipment/implants available, medications/allergies/relevent history reviewed, required imaging and test results available.  Performed.  The area was cleaned with iodine and alcohol swabs.    The right foot dorsal cyst was aspirated with 18 1 1/2" needle.    Amount of Fluid Aspirated: minimal amount Character of Fluid: gelatinous Fluid was sent for:n/a  A sterile dressing was applied.  Patient did tolerate procedure well.        ASSESSMENT & PLAN:   Right foot pain Unable to aspirate but was able to express gelatinous material. Likely cystic. No signs of infection  - aspiration and expression today  - counseled on supportive care - given indications to return.  - may need to consider surgery if returns.

## 2018-05-05 NOTE — Assessment & Plan Note (Signed)
Unable to aspirate but was able to express gelatinous material. Likely cystic. No signs of infection  - aspiration and expression today  - counseled on supportive care - given indications to return.  - may need to consider surgery if returns.

## 2018-05-15 ENCOUNTER — Other Ambulatory Visit: Payer: Self-pay | Admitting: Emergency Medicine

## 2018-05-15 DIAGNOSIS — E119 Type 2 diabetes mellitus without complications: Secondary | ICD-10-CM

## 2018-05-15 MED ORDER — LISINOPRIL 5 MG PO TABS: 5.0000 mg | ORAL_TABLET | Freq: Every day | ORAL | 0 refills | Status: DC

## 2018-05-15 MED ORDER — METFORMIN HCL 1000 MG PO TABS: 1000.0000 mg | ORAL_TABLET | Freq: Two times a day (BID) | ORAL | 0 refills | Status: DC

## 2018-06-23 ENCOUNTER — Other Ambulatory Visit: Payer: Self-pay | Admitting: Nurse Practitioner

## 2018-06-23 DIAGNOSIS — M109 Gout, unspecified: Secondary | ICD-10-CM

## 2018-07-10 ENCOUNTER — Other Ambulatory Visit: Payer: Self-pay | Admitting: *Deleted

## 2018-07-10 MED ORDER — PRAVASTATIN SODIUM 40 MG PO TABS: 40.0000 mg | ORAL_TABLET | Freq: Every day | ORAL | 0 refills | Status: DC

## 2018-07-20 ENCOUNTER — Other Ambulatory Visit: Payer: Self-pay | Admitting: *Deleted

## 2018-07-20 DIAGNOSIS — M109 Gout, unspecified: Secondary | ICD-10-CM

## 2018-07-20 MED ORDER — COLCHICINE 0.6 MG PO TABS: ORAL_TABLET | ORAL | 1 refills | Status: DC

## 2018-08-16 ENCOUNTER — Other Ambulatory Visit: Payer: Self-pay | Admitting: *Deleted

## 2018-08-16 DIAGNOSIS — E119 Type 2 diabetes mellitus without complications: Secondary | ICD-10-CM

## 2018-08-16 MED ORDER — LISINOPRIL 5 MG PO TABS: 5.0000 mg | ORAL_TABLET | Freq: Every day | ORAL | 0 refills | Status: DC

## 2018-08-16 NOTE — Telephone Encounter (Signed)
ACEI done x 1 mo  Needs ROV for further refills  Ok for change pcp to me

## 2018-08-16 NOTE — Telephone Encounter (Signed)
Dr. Jenny Reichmann,  [facility] to schedule transfer of care with you?  LOV with PCP: 08/22/17

## 2018-08-16 NOTE — Telephone Encounter (Signed)
OV scheduled with Dr. Jenny Reichmann for TOC/CPE on 08/24/18 @ 1:20 pm.

## 2018-08-22 ENCOUNTER — Other Ambulatory Visit: Payer: Self-pay | Admitting: *Deleted

## 2018-08-22 MED ORDER — METFORMIN HCL 1000 MG PO TABS: 1000.0000 mg | ORAL_TABLET | Freq: Two times a day (BID) | ORAL | 0 refills | Status: DC

## 2018-08-24 ENCOUNTER — Other Ambulatory Visit: Payer: Self-pay | Admitting: Internal Medicine

## 2018-08-24 ENCOUNTER — Ambulatory Visit (INDEPENDENT_AMBULATORY_CARE_PROVIDER_SITE_OTHER): Payer: PPO | Admitting: Internal Medicine

## 2018-08-24 ENCOUNTER — Other Ambulatory Visit: Payer: Self-pay

## 2018-08-24 ENCOUNTER — Encounter: Payer: Self-pay | Admitting: Internal Medicine

## 2018-08-24 ENCOUNTER — Other Ambulatory Visit (INDEPENDENT_AMBULATORY_CARE_PROVIDER_SITE_OTHER): Payer: PPO

## 2018-08-24 VITALS — BP 144/96 | HR 82 | Temp 98.3°F | Ht 69.0 in | Wt 314.0 lb

## 2018-08-24 DIAGNOSIS — E538 Deficiency of other specified B group vitamins: Secondary | ICD-10-CM

## 2018-08-24 DIAGNOSIS — Z0000 Encounter for general adult medical examination without abnormal findings: Secondary | ICD-10-CM

## 2018-08-24 DIAGNOSIS — E611 Iron deficiency: Secondary | ICD-10-CM

## 2018-08-24 DIAGNOSIS — M109 Gout, unspecified: Secondary | ICD-10-CM

## 2018-08-24 DIAGNOSIS — E119 Type 2 diabetes mellitus without complications: Secondary | ICD-10-CM

## 2018-08-24 DIAGNOSIS — E559 Vitamin D deficiency, unspecified: Secondary | ICD-10-CM

## 2018-08-24 LAB — IBC PANEL
Iron: 135 ug/dL (ref 42–165)
Saturation Ratios: 47.5 % (ref 20.0–50.0)
Transferrin: 203 mg/dL — ABNORMAL LOW (ref 212.0–360.0)

## 2018-08-24 LAB — CBC WITH DIFFERENTIAL/PLATELET
Basophils Absolute: 0 10*3/uL (ref 0.0–0.1)
Basophils Relative: 0.5 % (ref 0.0–3.0)
Eosinophils Absolute: 0.2 10*3/uL (ref 0.0–0.7)
Eosinophils Relative: 3.1 % (ref 0.0–5.0)
HCT: 46.1 % (ref 39.0–52.0)
Hemoglobin: 15.6 g/dL (ref 13.0–17.0)
Lymphocytes Relative: 34.9 % (ref 12.0–46.0)
Lymphs Abs: 2.6 10*3/uL (ref 0.7–4.0)
MCHC: 33.8 g/dL (ref 30.0–36.0)
MCV: 91.6 fl (ref 78.0–100.0)
Monocytes Absolute: 0.7 10*3/uL (ref 0.1–1.0)
Monocytes Relative: 9.6 % (ref 3.0–12.0)
Neutro Abs: 3.8 10*3/uL (ref 1.4–7.7)
Neutrophils Relative %: 51.9 % (ref 43.0–77.0)
Platelets: 199 10*3/uL (ref 150.0–400.0)
RBC: 5.04 Mil/uL (ref 4.22–5.81)
RDW: 14.3 % (ref 11.5–15.5)
WBC: 7.4 10*3/uL (ref 4.0–10.5)

## 2018-08-24 LAB — BASIC METABOLIC PANEL
BUN: 16 mg/dL (ref 6–23)
CO2: 22 mEq/L (ref 19–32)
Calcium: 9.5 mg/dL (ref 8.4–10.5)
Chloride: 105 mEq/L (ref 96–112)
Creatinine, Ser: 1.17 mg/dL (ref 0.40–1.50)
GFR: 74.82 mL/min (ref >60.00)
Glucose, Bld: 130 mg/dL — ABNORMAL HIGH (ref 70–99)
Potassium: 4.4 mEq/L (ref 3.5–5.1)
Sodium: 139 mEq/L (ref 135–145)

## 2018-08-24 LAB — MICROALBUMIN / CREATININE URINE RATIO
Creatinine,U: 151.7 mg/dL
Microalb Creat Ratio: 3.8 mg/g (ref 0.0–30.0)
Microalb, Ur: 5.8 mg/dL — ABNORMAL HIGH (ref 0.0–1.9)

## 2018-08-24 LAB — HEPATIC FUNCTION PANEL
ALT: 24 U/L (ref 0–53)
AST: 21 U/L (ref 0–37)
Albumin: 4.4 g/dL (ref 3.5–5.2)
Alkaline Phosphatase: 69 U/L (ref 39–117)
Bilirubin, Direct: 0.2 mg/dL (ref 0.0–0.3)
Total Bilirubin: 1 mg/dL (ref 0.2–1.2)
Total Protein: 7.4 g/dL (ref 6.0–8.3)

## 2018-08-24 LAB — URINALYSIS, ROUTINE W REFLEX MICROSCOPIC
Bilirubin Urine: NEGATIVE
Hgb urine dipstick: NEGATIVE
Ketones, ur: NEGATIVE
Leukocytes,Ua: NEGATIVE
Nitrite: NEGATIVE
RBC / HPF: NONE SEEN (ref 0–?)
Specific Gravity, Urine: 1.025 (ref 1.000–1.030)
Total Protein, Urine: NEGATIVE
Urine Glucose: NEGATIVE
Urobilinogen, UA: 0.2 (ref 0.0–1.0)
pH: 5.5 (ref 5.0–8.0)

## 2018-08-24 LAB — LIPID PANEL
Cholesterol: 159 mg/dL (ref 0–200)
HDL: 47.3 mg/dL (ref >39.00)
LDL Cholesterol: 84 mg/dL (ref 0–99)
NonHDL: 111.92
Total CHOL/HDL Ratio: 3
Triglycerides: 139 mg/dL (ref 0.0–149.0)
VLDL: 27.8 mg/dL (ref 0.0–40.0)

## 2018-08-24 LAB — PSA: PSA: 0.57 ng/mL (ref 0.10–4.00)

## 2018-08-24 LAB — VITAMIN B12: Vitamin B-12: 167 pg/mL — ABNORMAL LOW (ref 211–911)

## 2018-08-24 LAB — URIC ACID: Uric Acid, Serum: 9.3 mg/dL — ABNORMAL HIGH (ref 4.0–7.8)

## 2018-08-24 LAB — HEMOGLOBIN A1C: Hgb A1c MFr Bld: 7 % — ABNORMAL HIGH (ref 4.6–6.5)

## 2018-08-24 LAB — VITAMIN D 25 HYDROXY (VIT D DEFICIENCY, FRACTURES): VITD: 15.35 ng/mL — ABNORMAL LOW (ref 30.00–100.00)

## 2018-08-24 LAB — TSH: TSH: 1.57 u[IU]/mL (ref 0.35–4.50)

## 2018-08-24 MED ORDER — LISINOPRIL 10 MG PO TABS: 10.0000 mg | ORAL_TABLET | Freq: Every day | ORAL | 3 refills | Status: DC

## 2018-08-24 MED ORDER — METFORMIN HCL 1000 MG PO TABS: 1000.0000 mg | ORAL_TABLET | Freq: Two times a day (BID) | ORAL | 3 refills | Status: DC

## 2018-08-24 MED ORDER — PRAVASTATIN SODIUM 40 MG PO TABS: 40.0000 mg | ORAL_TABLET | Freq: Every day | ORAL | 3 refills | Status: DC

## 2018-08-24 MED ORDER — COLCHICINE 0.6 MG PO TABS: ORAL_TABLET | ORAL | 5 refills | Status: DC

## 2018-08-24 MED ORDER — VITAMIN D (ERGOCALCIFEROL) 1.25 MG (50000 UNIT) PO CAPS: 50000.0000 [IU] | ORAL_CAPSULE | ORAL | 0 refills | Status: DC

## 2018-08-24 NOTE — Progress Notes (Signed)
Subjective:    Patient ID: Roger Alvarado, male    DOB: 1951-03-10, 68 y.o.   MRN: [id number]  HPI  Here for wellness and f/u;  Overall doing ok;  Pt denies Chest pain, worsening SOB, DOE, wheezing, orthopnea, PND, worsening LE edema, palpitations, dizziness or syncope.  Pt denies neurological change such as new headache, facial or extremity weakness.  Pt denies polydipsia, polyuria, or low sugar symptoms. Pt states overall good compliance with treatment and medications, good tolerability, and has been trying to follow appropriate diet.  Pt denies worsening depressive symptoms, suicidal ideation or panic. No fever, night sweats, wt loss, loss of appetite, or other constitutional symptoms.  Pt states good ability with ADL's, has low fall risk, home safety reviewed and adequate, no other significant changes in hearing or vision, and only occasionally active with exercise.  No new complaints  BP at home usually < 140/90 Past Medical History:  Diagnosis Date  . Allergy   . Arthritis   . Asthma    Childhood  . Diabetes mellitus   . Diverticulitis   . Diverticulosis   . Gout   . Gout   . Hyperlipidemia   . Obesity, Class III, BMI 40-49.9 (morbid obesity) ([place])    No past surgical history on file.  reports that he has never smoked. He has never used smokeless tobacco. He reports current alcohol use of about 2.0 standard drinks of alcohol per week. He reports that he does not use drugs. family history includes Asthma in his mother; Diabetes in his brother, brother, father, and sister; Heart attack in his maternal grandfather; Hypertension in his brother, brother, and father. No Known Allergies Current Outpatient Medications on File Prior to Visit  Medication Sig Dispense Refill  . aspirin 81 MG tablet Take 81 mg by mouth daily.     No current facility-administered medications on file prior to visit.    Review of Systems Constitutional: Negative for other unusual diaphoresis, sweats, appetite  or weight changes HENT: Negative for other worsening hearing loss, ear pain, facial swelling, mouth sores or neck stiffness.   Eyes: Negative for other worsening pain, redness or other visual disturbance.  Respiratory: Negative for other stridor or swelling Cardiovascular: Negative for other palpitations or other chest pain  Gastrointestinal: Negative for worsening diarrhea or loose stools, blood in stool, distention or other pain Genitourinary: Negative for hematuria, flank pain or other change in urine volume.  Musculoskeletal: Negative for myalgias or other joint swelling.  Skin: Negative for other color change, or other wound or worsening drainage.  Neurological: Negative for other syncope or numbness. Hematological: Negative for other adenopathy or swelling Psychiatric/Behavioral: Negative for hallucinations, other worsening agitation, SI, self-injury, or new decreased concentration All other system neg per pt    Objective:   Physical Exam BP (!) 144/96   Pulse 82   Temp 98.3 F (36.8 C) (Oral)   Ht 5\' 9"  (1.753 m)   Wt (!) 314 lb (142.4 kg)   SpO2 96%   BMI 46.37 kg/m  VS noted,  Constitutional: Pt is oriented to person, place, and time. Appears well-developed and well-nourished, in no significant distress and comfortable Head: Normocephalic and atraumatic  Eyes: Conjunctivae and EOM are normal. Pupils are equal, round, and reactive to light Right Ear: External ear normal without discharge Left Ear: External ear normal without discharge Nose: Nose without discharge or deformity Mouth/Throat: Oropharynx is without other ulcerations and moist  Neck: Normal range of motion. Neck supple. No  JVD present. No tracheal deviation present or significant neck LA or mass Cardiovascular: Normal rate, regular rhythm, normal heart sounds and intact distal pulses.   Pulmonary/Chest: WOB normal and breath sounds without rales or wheezing  Abdominal: Soft. Bowel sounds are normal. NT. No HSM   Musculoskeletal: Normal range of motion. Exhibits no edema Lymphadenopathy: Has no other cervical adenopathy.  Neurological: Pt is alert and oriented to person, place, and time. Pt has normal reflexes. No cranial nerve deficit. Motor grossly intact, Gait intact Skin: Skin is warm and dry. No rash noted or new ulcerations Psychiatric:  Has normal mood and affect. Behavior is normal without agitation No other exam findings Lab Results  Component Value Date   WBC 7.4 08/24/2018   HGB 15.6 08/24/2018   HCT 46.1 08/24/2018   PLT 199.0 08/24/2018   GLUCOSE 130 (H) 08/24/2018   CHOL 159 08/24/2018   TRIG 139.0 08/24/2018   HDL 47.30 08/24/2018   LDLCALC 84 08/24/2018   ALT 24 08/24/2018   AST 21 08/24/2018   NA 139 08/24/2018   K 4.4 08/24/2018   CL 105 08/24/2018   CREATININE 1.17 08/24/2018   BUN 16 08/24/2018   CO2 22 08/24/2018   TSH 1.57 08/24/2018   PSA 0.57 08/24/2018   HGBA1C 7.0 (H) 08/24/2018   MICROALBUR 5.8 (H) 08/24/2018      Assessment & Plan:

## 2018-08-24 NOTE — Patient Instructions (Signed)

## 2018-08-25 ENCOUNTER — Telehealth: Payer: Self-pay

## 2018-08-25 NOTE — Telephone Encounter (Signed)
Pt has viewed results via MyChart  

## 2018-08-25 NOTE — Telephone Encounter (Signed)
-----   Message from Biagio Borg, MD sent at 08/24/2018  6:54 PM EDT ----- Left message on MyChart, pt to cont same tx except  The test results show that your current treatment is OK, except the Vitamin D level is quite low, the Vitamin B12 level is low, and iron level is also low.    We need to: 1) start Vitamin D 50000 units weekly for 12 weeks, then plan to take OTC Vitamin D3 at 2000 units per day 2)  Start monthly B12 shots until your next visit  Shirron to please inform pt, I will do rx , and arrange for monthly b12 shots until next seen

## 2018-08-26 ENCOUNTER — Encounter: Payer: Self-pay | Admitting: Internal Medicine

## 2018-08-26 DIAGNOSIS — E538 Deficiency of other specified B group vitamins: Secondary | ICD-10-CM | POA: Insufficient documentation

## 2018-08-26 DIAGNOSIS — E559 Vitamin D deficiency, unspecified: Secondary | ICD-10-CM | POA: Insufficient documentation

## 2018-08-26 NOTE — Assessment & Plan Note (Signed)
For lab f/u 

## 2018-08-26 NOTE — Assessment & Plan Note (Signed)
stable overall by history and exam, recent data reviewed with pt, and pt to continue medical treatment as before,  to f/u any worsening symptoms or concerns, for a1c with labs 

## 2018-08-26 NOTE — Assessment & Plan Note (Signed)
fo rlab f/u

## 2018-08-26 NOTE — Assessment & Plan Note (Signed)
For uric acid with labs

## 2018-08-26 NOTE — Assessment & Plan Note (Signed)

## 2018-08-29 ENCOUNTER — Telehealth: Payer: Self-pay

## 2018-08-29 NOTE — Telephone Encounter (Signed)
Please schedule monthly nurse visit for b12 injections.  Copied from [place] [id number]. Topic: General - Other >> Aug 29, 2018 12:01 PM Leward Quan A wrote: Reason for CRM: Patient called to say that he would like to go to the next step with the B-12 states that he saw on My Chart that his levels were low and Dr Jenny Reichmann want him getting B-12 shots asking for a call back please.  Ph#  [phone]

## 2018-08-29 NOTE — Telephone Encounter (Signed)
LVM for patient to call back and make appt  °

## 2018-09-05 NOTE — Telephone Encounter (Signed)
Patient called back after hours to schedule B12 shot. [phone]

## 2018-09-05 NOTE — Telephone Encounter (Signed)
LVM for patient to call back and make appt  °

## 2018-09-07 ENCOUNTER — Ambulatory Visit (INDEPENDENT_AMBULATORY_CARE_PROVIDER_SITE_OTHER): Payer: PPO

## 2018-09-07 ENCOUNTER — Other Ambulatory Visit: Payer: Self-pay

## 2018-09-07 DIAGNOSIS — E538 Deficiency of other specified B group vitamins: Secondary | ICD-10-CM

## 2018-09-07 MED ORDER — CYANOCOBALAMIN 1000 MCG/ML IJ SOLN
1000.0000 ug | Freq: Once | INTRAMUSCULAR | Status: AC
  Administered 2018-09-07: 1000 ug via INTRAMUSCULAR

## 2018-09-07 NOTE — Progress Notes (Signed)
Medical screening examination/treatment/procedure(s) were performed by non-physician practitioner and as supervising physician I was immediately available for consultation/collaboration. I agree with above. Meline Russaw, MD   

## 2018-09-09 ENCOUNTER — Other Ambulatory Visit: Payer: Self-pay | Admitting: Internal Medicine

## 2018-09-09 DIAGNOSIS — E119 Type 2 diabetes mellitus without complications: Secondary | ICD-10-CM

## 2018-09-11 NOTE — Telephone Encounter (Deleted)
I do not see this medication on his list. Please advise.

## 2018-10-11 ENCOUNTER — Ambulatory Visit (INDEPENDENT_AMBULATORY_CARE_PROVIDER_SITE_OTHER): Payer: PPO

## 2018-10-11 ENCOUNTER — Other Ambulatory Visit: Payer: Self-pay

## 2018-10-11 DIAGNOSIS — E538 Deficiency of other specified B group vitamins: Secondary | ICD-10-CM | POA: Diagnosis not present

## 2018-10-11 MED ORDER — CYANOCOBALAMIN 1000 MCG/ML IJ SOLN
1000.0000 ug | Freq: Once | INTRAMUSCULAR | Status: AC
  Administered 2018-10-11: 1000 ug via INTRAMUSCULAR

## 2018-10-11 NOTE — Progress Notes (Signed)
Medical screening examination/treatment/procedure(s) were performed by non-physician practitioner and as supervising physician I was immediately available for consultation/collaboration. I agree with above.  , MD   

## 2018-11-09 ENCOUNTER — Ambulatory Visit (INDEPENDENT_AMBULATORY_CARE_PROVIDER_SITE_OTHER): Payer: PPO

## 2018-11-09 ENCOUNTER — Other Ambulatory Visit: Payer: Self-pay

## 2018-11-09 DIAGNOSIS — E538 Deficiency of other specified B group vitamins: Secondary | ICD-10-CM | POA: Diagnosis not present

## 2018-11-09 MED ORDER — CYANOCOBALAMIN 1000 MCG/ML IJ SOLN
1000.0000 ug | Freq: Once | INTRAMUSCULAR | Status: AC
  Administered 2018-11-09: 1000 ug via INTRAMUSCULAR

## 2018-11-09 NOTE — Progress Notes (Signed)
Medical screening examination/treatment/procedure(s) were performed by non-physician practitioner and as supervising physician I was immediately available for consultation/collaboration. I agree with above. James John, MD   

## 2018-11-15 ENCOUNTER — Other Ambulatory Visit: Payer: Self-pay | Admitting: Internal Medicine

## 2018-12-13 ENCOUNTER — Ambulatory Visit (INDEPENDENT_AMBULATORY_CARE_PROVIDER_SITE_OTHER): Payer: PPO

## 2018-12-13 ENCOUNTER — Other Ambulatory Visit: Payer: Self-pay

## 2018-12-13 DIAGNOSIS — E538 Deficiency of other specified B group vitamins: Secondary | ICD-10-CM | POA: Diagnosis not present

## 2018-12-13 DIAGNOSIS — Z23 Encounter for immunization: Secondary | ICD-10-CM | POA: Diagnosis not present

## 2018-12-13 MED ORDER — CYANOCOBALAMIN 1000 MCG/ML IJ SOLN
1000.0000 ug | Freq: Once | INTRAMUSCULAR | Status: AC
  Administered 2018-12-13: 1000 ug via INTRAMUSCULAR

## 2018-12-25 ENCOUNTER — Other Ambulatory Visit: Payer: Self-pay

## 2018-12-25 ENCOUNTER — Encounter: Payer: Self-pay | Admitting: Internal Medicine

## 2018-12-25 ENCOUNTER — Ambulatory Visit (INDEPENDENT_AMBULATORY_CARE_PROVIDER_SITE_OTHER): Payer: PPO | Admitting: Internal Medicine

## 2018-12-25 VITALS — BP 126/84 | HR 85 | Temp 98.0°F | Ht 69.0 in | Wt 311.0 lb

## 2018-12-25 DIAGNOSIS — M79671 Pain in right foot: Secondary | ICD-10-CM

## 2018-12-25 DIAGNOSIS — E538 Deficiency of other specified B group vitamins: Secondary | ICD-10-CM

## 2018-12-25 DIAGNOSIS — E559 Vitamin D deficiency, unspecified: Secondary | ICD-10-CM | POA: Diagnosis not present

## 2018-12-25 DIAGNOSIS — E119 Type 2 diabetes mellitus without complications: Secondary | ICD-10-CM | POA: Diagnosis not present

## 2018-12-25 MED ORDER — VITAMIN B-12 1000 MCG PO TABS: 1000.0000 ug | ORAL_TABLET | Freq: Every day | ORAL | 3 refills | Status: DC

## 2018-12-25 NOTE — Assessment & Plan Note (Signed)
C/w probable ganglion cystic lesions, for podiatry referral

## 2018-12-25 NOTE — Assessment & Plan Note (Signed)
To continue oral replacement, f/u lab next labs

## 2018-12-25 NOTE — Progress Notes (Signed)
Subjective:    Patient ID: Roger Alvarado, male    DOB: Dec 18, 1950, 68 y.o.   MRN: [id number]  HPI  Here with persistent distal right foot ganglion cyst lesions aspirated at least twice per pt per sport med, last time jan 2020 , no cortisone injection but cystic lesions keep recurring, fortunately mild to mod, constant, nothing makes better or worse.  Pt denies chest pain, increased sob or doe, wheezing, orthopnea, PND, increased LE swelling, palpitations, dizziness or syncope.  Pt denies new neurological symptoms such as new headache, or facial or extremity weakness or numbness   Pt denies polydipsia, polyuria,  Past Medical History:  Diagnosis Date  . Allergy   . Arthritis   . Asthma    Childhood  . Diabetes mellitus   . Diverticulitis   . Diverticulosis   . Gout   . Gout   . Hyperlipidemia   . Obesity, Class III, BMI 40-49.9 (morbid obesity) ([place])    No past surgical history on file.  reports that he has never smoked. He has never used smokeless tobacco. He reports current alcohol use of about 2.0 standard drinks of alcohol per week. He reports that he does not use drugs. family history includes Asthma in his mother; Diabetes in his brother, brother, father, and sister; Heart attack in his maternal grandfather; Hypertension in his brother, brother, and father. No Known Allergies Current Outpatient Medications on File Prior to Visit  Medication Sig Dispense Refill  . aspirin 81 MG tablet Take 81 mg by mouth daily.    . colchicine 0.6 MG tablet Take 2 tablets by mouth at onset of flare and then 1 tablet 1 hour later 30 tablet 5  . lisinopril (ZESTRIL) 10 MG tablet Take 1 tablet (10 mg total) by mouth daily. 90 tablet 3  . metFORMIN (GLUCOPHAGE) 1000 MG tablet Take 1 tablet (1,000 mg total) by mouth 2 (two) times daily. 180 tablet 3  . pravastatin (PRAVACHOL) 40 MG tablet Take 1 tablet (40 mg total) by mouth daily. Needs visit with labs. 90 tablet 3  . Vitamin D, Ergocalciferol,  (DRISDOL) 1.25 MG (50000 UT) CAPS capsule Take 1 capsule (50,000 Units total) by mouth every 7 (seven) days. 12 capsule 0   No current facility-administered medications on file prior to visit.    Review of Systems  Constitutional: Negative for other unusual diaphoresis or sweats HENT: Negative for ear discharge or swelling Eyes: Negative for other worsening visual disturbances Respiratory: Negative for stridor or other swelling  Gastrointestinal: Negative for worsening distension or other blood Genitourinary: Negative for retention or other urinary change Musculoskeletal: Negative for other MSK pain or swelling Skin: Negative for color change or other new lesions Neurological: Negative for worsening tremors and other numbness  Psychiatric/Behavioral: Negative for worsening agitation or other fatigue All otherwise neg per pt    Objective:   Physical Exam BP 126/84   Pulse 85   Temp 98 F (36.7 C) (Oral)   Ht 5\' 9"  (1.753 m)   Wt (!) 311 lb (141.1 kg)   SpO2 98%   BMI 45.93 kg/m  VS noted,  Constitutional: Pt appears in NAD HENT: Head: NCAT.  Right Ear: External ear normal.  Left Ear: External ear normal.  Eyes: . Pupils are equal, round, and reactive to light. Conjunctivae and EOM are normal Nose: without d/c or deformity Neck: Neck supple. Gross normal ROM Cardiovascular: Normal rate and regular rhythm.   Pulmonary/Chest: Effort normal and breath sounds without rales  or wheezing.  Abd:  Soft, NT, ND, + BS, no organomegaly Neurological: Pt is alert. At baseline orientation, motor grossly intact Skin: Skin is warm, no LE edema, right dorsal distal foot with possible 3 cystic lesions mild tender without overlying skin change Psychiatric: Pt behavior is normal without agitation  All otherwise neg per pt Lab Results  Component Value Date   WBC 7.4 08/24/2018   HGB 15.6 08/24/2018   HCT 46.1 08/24/2018   PLT 199.0 08/24/2018   GLUCOSE 130 (H) 08/24/2018   CHOL 159  08/24/2018   TRIG 139.0 08/24/2018   HDL 47.30 08/24/2018   LDLCALC 84 08/24/2018   ALT 24 08/24/2018   AST 21 08/24/2018   NA 139 08/24/2018   K 4.4 08/24/2018   CL 105 08/24/2018   CREATININE 1.17 08/24/2018   BUN 16 08/24/2018   CO2 22 08/24/2018   TSH 1.57 08/24/2018   PSA 0.57 08/24/2018   HGBA1C 7.0 (H) 08/24/2018   MICROALBUR 5.8 (H) 08/24/2018      Assessment & Plan:

## 2018-12-25 NOTE — Assessment & Plan Note (Signed)
stable overall by history and exam, recent data reviewed with pt, and pt to continue medical treatment as before,  to f/u any worsening symptoms or concerns  

## 2018-12-25 NOTE — Patient Instructions (Addendum)
You will be contacted regarding the referral for: podiatry (foot doctor)  OK to have 2 more shots for the B12 in October and November, but after that start taking the B12 pills - 1 per day  Please continue your OTC VitD 3 at 2000 units per day  Please continue all other medications as before, and refills have been done if requested.  Please have the pharmacy call with any other refills you may need.  Please continue your efforts at being more active, low cholesterol diet, and weight  Please keep your appointments with your specialists as you may have planned

## 2019-01-12 ENCOUNTER — Ambulatory Visit (INDEPENDENT_AMBULATORY_CARE_PROVIDER_SITE_OTHER): Payer: PPO

## 2019-01-12 DIAGNOSIS — E538 Deficiency of other specified B group vitamins: Secondary | ICD-10-CM

## 2019-01-12 MED ORDER — CYANOCOBALAMIN 1000 MCG/ML IJ SOLN
1000.0000 ug | Freq: Once | INTRAMUSCULAR | Status: AC
  Administered 2019-01-12: 1000 ug via INTRAMUSCULAR

## 2019-01-18 ENCOUNTER — Ambulatory Visit: Payer: PPO | Admitting: Podiatry

## 2019-01-18 ENCOUNTER — Other Ambulatory Visit: Payer: Self-pay

## 2019-01-18 ENCOUNTER — Other Ambulatory Visit: Payer: Self-pay | Admitting: Podiatry

## 2019-01-18 ENCOUNTER — Encounter: Payer: Self-pay | Admitting: Podiatry

## 2019-01-18 ENCOUNTER — Ambulatory Visit (INDEPENDENT_AMBULATORY_CARE_PROVIDER_SITE_OTHER): Payer: PPO

## 2019-01-18 VITALS — BP 129/85

## 2019-01-18 DIAGNOSIS — M79671 Pain in right foot: Secondary | ICD-10-CM

## 2019-01-18 DIAGNOSIS — M67471 Ganglion, right ankle and foot: Secondary | ICD-10-CM | POA: Diagnosis not present

## 2019-01-18 NOTE — Progress Notes (Signed)
b12 Injection given.   Roger Wyrick [name], MD  

## 2019-01-19 ENCOUNTER — Ambulatory Visit: Payer: PPO | Admitting: Podiatry

## 2019-01-19 ENCOUNTER — Encounter: Payer: Self-pay | Admitting: Podiatry

## 2019-01-19 NOTE — Progress Notes (Signed)
Subjective:  Patient ID: Roger Alvarado, male    DOB: 12-12-1950,  MRN: [id number]  Chief Complaint  Patient presents with  . Foot Pain    pt is here for a ganglion cyst of the right foot, that needss to be drained, pt states that it has been going on since november    68 y.o. male presents with the above complaint.  Patient states that these ganglion cyst have been very painful.  He has multiple lobulated ganglion cyst on the right dorsal aspect of his foot.  They have been going on for about 11 months.  Prior to that he had aspiration and injection procedure of the ganglion cyst.  This gave him temporary relief.  However the ganglion cyst have returned.  The pain is elevated when he is applying pressure especially while ambulating.  Patient states that the cyst have moved to the toes little bit more than last time.  He denies any other acute complaints.   Review of Systems: Negative except as noted in the HPI. Denies N/V/F/Ch.  Past Medical History:  Diagnosis Date  . Allergy   . Arthritis   . Asthma    Childhood  . Diabetes mellitus   . Diverticulitis   . Diverticulosis   . Gout   . Gout   . Hyperlipidemia   . Obesity, Class III, BMI 40-49.9 (morbid obesity) ([facility])     Current Outpatient Medications:  .  aspirin 81 MG tablet, Take 81 mg by mouth daily., Disp: , Rfl:  .  colchicine 0.6 MG tablet, Take 2 tablets by mouth at onset of flare and then 1 tablet 1 hour later, Disp: 30 tablet, Rfl: 5 .  lisinopril (ZESTRIL) 10 MG tablet, Take 1 tablet (10 mg total) by mouth daily., Disp: 90 tablet, Rfl: 3 .  metFORMIN (GLUCOPHAGE) 1000 MG tablet, Take 1 tablet (1,000 mg total) by mouth 2 (two) times daily., Disp: 180 tablet, Rfl: 3 .  pravastatin (PRAVACHOL) 40 MG tablet, Take 1 tablet (40 mg total) by mouth daily. Needs visit with labs., Disp: 90 tablet, Rfl: 3 .  vitamin B-12 (CYANOCOBALAMIN) 1000 MCG tablet, Take 1 tablet (1,000 mcg total) by mouth daily., Disp: 90 tablet, Rfl: 3 .   Vitamin D, Ergocalciferol, (DRISDOL) 1.25 MG (50000 UT) CAPS capsule, Take 1 capsule (50,000 Units total) by mouth every 7 (seven) days., Disp: 12 capsule, Rfl: 0  Social History   Tobacco Use  Smoking Status Never Smoker  Smokeless Tobacco Never Used    No Known Allergies Objective:   Vitals:   01/18/19 1619  BP: 129/85   There is no height or weight on file to calculate BMI. Constitutional Well developed. Well nourished.  Vascular Dorsalis pedis pulses palpable bilaterally. Posterior tibial pulses palpable bilaterally. Capillary refill normal to all digits.  No cyanosis or clubbing noted. Pedal hair growth normal.  Neurologic Normal speech. Oriented to person, place, and time. Epicritic sensation to light touch grossly present bilaterally.  Dermatologic  multiple ganglion cysts noted x3.  They are lobulated easily palpable.  Positive transillumination.  Not indurated.  Pain on palpation to the cyst.  Orthopedic: Normal joint ROM without pain or crepitus bilaterally. No visible deformities. No bony tenderness.   Radiographs: None Assessment:   1. Foot pain, right   2. Ganglion cyst of right foot    Plan:  Patient was evaluated and treated and all questions answered.  Right foot dorsal ganglion cyst x3 (dorsal medial foot, dorsal lateral foot, dorsal proximal  foot, -All 3 cyst were removed in the exact manners.  Using 18-gauge needle and 10 cc syringe the cyst were drained of all gelatinous material.  The material was discarded.  Following thorough exsanguination of the cyst 10 cc Kenalog injections were injected to prevent recurrence. -Patient was placed in a cam boot given the significant/extensive and multiple lobulated cyst to allow for proper healing. -I explained to the patient that the cam boot is temporary and he can continue to wear it for next couple of weeks after that he can transition to regular sneakers.  If there are any other concerns per any acute signs of  infection I told him to call me immediately or go to the emergency department right away.    No follow-ups on file.

## 2019-02-26 ENCOUNTER — Other Ambulatory Visit (INDEPENDENT_AMBULATORY_CARE_PROVIDER_SITE_OTHER): Payer: PPO

## 2019-02-26 ENCOUNTER — Ambulatory Visit (INDEPENDENT_AMBULATORY_CARE_PROVIDER_SITE_OTHER): Payer: PPO | Admitting: Internal Medicine

## 2019-02-26 ENCOUNTER — Encounter: Payer: Self-pay | Admitting: Internal Medicine

## 2019-02-26 ENCOUNTER — Other Ambulatory Visit: Payer: Self-pay

## 2019-02-26 VITALS — BP 126/88 | HR 92 | Temp 98.1°F | Ht 69.0 in | Wt 306.0 lb

## 2019-02-26 DIAGNOSIS — M79671 Pain in right foot: Secondary | ICD-10-CM

## 2019-02-26 DIAGNOSIS — E119 Type 2 diabetes mellitus without complications: Secondary | ICD-10-CM

## 2019-02-26 DIAGNOSIS — E538 Deficiency of other specified B group vitamins: Secondary | ICD-10-CM | POA: Diagnosis not present

## 2019-02-26 DIAGNOSIS — E785 Hyperlipidemia, unspecified: Secondary | ICD-10-CM | POA: Diagnosis not present

## 2019-02-26 DIAGNOSIS — M109 Gout, unspecified: Secondary | ICD-10-CM

## 2019-02-26 DIAGNOSIS — E559 Vitamin D deficiency, unspecified: Secondary | ICD-10-CM | POA: Diagnosis not present

## 2019-02-26 LAB — BASIC METABOLIC PANEL
BUN: 23 mg/dL (ref 6–23)
CO2: 23 mEq/L (ref 19–32)
Calcium: 10 mg/dL (ref 8.4–10.5)
Chloride: 109 mEq/L (ref 96–112)
Creatinine, Ser: 1.26 mg/dL (ref 0.40–1.50)
GFR: 68.59 mL/min (ref >60.00)
Glucose, Bld: 123 mg/dL — ABNORMAL HIGH (ref 70–99)
Potassium: 5.1 mEq/L (ref 3.5–5.1)
Sodium: 141 mEq/L (ref 135–145)

## 2019-02-26 LAB — LIPID PANEL
Cholesterol: 156 mg/dL (ref 0–200)
HDL: 47.4 mg/dL (ref >39.00)
LDL Cholesterol: 87 mg/dL (ref 0–99)
NonHDL: 108.82
Total CHOL/HDL Ratio: 3
Triglycerides: 107 mg/dL (ref 0.0–149.0)
VLDL: 21.4 mg/dL (ref 0.0–40.0)

## 2019-02-26 LAB — HEPATIC FUNCTION PANEL
ALT: 35 U/L (ref 0–53)
AST: 22 U/L (ref 0–37)
Albumin: 4.1 g/dL (ref 3.5–5.2)
Alkaline Phosphatase: 64 U/L (ref 39–117)
Bilirubin, Direct: 0.1 mg/dL (ref 0.0–0.3)
Total Bilirubin: 0.6 mg/dL (ref 0.2–1.2)
Total Protein: 7.5 g/dL (ref 6.0–8.3)

## 2019-02-26 LAB — HEMOGLOBIN A1C: Hgb A1c MFr Bld: 6.8 % — ABNORMAL HIGH (ref 4.6–6.5)

## 2019-02-26 MED ORDER — PRAVASTATIN SODIUM 80 MG PO TABS: 80.0000 mg | ORAL_TABLET | Freq: Every day | ORAL | 3 refills | Status: DC

## 2019-02-26 NOTE — Patient Instructions (Addendum)
OK to stop the  B12 shots for now, and we can check your level next visit  [place] to increase the pravastatin to 80 mg per day  Please continue all other medications as before, and refills have been done if requested.  Please have the pharmacy call with any other refills you may need.  Please continue your efforts at being more active, low cholesterol diet, and weight control.  Please keep your appointments with your specialists as you may have planned  Please go to the LAB in the Basement (turn left off the elevator) for the tests to be done today  You will be contacted by phone if any changes need to be made immediately.  Otherwise, you will receive a letter about your results with an explanation, but please check with MyChart first.  Please remember to sign up for MyChart if you have not done so, as this will be important to you in the future with finding out test results, communicating by private email, and scheduling acute appointments online when needed.  Please return in 6 months, or sooner if needed, with Lab testing done 3-5 days before

## 2019-02-26 NOTE — Progress Notes (Signed)
Subjective:    Patient ID: Roger Alvarado, male    DOB: [birth year], 68 y.o.   MRN: [id number]  HPI    Here to f/u; overall doing ok,  Pt denies chest pain, increasing sob or doe, wheezing, orthopnea, PND, increased LE swelling, palpitations, dizziness or syncope.  Pt denies new neurological symptoms such as new headache, or facial or extremity weakness or numbness.  Pt denies polydipsia, polyuria, or low sugar episode.  Pt states overall good compliance with meds, mostly trying to follow appropriate diet, with wt overall stable,  but little exercise however.  Has eye exam sched for tomorrow.  Did see podiatry for right foot ganglion cyst s/p coritsone now essentially resolved for now.  No other complaints Past Medical History:  Diagnosis Date  . Allergy   . Arthritis   . Asthma    Childhood  . Diabetes mellitus   . Diverticulitis   . Diverticulosis   . Gout   . Gout   . Hyperlipidemia   . Obesity, Class III, BMI 40-49.9 (morbid obesity) ([place])    No past surgical history on file.  reports that he has never smoked. He has never used smokeless tobacco. He reports current alcohol use of about 2.0 standard drinks of alcohol per week. He reports that he does not use drugs. family history includes Asthma in his mother; Diabetes in his brother, brother, father, and sister; Heart attack in his maternal grandfather; Hypertension in his brother, brother, and father. No Known Allergies Current Outpatient Medications on File Prior to Visit  Medication Sig Dispense Refill  . aspirin 81 MG tablet Take 81 mg by mouth daily.    . colchicine 0.6 MG tablet Take 2 tablets by mouth at onset of flare and then 1 tablet 1 hour later 30 tablet 5  . lisinopril (ZESTRIL) 10 MG tablet Take 1 tablet (10 mg total) by mouth daily. 90 tablet 3  . metFORMIN (GLUCOPHAGE) 1000 MG tablet Take 1 tablet (1,000 mg total) by mouth 2 (two) times daily. 180 tablet 3  . vitamin B-12 (CYANOCOBALAMIN) 1000 MCG tablet Take 1  tablet (1,000 mcg total) by mouth daily. 90 tablet 3  . Vitamin D, Ergocalciferol, (DRISDOL) 1.25 MG (50000 UT) CAPS capsule Take 1 capsule (50,000 Units total) by mouth every 7 (seven) days. 12 capsule 0   No current facility-administered medications on file prior to visit.    Review of Systems  Constitutional: Negative for other unusual diaphoresis or sweats HENT: Negative for ear discharge or swelling Eyes: Negative for other worsening visual disturbances Respiratory: Negative for stridor or other swelling  Gastrointestinal: Negative for worsening distension or other blood Genitourinary: Negative for retention or other urinary change Musculoskeletal: Negative for other MSK pain or swelling Skin: Negative for color change or other new lesions Neurological: Negative for worsening tremors and other numbness  Psychiatric/Behavioral: Negative for worsening agitation or other fatigue All otherwise neg per pt     Objective:   Physical Exam BP 126/88   Pulse 92   Temp 98.1 F (36.7 C) (Oral)   Ht 5\' 9"  (1.753 m)   Wt (!) 306 lb (138.8 kg)   SpO2 98%   BMI 45.19 kg/m  VS noted,  Constitutional: Pt appears in NAD HENT: Head: NCAT.  Right Ear: External ear normal.  Left Ear: External ear normal.  Eyes: . Pupils are equal, round, and reactive to light. Conjunctivae and EOM are normal Nose: without d/c or deformity Neck: Neck supple. Gross normal  ROM Cardiovascular: Normal rate and regular rhythm.   Pulmonary/Chest: Effort normal and breath sounds without rales or wheezing.  Abd:  Soft, NT, ND, + BS, no organomegaly Neurological: Pt is alert. At baseline orientation, motor grossly intact Skin: Skin is warm. No rashes, other new lesions, no LE edema Psychiatric: Pt behavior is normal without agitation  All otherwise neg per pt Lab Results  Component Value Date   WBC 7.4 08/24/2018   HGB 15.6 08/24/2018   HCT 46.1 08/24/2018   PLT 199.0 08/24/2018   GLUCOSE 123 (H) 02/26/2019    CHOL 156 02/26/2019   TRIG 107.0 02/26/2019   HDL 47.40 02/26/2019   LDLCALC 87 02/26/2019   ALT 35 02/26/2019   AST 22 02/26/2019   NA 141 02/26/2019   K 5.1 02/26/2019   CL 109 02/26/2019   CREATININE 1.26 02/26/2019   BUN 23 02/26/2019   CO2 23 02/26/2019   TSH 1.57 08/24/2018   PSA 0.57 08/24/2018   HGBA1C 6.8 (H) 02/26/2019   MICROALBUR 5.8 (H) 08/24/2018          Assessment & Plan:

## 2019-02-26 NOTE — Assessment & Plan Note (Signed)
To continue oral replacement 

## 2019-02-26 NOTE — Assessment & Plan Note (Signed)
Ok to d/c shots and cont otc MVI

## 2019-02-27 ENCOUNTER — Other Ambulatory Visit: Payer: Self-pay

## 2019-02-27 LAB — HM DIABETES EYE EXAM

## 2019-03-04 ENCOUNTER — Encounter: Payer: Self-pay | Admitting: Internal Medicine

## 2019-03-04 NOTE — Assessment & Plan Note (Signed)
stable overall by history and exam, recent data reviewed with pt, and pt to continue medical treatment as before,  to f/u any worsening symptoms or concerns  

## 2019-03-04 NOTE — Assessment & Plan Note (Signed)
Mild uncontrolled, for increased pravastatin to 80 mg

## 2019-03-04 NOTE — Assessment & Plan Note (Signed)
Stable, cont same tx 

## 2019-03-04 NOTE — Assessment & Plan Note (Signed)
Resolved with tx of ganglion cyst, follow

## 2019-04-25 DIAGNOSIS — R109 Unspecified abdominal pain: Secondary | ICD-10-CM | POA: Diagnosis not present

## 2019-04-26 ENCOUNTER — Other Ambulatory Visit: Payer: Self-pay | Admitting: Physician Assistant

## 2019-04-26 ENCOUNTER — Ambulatory Visit
Admission: RE | Admit: 2019-04-26 | Discharge: 2019-04-26 | Disposition: A | Discharge status: Home / Self Care | Payer: PPO | Source: Ambulatory Visit | Attending: Physician Assistant | Admitting: Physician Assistant

## 2019-04-26 DIAGNOSIS — K5792 Diverticulitis of intestine, part unspecified, without perforation or abscess without bleeding: Secondary | ICD-10-CM

## 2019-04-26 DIAGNOSIS — R109 Unspecified abdominal pain: Secondary | ICD-10-CM | POA: Diagnosis not present

## 2019-04-26 MED ORDER — IOPAMIDOL (ISOVUE-300) INJECTION 61%
125.0000 mL | Freq: Once | INTRAVENOUS | Status: AC | PRN
  Administered 2019-04-26: 13:00:00 125 mL via INTRAVENOUS

## 2019-04-27 DIAGNOSIS — R935 Abnormal findings on diagnostic imaging of other abdominal regions, including retroperitoneum: Secondary | ICD-10-CM | POA: Diagnosis not present

## 2019-05-07 DIAGNOSIS — K859 Acute pancreatitis without necrosis or infection, unspecified: Secondary | ICD-10-CM | POA: Diagnosis not present

## 2019-06-01 DIAGNOSIS — K8501 Idiopathic acute pancreatitis with uninfected necrosis: Secondary | ICD-10-CM | POA: Diagnosis not present

## 2019-06-06 DIAGNOSIS — K859 Acute pancreatitis without necrosis or infection, unspecified: Secondary | ICD-10-CM | POA: Diagnosis not present

## 2019-06-13 ENCOUNTER — Other Ambulatory Visit: Payer: Self-pay | Admitting: Gastroenterology

## 2019-06-13 DIAGNOSIS — K859 Acute pancreatitis without necrosis or infection, unspecified: Secondary | ICD-10-CM

## 2019-06-25 ENCOUNTER — Other Ambulatory Visit: Payer: PPO

## 2019-07-06 ENCOUNTER — Other Ambulatory Visit: Payer: Self-pay

## 2019-07-06 ENCOUNTER — Ambulatory Visit
Admission: RE | Admit: 2019-07-06 | Discharge: 2019-07-06 | Disposition: A | Discharge status: Home / Self Care | Payer: PPO | Source: Ambulatory Visit | Attending: Gastroenterology | Admitting: Gastroenterology

## 2019-07-06 DIAGNOSIS — K859 Acute pancreatitis without necrosis or infection, unspecified: Secondary | ICD-10-CM

## 2019-07-06 DIAGNOSIS — N200 Calculus of kidney: Secondary | ICD-10-CM | POA: Diagnosis not present

## 2019-07-06 MED ORDER — IOPAMIDOL (ISOVUE-300) INJECTION 61%
125.0000 mL | Freq: Once | INTRAVENOUS | Status: AC | PRN
  Administered 2019-07-06: 12:00:00 125 mL via INTRAVENOUS

## 2019-08-07 ENCOUNTER — Ambulatory Visit (INDEPENDENT_AMBULATORY_CARE_PROVIDER_SITE_OTHER): Payer: PPO | Admitting: Internal Medicine

## 2019-08-07 ENCOUNTER — Other Ambulatory Visit: Payer: Self-pay

## 2019-08-07 VITALS — BP 140/80 | HR 107 | Temp 98.5°F | Ht 69.0 in | Wt 306.0 lb

## 2019-08-07 DIAGNOSIS — Z0000 Encounter for general adult medical examination without abnormal findings: Secondary | ICD-10-CM | POA: Diagnosis not present

## 2019-08-07 DIAGNOSIS — E559 Vitamin D deficiency, unspecified: Secondary | ICD-10-CM

## 2019-08-07 DIAGNOSIS — N200 Calculus of kidney: Secondary | ICD-10-CM

## 2019-08-07 DIAGNOSIS — R310 Gross hematuria: Secondary | ICD-10-CM | POA: Diagnosis not present

## 2019-08-07 DIAGNOSIS — E538 Deficiency of other specified B group vitamins: Secondary | ICD-10-CM

## 2019-08-07 DIAGNOSIS — R350 Frequency of micturition: Secondary | ICD-10-CM | POA: Diagnosis not present

## 2019-08-07 DIAGNOSIS — E119 Type 2 diabetes mellitus without complications: Secondary | ICD-10-CM | POA: Diagnosis not present

## 2019-08-07 LAB — CBC WITH DIFFERENTIAL/PLATELET
Basophils Absolute: 0 10*3/uL (ref 0.0–0.1)
Basophils Relative: 0.3 % (ref 0.0–3.0)
Eosinophils Absolute: 0.1 10*3/uL (ref 0.0–0.7)
Eosinophils Relative: 0.8 % (ref 0.0–5.0)
HCT: 47.9 % (ref 39.0–52.0)
Hemoglobin: 15.7 g/dL (ref 13.0–17.0)
Lymphocytes Relative: 16.7 % (ref 12.0–46.0)
Lymphs Abs: 2.3 10*3/uL (ref 0.7–4.0)
MCHC: 32.7 g/dL (ref 30.0–36.0)
MCV: 92.6 fl (ref 78.0–100.0)
Monocytes Absolute: 1.3 10*3/uL — ABNORMAL HIGH (ref 0.1–1.0)
Monocytes Relative: 9.9 % (ref 3.0–12.0)
Neutro Abs: 9.8 10*3/uL — ABNORMAL HIGH (ref 1.4–7.7)
Neutrophils Relative %: 72.3 % (ref 43.0–77.0)
Platelets: 180 10*3/uL (ref 150.0–400.0)
RBC: 5.18 Mil/uL (ref 4.22–5.81)
RDW: 14.5 % (ref 11.5–15.5)
WBC: 13.6 10*3/uL — ABNORMAL HIGH (ref 4.0–10.5)

## 2019-08-07 LAB — LIPID PANEL
Cholesterol: 183 mg/dL (ref 0–200)
HDL: 59.1 mg/dL (ref >39.00)
LDL Cholesterol: 101 mg/dL — ABNORMAL HIGH (ref 0–99)
NonHDL: 123.78
Total CHOL/HDL Ratio: 3
Triglycerides: 112 mg/dL (ref 0.0–149.0)
VLDL: 22.4 mg/dL (ref 0.0–40.0)

## 2019-08-07 LAB — MICROALBUMIN / CREATININE URINE RATIO
Creatinine,U: 188 mg/dL
Microalb Creat Ratio: 72 mg/g — ABNORMAL HIGH (ref 0.0–30.0)
Microalb, Ur: 135.3 mg/dL — ABNORMAL HIGH (ref 0.0–1.9)

## 2019-08-07 LAB — HEPATIC FUNCTION PANEL
ALT: 25 U/L (ref 0–53)
AST: 21 U/L (ref 0–37)
Albumin: 4.4 g/dL (ref 3.5–5.2)
Alkaline Phosphatase: 68 U/L (ref 39–117)
Bilirubin, Direct: 0.4 mg/dL — ABNORMAL HIGH (ref 0.0–0.3)
Total Bilirubin: 2.3 mg/dL — ABNORMAL HIGH (ref 0.2–1.2)
Total Protein: 7.5 g/dL (ref 6.0–8.3)

## 2019-08-07 LAB — BASIC METABOLIC PANEL
BUN: 21 mg/dL (ref 6–23)
CO2: 28 mEq/L (ref 19–32)
Calcium: 9.9 mg/dL (ref 8.4–10.5)
Chloride: 101 mEq/L (ref 96–112)
Creatinine, Ser: 1.43 mg/dL (ref 0.40–1.50)
GFR: 59.19 mL/min — ABNORMAL LOW (ref >60.00)
Glucose, Bld: 131 mg/dL — ABNORMAL HIGH (ref 70–99)
Potassium: 3.8 mEq/L (ref 3.5–5.1)
Sodium: 139 mEq/L (ref 135–145)

## 2019-08-07 LAB — VITAMIN B12: Vitamin B-12: 206 pg/mL — ABNORMAL LOW (ref 211–911)

## 2019-08-07 LAB — HEMOGLOBIN A1C: Hgb A1c MFr Bld: 7 % — ABNORMAL HIGH (ref 4.6–6.5)

## 2019-08-07 LAB — TSH: TSH: 2.16 u[IU]/mL (ref 0.35–4.50)

## 2019-08-07 LAB — VITAMIN D 25 HYDROXY (VIT D DEFICIENCY, FRACTURES): VITD: 28.07 ng/mL — ABNORMAL LOW (ref 30.00–100.00)

## 2019-08-07 LAB — PSA: PSA: 5.92 ng/mL — ABNORMAL HIGH (ref 0.10–4.00)

## 2019-08-07 NOTE — Progress Notes (Signed)
Subjective:    Patient ID: Roger Alvarado, male    DOB: 08-24-1950, 69 y.o.   MRN: [id number]  HPI  Here for wellness and f/u;  Overall doing ok;  Pt denies Chest pain, worsening SOB, DOE, wheezing, orthopnea, PND, worsening LE edema, palpitations, dizziness or syncope.  Pt denies neurological change such as new headache, facial or extremity weakness.  Pt denies polydipsia, polyuria, or low sugar symptoms. Pt states overall good compliance with treatment and medications, good tolerability, and has been trying to follow appropriate diet.  Pt denies worsening depressive symptoms, suicidal ideation or panic. No fever, night sweats, wt loss, loss of appetite, or other constitutional symptoms.  Pt states good ability with ADL's, has low fall risk, home safety reviewed and adequate, no other significant changes in hearing or vision, and only occasionally active with exercise. Wt Readings from Last 3 Encounters:  08/07/19 (!) 306 lb (138.8 kg)  02/26/19 (!) 306 lb (138.8 kg)  12/25/18 (!) 311 lb (141.1 kg)  Also c/o 3-4 days onset urinary frequency and urgency with near incontinence with standing, sveeral, and trace gross blood, also hx of renal stone with mild left side and flank pain Past Medical History:  Diagnosis Date  . Allergy   . Arthritis   . Asthma    Childhood  . Diabetes mellitus   . Diverticulitis   . Diverticulosis   . Gout   . Gout   . Hyperlipidemia   . Obesity, Class III, BMI 40-49.9 (morbid obesity) ([place])    No past surgical history on file.  reports that he has never smoked. He has never used smokeless tobacco. He reports current alcohol use of about 2.0 standard drinks of alcohol per week. He reports that he does not use drugs. family history includes Asthma in his mother; Diabetes in his brother, brother, father, and sister; Heart attack in his maternal grandfather; Hypertension in his brother, brother, and father. No Known Allergies ' Current Outpatient Medications on  File Prior to Visit  Medication Sig Dispense Refill  . aspirin 81 MG tablet Take 81 mg by mouth daily.    . colchicine 0.6 MG tablet Take 2 tablets by mouth at onset of flare and then 1 tablet 1 hour later 30 tablet 5  . lisinopril (ZESTRIL) 10 MG tablet Take 1 tablet (10 mg total) by mouth daily. 90 tablet 3  . metFORMIN (GLUCOPHAGE) 1000 MG tablet Take 1 tablet (1,000 mg total) by mouth 2 (two) times daily. 180 tablet 3  . pravastatin (PRAVACHOL) 80 MG tablet Take 1 tablet (80 mg total) by mouth daily. 90 tablet 3   No current facility-administered medications on file prior to visit.   Review of Systems All otherwise neg per pt     Objective:   Physical Exam BP 140/80 (BP Location: Left Arm, Patient Position: Sitting, Cuff Size: Large)   Pulse (!) 107   Temp 98.5 F (36.9 C) (Oral)   Ht 5\' 9"  (1.753 m)   Wt (!) 306 lb (138.8 kg)   SpO2 95%   BMI 45.19 kg/m  VS noted,  Constitutional: Pt appears in NAD HENT: Head: NCAT.  Right Ear: External ear normal.  Left Ear: External ear normal.  Eyes: . Pupils are equal, round, and reactive to light. Conjunctivae and EOM are normal Nose: without d/c or deformity Neck: Neck supple. Gross normal ROM Cardiovascular: Normal rate and regular rhythm.   Pulmonary/Chest: Effort normal and breath sounds without rales or wheezing.  Abd:  Soft, NT, ND, + BS, no organomegaly Neurological: Pt is alert. At baseline orientation, motor grossly intact Skin: Skin is warm. No rashes, other new lesions, no LE edema Psychiatric: Pt behavior is normal without agitation  All otherwise neg per pt  Lab Results  Component Value Date   WBC 7.4 08/24/2018   HGB 15.6 08/24/2018   HCT 46.1 08/24/2018   PLT 199.0 08/24/2018   GLUCOSE 123 (H) 02/26/2019   CHOL 156 02/26/2019   TRIG 107.0 02/26/2019   HDL 47.40 02/26/2019   LDLCALC 87 02/26/2019   ALT 35 02/26/2019   AST 22 02/26/2019   NA 141 02/26/2019   K 5.1 02/26/2019   CL 109 02/26/2019    CREATININE 1.26 02/26/2019   BUN 23 02/26/2019   CO2 23 02/26/2019   TSH 1.57 08/24/2018   PSA 0.57 08/24/2018   HGBA1C 6.8 (H) 02/26/2019   MICROALBUR 5.8 (H) 08/24/2018       Assessment & Plan:

## 2019-08-07 NOTE — Patient Instructions (Signed)
Please take all new medication as prescribed - the antibiotic  Please continue all other medications as before, and refills have been done if requested.  Please have the pharmacy call with any other refills you may need.  Please continue your efforts at being more active, low cholesterol diet, and weight control.  You are otherwise up to date with prevention measures today.  Please keep your appointments with your specialists as you may have planned  You will be contacted regarding the referral for: Urology  Please go to the LAB at the blood drawing area for the tests to be done  You will be contacted by phone if any changes need to be made immediately.  Otherwise, you will receive a letter about your results with an explanation, but please check with MyChart first.  Please remember to sign up for MyChart if you have not done so, as this will be important to you in the future with finding out test results, communicating by private email, and scheduling acute appointments online when needed.  Please make an Appointment to return in 6 months, or sooner if needed

## 2019-08-08 ENCOUNTER — Telehealth: Payer: Self-pay

## 2019-08-08 ENCOUNTER — Other Ambulatory Visit: Payer: Self-pay | Admitting: Internal Medicine

## 2019-08-08 ENCOUNTER — Encounter: Payer: Self-pay | Admitting: Internal Medicine

## 2019-08-08 LAB — URINALYSIS, ROUTINE W REFLEX MICROSCOPIC
Nitrite: POSITIVE — AB
Specific Gravity, Urine: 1.025 (ref 1.000–1.030)
Total Protein, Urine: >=300 — AB
Urine Glucose: NEGATIVE
Urobilinogen, UA: 1 (ref 0.0–1.0)
pH: 5.5 (ref 5.0–8.0)

## 2019-08-08 MED ORDER — LEVOFLOXACIN 500 MG PO TABS: 500.0000 mg | ORAL_TABLET | Freq: Every day | ORAL | 0 refills | Status: AC

## 2019-08-08 MED ORDER — VITAMIN D (ERGOCALCIFEROL) 1.25 MG (50000 UNIT) PO CAPS: 50000.0000 [IU] | ORAL_CAPSULE | ORAL | 0 refills | Status: DC

## 2019-08-08 MED ORDER — VITAMIN B-12 1000 MCG PO TABS: 1000.0000 ug | ORAL_TABLET | Freq: Every day | ORAL | 3 refills | Status: AC

## 2019-08-08 MED ORDER — CIPROFLOXACIN HCL 500 MG PO TABS: 500.0000 mg | ORAL_TABLET | Freq: Two times a day (BID) | ORAL | 0 refills | Status: DC

## 2019-08-08 NOTE — Assessment & Plan Note (Signed)
stable overall by history and exam, recent data reviewed with pt, and pt to continue medical treatment as before,  to f/u any worsening symptoms or concerns  

## 2019-08-08 NOTE — Assessment & Plan Note (Signed)

## 2019-08-08 NOTE — Assessment & Plan Note (Signed)
For oral replacement 

## 2019-08-08 NOTE — Telephone Encounter (Signed)
F/u  Seen Dr. Jenny Reichmann yesterday was told an antibiotic would be called into [id number] - [place], [place] - [place]  Asking for a callback

## 2019-08-08 NOTE — Telephone Encounter (Signed)
Patient called and spoke with [facility] on : 08/07/2019 5:44:21 PM and states  he was in the office today and his antibiotic has not been sent to the pharmacy. Caller states he was seen for a UTI and the MD stated he would call an abx into pharmacy but the pharmacy has not received it.  Please advise.  Upon looking in chart, it does not look like it has been called in yet.   [id number] [place], [place] [facility] [place]

## 2019-08-08 NOTE — Telephone Encounter (Signed)
For some reason the cipro would not go through  I sent levaquin instead

## 2019-08-08 NOTE — Telephone Encounter (Signed)
Called pt there was no answer [facility]

## 2019-08-08 NOTE — Assessment & Plan Note (Addendum)
Possible uti vs stone, for films, also urine studies, may need antibx, will refer urology

## 2019-08-09 LAB — URINE CULTURE

## 2019-08-14 ENCOUNTER — Other Ambulatory Visit: Payer: PPO

## 2019-08-14 ENCOUNTER — Other Ambulatory Visit: Payer: Self-pay

## 2019-08-27 ENCOUNTER — Ambulatory Visit: Payer: PPO | Admitting: Internal Medicine

## 2019-08-29 ENCOUNTER — Other Ambulatory Visit: Payer: Self-pay | Admitting: Internal Medicine

## 2019-09-11 ENCOUNTER — Other Ambulatory Visit: Payer: Self-pay | Admitting: Internal Medicine

## 2019-09-11 DIAGNOSIS — M109 Gout, unspecified: Secondary | ICD-10-CM

## 2019-09-11 NOTE — Telephone Encounter (Signed)
Please refill as per office routine med refill policy (all routine meds refilled for 3 mo or monthly per pt preference up to one year from last visit, then month to month grace period for 3 mo, then further med refills will have to be denied)  

## 2019-10-24 ENCOUNTER — Other Ambulatory Visit: Payer: Self-pay | Admitting: Internal Medicine

## 2019-10-24 NOTE — Telephone Encounter (Signed)
Please change to OTC Vitamin D3 at 2000 units per day, indefinitely.  

## 2019-10-25 ENCOUNTER — Other Ambulatory Visit: Payer: Self-pay | Admitting: Internal Medicine

## 2019-11-10 ENCOUNTER — Other Ambulatory Visit: Payer: Self-pay | Admitting: Internal Medicine

## 2019-11-13 ENCOUNTER — Other Ambulatory Visit: Payer: Self-pay | Admitting: Internal Medicine

## 2019-11-14 NOTE — Telephone Encounter (Signed)
Please refill as per office routine med refill policy (all routine meds refilled for 3 mo or monthly per pt preference up to one year from last visit, then month to month grace period for 3 mo, then further med refills will have to be denied)  

## 2019-11-15 ENCOUNTER — Other Ambulatory Visit: Payer: Self-pay

## 2019-11-15 MED ORDER — PRAVASTATIN SODIUM 80 MG PO TABS: 80.0000 mg | ORAL_TABLET | Freq: Every day | ORAL | 3 refills | Status: DC

## 2019-12-28 ENCOUNTER — Ambulatory Visit: Payer: PPO | Admitting: Podiatry

## 2019-12-28 ENCOUNTER — Other Ambulatory Visit: Payer: Self-pay

## 2019-12-28 DIAGNOSIS — Z01818 Encounter for other preprocedural examination: Secondary | ICD-10-CM | POA: Diagnosis not present

## 2019-12-28 DIAGNOSIS — M67471 Ganglion, right ankle and foot: Secondary | ICD-10-CM | POA: Diagnosis not present

## 2019-12-28 DIAGNOSIS — M79671 Pain in right foot: Secondary | ICD-10-CM

## 2020-01-01 ENCOUNTER — Encounter: Payer: Self-pay | Admitting: Podiatry

## 2020-01-01 NOTE — Progress Notes (Signed)
Subjective:  Patient ID: Roger Alvarado, male    DOB: 04/05/1951,  MRN: [id number]  No chief complaint on file.   69 y.o. male presents with the above complaint.  Patient presents with a follow-up of right foot ganglion cyst multilobulated.  Patient states it came back again.  He had an aspiration done by me which lasted fourth about 6 to 8 months however he just came back.  He would like to discuss surgical excision at this time.  He denies any other acute complaints.  He has failed all conservative therapy including shoe gear modification offloading the lesion.  At this time he would like to have the soft tissue mass removed.   Review of Systems: Negative except as noted in the HPI. Denies N/V/F/Ch.  Past Medical History:  Diagnosis Date  . Allergy   . Arthritis   . Asthma    Childhood  . Diabetes mellitus   . Diverticulitis   . Diverticulosis   . Gout   . Gout   . Hyperlipidemia   . Obesity, Class III, BMI 40-49.9 (morbid obesity) (HCC)     Current Outpatient Medications:  .  aspirin 81 MG tablet, Take 81 mg by mouth daily., Disp: , Rfl:  .  colchicine 0.6 MG tablet, TAKE 2 TABLETS BY MOUTH AT ONSET OF FLARE AND THEN 1 TABLET 1 HOUR LATER, Disp: 180 tablet, Rfl: 1 .  lisinopril (ZESTRIL) 10 MG tablet, TAKE 1 TABLET BY MOUTH EVERY DAY, Disp: 90 tablet, Rfl: 3 .  metFORMIN (GLUCOPHAGE) 1000 MG tablet, TAKE 1 TABLET BY MOUTH TWICE A DAY, Disp: 180 tablet, Rfl: 3 .  pravastatin (PRAVACHOL) 80 MG tablet, Take 1 tablet (80 mg total) by mouth daily., Disp: 90 tablet, Rfl: 3 .  vitamin B-12 (CYANOCOBALAMIN) 1000 MCG tablet, Take 1 tablet (1,000 mcg total) by mouth daily., Disp: 90 tablet, Rfl: 3 .  Vitamin D, Ergocalciferol, (DRISDOL) 1.25 MG (50000 UNIT) CAPS capsule, Take 1 capsule (50,000 Units total) by mouth every 7 (seven) days., Disp: 12 capsule, Rfl: 0  Social History   Tobacco Use  Smoking Status Never Smoker  Smokeless Tobacco Never Used    No Known  Allergies Objective:   There were no vitals filed for this visit. There is no height or weight on file to calculate BMI. Constitutional Well developed. Well nourished.  Vascular Dorsalis pedis pulses palpable bilaterally. Posterior tibial pulses palpable bilaterally. Capillary refill normal to all digits.  No cyanosis or clubbing noted. Pedal hair growth normal.  Neurologic Normal speech. Oriented to person, place, and time. Epicritic sensation to light touch grossly present bilaterally.  Dermatologic  multiple ganglion cysts noted x3.  They are lobulated easily palpable.  Positive transillumination.  Not indurated.  Pain on palpation to the cyst.  Orthopedic: Normal joint ROM without pain or crepitus bilaterally. No visible deformities. No bony tenderness.   Radiographs: None Assessment:   1. Ganglion cyst of right foot   2. Foot pain, right    Plan:  Patient was evaluated and treated and all questions answered.  Right foot dorsal ganglion cyst x3 (dorsal medial foot, dorsal lateral foot, dorsal proximal foot, -It appears that it has recurred.  At this time I discussed with the patient that ganglion cyst tends to recur especially without fully excision.  At this time patient does not want any further aspiration of the mass at this time.  He would like to have the soft tissue mass/tumor completely excised out.  I discussed with the  patient my postop protocol including incision placement.  The patient states understanding he will be no weightbearing as tolerated the boot.  Patient already has a boot from the past and will bring it with him to the surgery center.  I plan on performing excision of the right dorsal foot ganglion cyst multilobulated -I discussed my postop protocol in extensive detail including weightbearing as tolerated in the shoe followed by transition to regular sneaker -Informed surgical risk consent was reviewed and read aloud to the patient.  I reviewed the films.  I  have discussed my findings with the patient in great detail.  I have discussed all risks including but not limited to infection, stiffness, scarring, limp, disability, deformity, damage to blood vessels and nerves, numbness, poor healing, need for braces, arthritis, chronic pain, amputation, death.  All benefits and realistic expectations discussed in great detail.  I have made no promises as to the outcome.  I have provided realistic expectations.  I have offered the patient a 2nd opinion, which they have declined and assured me they preferred to proceed despite the risks -A total of 33 minutes was spent in direct patient care as well as pre and post patient encounter activities.  This includes documentation as well as reviewing patient chart for labs, imaging, past medical, surgical, social, and family history as documented in the EMR.  I have reviewed medication allergies as documented in EMR.  I discussed the etiology of condition and treatment options from conservative to surgical care.  All risks and benefit of the treatment course was discussed in detail.  All questions were answered and return appointment was discussed.  Since the visit completed in an ambulatory/outpatient setting, the patient and/or parent/guardian has been advised to contact the providers office for worsening condition and seek medical treatment and/or call 911 if the patient deems either is necessary.     No follow-ups on file.

## 2020-01-03 ENCOUNTER — Telehealth: Payer: Self-pay

## 2020-01-03 NOTE — Telephone Encounter (Signed)
DOS 01/21/2020  EXC GANGLION CYST/TUMOR RT - 28090  [facility] [place] [facility]  AUTH # [id number] GOOD FROM 01/21/2020 - 04/20/2020

## 2020-01-21 DIAGNOSIS — M67471 Ganglion, right ankle and foot: Secondary | ICD-10-CM | POA: Diagnosis not present

## 2020-01-21 DIAGNOSIS — D492 Neoplasm of unspecified behavior of bone, soft tissue, and skin: Secondary | ICD-10-CM | POA: Diagnosis not present

## 2020-01-21 MED ORDER — IBUPROFEN 800 MG PO TABS: 800.0000 mg | ORAL_TABLET | Freq: Four times a day (QID) | ORAL | 1 refills | Status: DC | PRN

## 2020-01-21 MED ORDER — OXYCODONE-ACETAMINOPHEN 10-325 MG PO TABS: 1.0000 | ORAL_TABLET | Freq: Four times a day (QID) | ORAL | 0 refills | Status: AC | PRN

## 2020-01-21 NOTE — Addendum Note (Signed)
Addended by: Boneta Lucks on: 01/21/2020 07:18 AM   Modules accepted: Orders

## 2020-01-24 ENCOUNTER — Telehealth: Payer: Self-pay

## 2020-01-24 NOTE — Telephone Encounter (Signed)
He can take it off

## 2020-01-24 NOTE — Telephone Encounter (Signed)
Patient called to ask if he can take his boot off at night or does he need to sleep in it? Please advise thanks

## 2020-01-24 NOTE — Telephone Encounter (Signed)
Left message advising OK to take boot off at bedtime. Advised to please call back with any further questions or problems

## 2020-01-30 ENCOUNTER — Ambulatory Visit (INDEPENDENT_AMBULATORY_CARE_PROVIDER_SITE_OTHER): Payer: PPO | Admitting: Podiatry

## 2020-01-30 ENCOUNTER — Encounter: Payer: Self-pay | Admitting: Podiatry

## 2020-01-30 ENCOUNTER — Other Ambulatory Visit: Payer: Self-pay

## 2020-01-30 DIAGNOSIS — M67471 Ganglion, right ankle and foot: Secondary | ICD-10-CM

## 2020-01-30 DIAGNOSIS — Z9889 Other specified postprocedural states: Secondary | ICD-10-CM

## 2020-01-31 ENCOUNTER — Encounter: Payer: Self-pay | Admitting: Podiatry

## 2020-01-31 NOTE — Progress Notes (Signed)
  Subjective:  Patient ID: Roger Alvarado, male    DOB: 1950-04-22,  MRN: [id number]  Chief Complaint  Patient presents with  . Routine Post Op    POV #1 DOS 01/21/2020 RT FOOT REMOVAL/EXCISION OF SOFT TISSUE MASS/TUMOR     69 y.o. male returns for post-op check.  Patient states is doing well.  Occasional numbness and tingling overall he is very happy with the procedure.  The skin is intact.  His dressings clean dry and intact.  He has been weightbearing as tolerated with a cam boot.  He denies any other acute complaints.  Review of Systems: Negative except as noted in the HPI. Denies N/V/F/Ch.  Past Medical History:  Diagnosis Date  . Allergy   . Arthritis   . Asthma    Childhood  . Diabetes mellitus   . Diverticulitis   . Diverticulosis   . Gout   . Gout   . Hyperlipidemia   . Obesity, Class III, BMI 40-49.9 (morbid obesity) (HCC)     Current Outpatient Medications:  .  aspirin 81 MG tablet, Take 81 mg by mouth daily., Disp: , Rfl:  .  colchicine 0.6 MG tablet, TAKE 2 TABLETS BY MOUTH AT ONSET OF FLARE AND THEN 1 TABLET 1 HOUR LATER, Disp: 180 tablet, Rfl: 1 .  ibuprofen (ADVIL) 800 MG tablet, Take 1 tablet (800 mg total) by mouth every 6 (six) hours as needed., Disp: 60 tablet, Rfl: 1 .  lisinopril (ZESTRIL) 10 MG tablet, TAKE 1 TABLET BY MOUTH EVERY DAY, Disp: 90 tablet, Rfl: 3 .  metFORMIN (GLUCOPHAGE) 1000 MG tablet, TAKE 1 TABLET BY MOUTH TWICE A DAY, Disp: 180 tablet, Rfl: 3 .  pravastatin (PRAVACHOL) 80 MG tablet, Take 1 tablet (80 mg total) by mouth daily., Disp: 90 tablet, Rfl: 3 .  vitamin B-12 (CYANOCOBALAMIN) 1000 MCG tablet, Take 1 tablet (1,000 mcg total) by mouth daily., Disp: 90 tablet, Rfl: 3 .  Vitamin D, Ergocalciferol, (DRISDOL) 1.25 MG (50000 UNIT) CAPS capsule, Take 1 capsule (50,000 Units total) by mouth every 7 (seven) days., Disp: 12 capsule, Rfl: 0  Social History   Tobacco Use  Smoking Status Never Smoker  Smokeless Tobacco Never Used    No  Known Allergies Objective:  There were no vitals filed for this visit. There is no height or weight on file to calculate BMI. Constitutional Well developed. Well nourished.  Vascular Foot warm and well perfused. Capillary refill normal to all digits.   Neurologic Normal speech. Oriented to person, place, and time. Epicritic sensation to light touch grossly present bilaterally.  Dermatologic Skin healing well without signs of infection. Skin edges well coapted without signs of infection.  Orthopedic: Tenderness to palpation noted about the surgical site.   Radiographs: None Assessment:   1. Ganglion cyst of right foot   2. Status post foot surgery    Plan:  Patient was evaluated and treated and all questions answered.  S/p foot surgery right -Progressing as expected post-operatively. -XR: None -WB Status: Weightbearing as tolerated in cam boot -Sutures/staples: Intact.  No signs of dehiscence noted.  No complication noted. -Medications: None -Foot redressed.  No follow-ups on file.

## 2020-02-06 ENCOUNTER — Encounter: Payer: PPO | Admitting: Podiatry

## 2020-02-15 ENCOUNTER — Ambulatory Visit (INDEPENDENT_AMBULATORY_CARE_PROVIDER_SITE_OTHER): Payer: PPO | Admitting: Podiatry

## 2020-02-15 ENCOUNTER — Other Ambulatory Visit: Payer: Self-pay

## 2020-02-15 DIAGNOSIS — Z9889 Other specified postprocedural states: Secondary | ICD-10-CM

## 2020-02-15 DIAGNOSIS — M67471 Ganglion, right ankle and foot: Secondary | ICD-10-CM

## 2020-02-18 ENCOUNTER — Telehealth: Payer: Self-pay

## 2020-02-18 NOTE — Telephone Encounter (Signed)
Pt wants to know if he needed to be seen on Wednesday. Pt was told that he didn't need to be seen anymore after his last visit. Please advise

## 2020-02-18 NOTE — Telephone Encounter (Signed)
No he does not need to be seen anymore.  He is officially discharged from my care

## 2020-02-19 ENCOUNTER — Encounter: Payer: Self-pay | Admitting: Podiatry

## 2020-02-19 NOTE — Progress Notes (Signed)
  Subjective:  Patient ID: Roger Alvarado, male    DOB: Jul 30, 1950,  MRN: [id number]  Chief Complaint  Patient presents with  . Routine Post Op    PT stated that he is doing okay. Has a little numbness but denies any pain at this time      69 y.o. male returns for post-op check.  Patient states is doing well.  Occasional numbness and tingling overall he is very happy with the procedure.  Sutures are intact.  He is ready to have them taken out.  He denies any other acute complaints.  He is also ready to get out of the cam boot.  Review of Systems: Negative except as noted in the HPI. Denies N/V/F/Ch.  Past Medical History:  Diagnosis Date  . Allergy   . Arthritis   . Asthma    Childhood  . Diabetes mellitus   . Diverticulitis   . Diverticulosis   . Gout   . Gout   . Hyperlipidemia   . Obesity, Class III, BMI 40-49.9 (morbid obesity) (HCC)     Current Outpatient Medications:  .  aspirin 81 MG tablet, Take 81 mg by mouth daily., Disp: , Rfl:  .  colchicine 0.6 MG tablet, TAKE 2 TABLETS BY MOUTH AT ONSET OF FLARE AND THEN 1 TABLET 1 HOUR LATER, Disp: 180 tablet, Rfl: 1 .  FLUZONE HIGH-DOSE QUADRIVALENT 0.7 ML SUSY, , Disp: , Rfl:  .  ibuprofen (ADVIL) 800 MG tablet, Take 1 tablet (800 mg total) by mouth every 6 (six) hours as needed., Disp: 60 tablet, Rfl: 1 .  lisinopril (ZESTRIL) 10 MG tablet, TAKE 1 TABLET BY MOUTH EVERY DAY, Disp: 90 tablet, Rfl: 3 .  metFORMIN (GLUCOPHAGE) 1000 MG tablet, TAKE 1 TABLET BY MOUTH TWICE A DAY, Disp: 180 tablet, Rfl: 3 .  pravastatin (PRAVACHOL) 80 MG tablet, Take 1 tablet (80 mg total) by mouth daily., Disp: 90 tablet, Rfl: 3 .  vitamin B-12 (CYANOCOBALAMIN) 1000 MCG tablet, Take 1 tablet (1,000 mcg total) by mouth daily., Disp: 90 tablet, Rfl: 3 .  Vitamin D, Ergocalciferol, (DRISDOL) 1.25 MG (50000 UNIT) CAPS capsule, Take 1 capsule (50,000 Units total) by mouth every 7 (seven) days., Disp: 12 capsule, Rfl: 0  Social History   Tobacco Use    Smoking Status Never Smoker  Smokeless Tobacco Never Used    No Known Allergies Objective:  There were no vitals filed for this visit. There is no height or weight on file to calculate BMI. Constitutional Well developed. Well nourished.  Vascular Foot warm and well perfused. Capillary refill normal to all digits.   Neurologic Normal speech. Oriented to person, place, and time. Epicritic sensation to light touch grossly present bilaterally.  Dermatologic  skin completely reepithelialized.  No signs of dehiscence noted.  No signs of reoccurrence of ganglion cyst noted at this time.  Orthopedic: Tenderness to palpation noted about the surgical site.   Radiographs: None Assessment:   1. Ganglion cyst of right foot   2. Status post foot surgery    Plan:  Patient was evaluated and treated and all questions answered.  S/p foot surgery right -Progressing as expected post-operatively. -XR: None -WB Status: Weightbearing as tolerated in cam boot -Sutures/staples: None -Medications: None -Patient is officially discharged from my care.  If any foot and ankle issues arise in the future I have asked him to come back and see me.  He states understanding.  No follow-ups on file.

## 2020-02-20 ENCOUNTER — Encounter: Payer: PPO | Admitting: Podiatry

## 2020-04-04 IMAGING — CT CT ABD-PELV W/ CM
2 of 5 series · 13 of 46 positions shown, 15 images · IV contrast (iopamidol)
Comparison: 10/12/2011 CT abdomen/pelvis.

CLINICAL DATA: Right abdominal pain for 1 week, worse postprandial.
History of diverticulosis.

EXAM:
CT ABDOMEN AND PELVIS WITH CONTRAST
TECHNIQUE: Multidetector CT imaging of the abdomen and pelvis was performed
using the standard protocol following bolus administration of
intravenous contrast.
CONTRAST:  125mL B4AI8M-BRR IOPAMIDOL (B4AI8M-BRR) INJECTION 61%

[Series 2: abd pelvis 5.00 br40 s3 axial · axial · 0.78mm/px · z∈[+1218,+1623]mm · 10 of 95 slices shown, 12 images]
[im 9/95  soft-tissue]
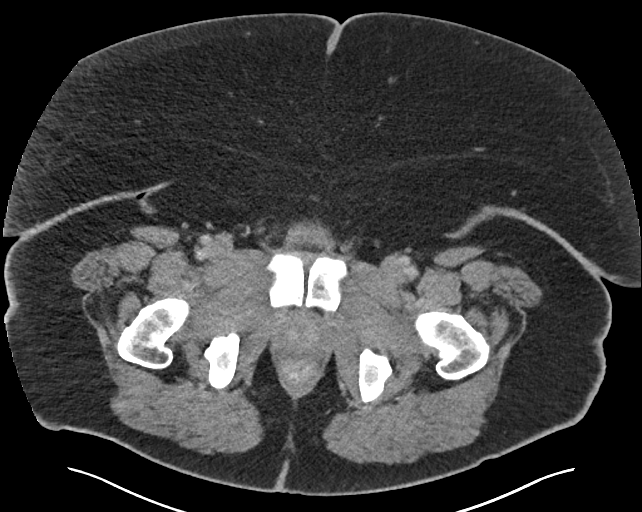
[im 9/95  bone]
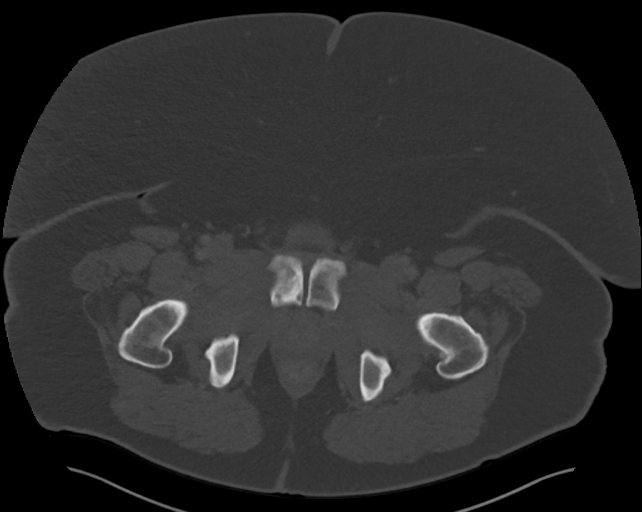
[im 18/95  soft-tissue]
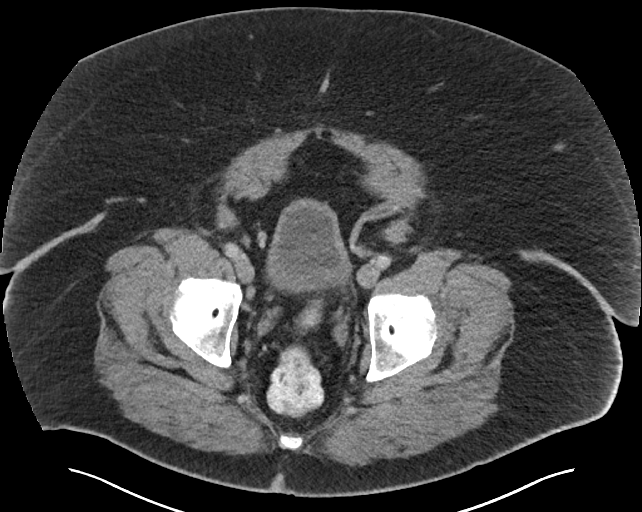
[im 27/95  soft-tissue]
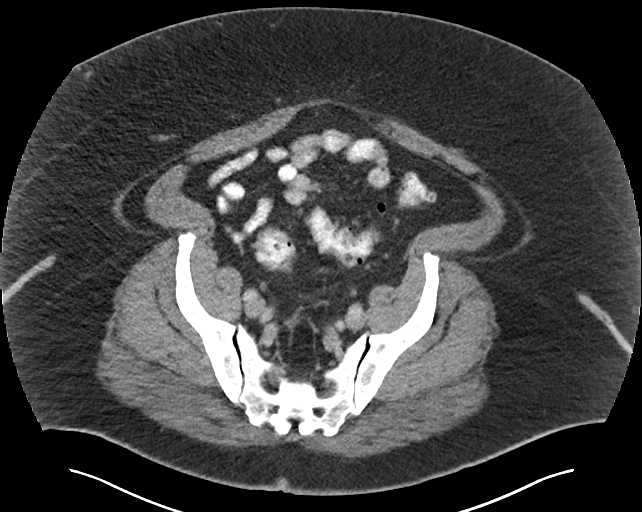
[im 36/95  soft-tissue]
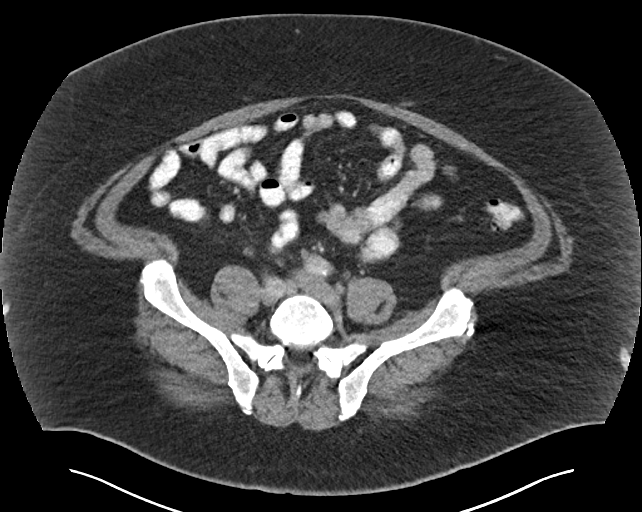
[im 45/95  soft-tissue]
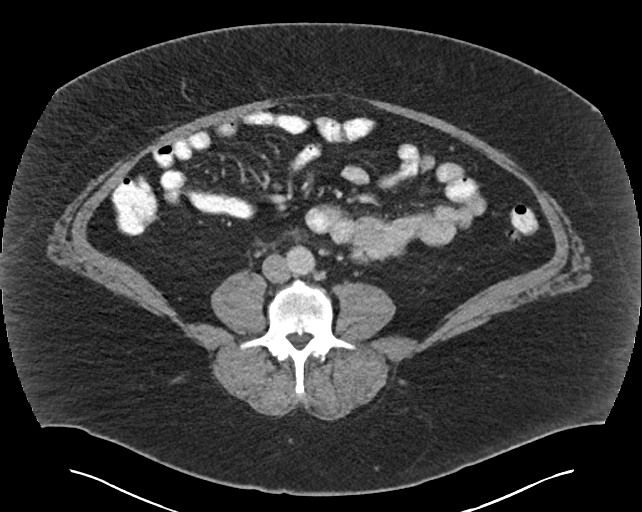
[im 54/95  soft-tissue]
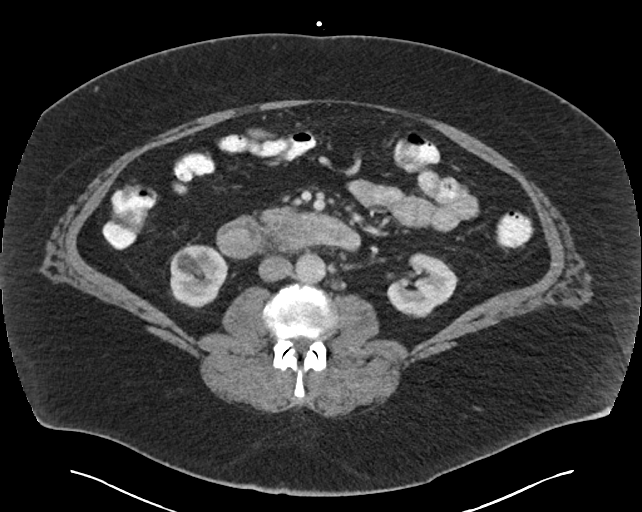
[im 63/95  soft-tissue]
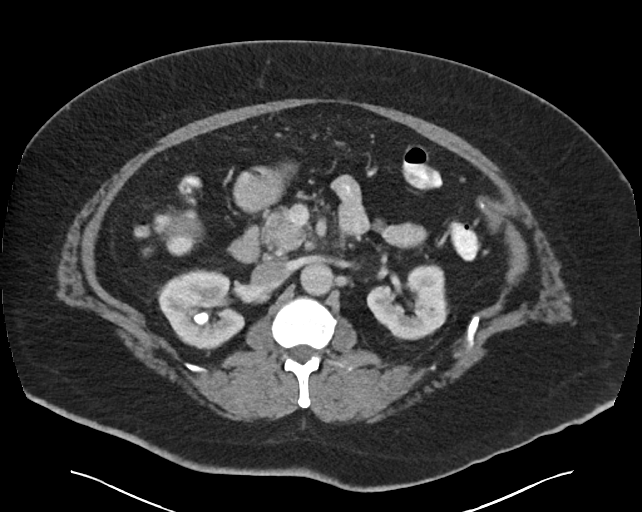
[im 72/95  soft-tissue]
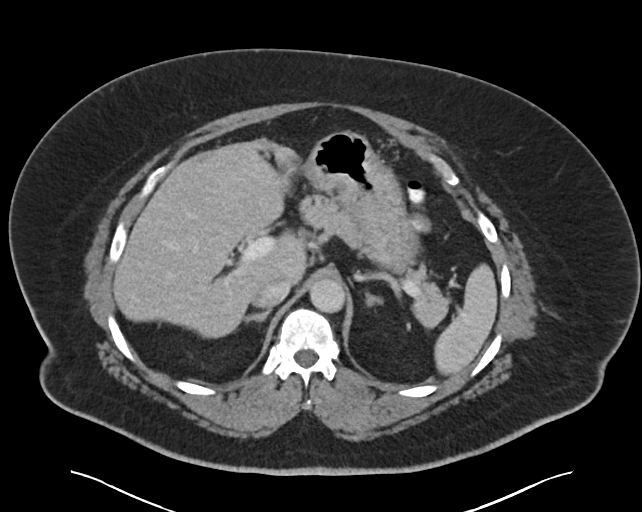
[im 81/95  soft-tissue]
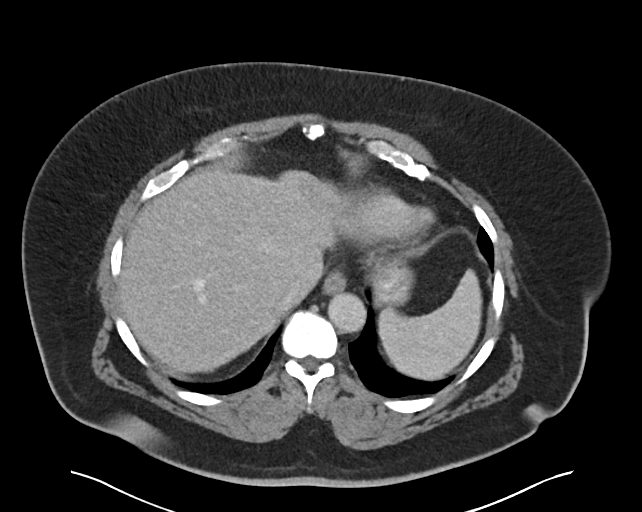
[im 81/95  bone]
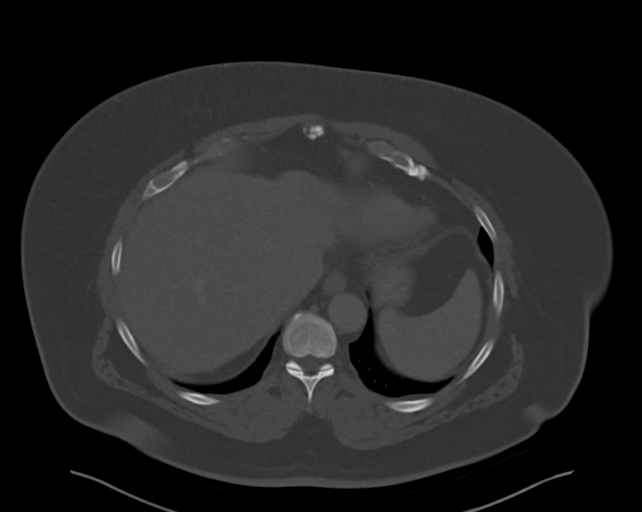
[im 90/95  soft-tissue]
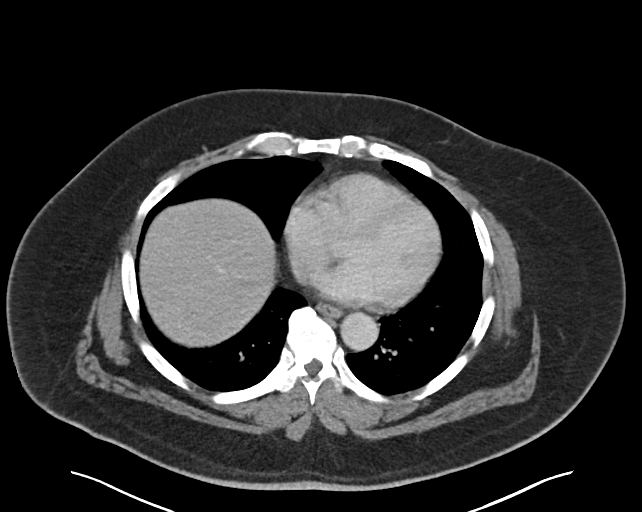

[Series 6: abd pelvis 2.00 br40 s3 cor · coronal · 0.93mm/px · 3 of 191 slices shown]
[im 64/191  soft-tissue]
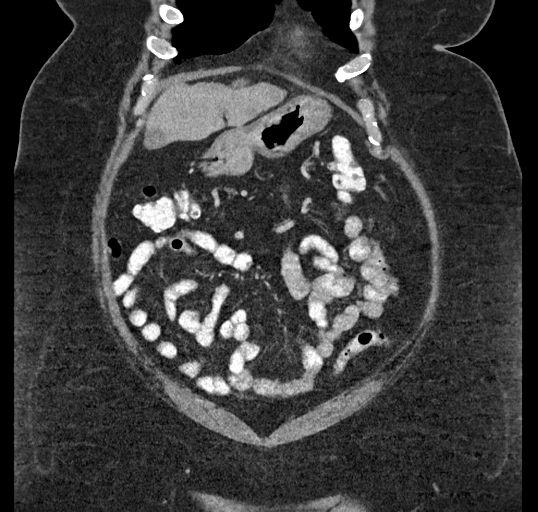
[im 85/191  soft-tissue]
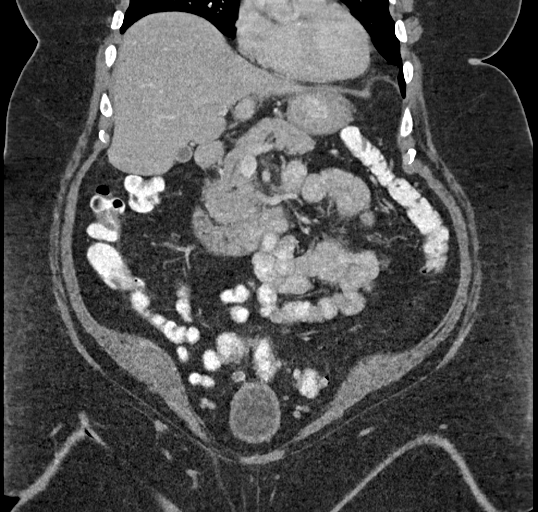
[im 106/191  soft-tissue]
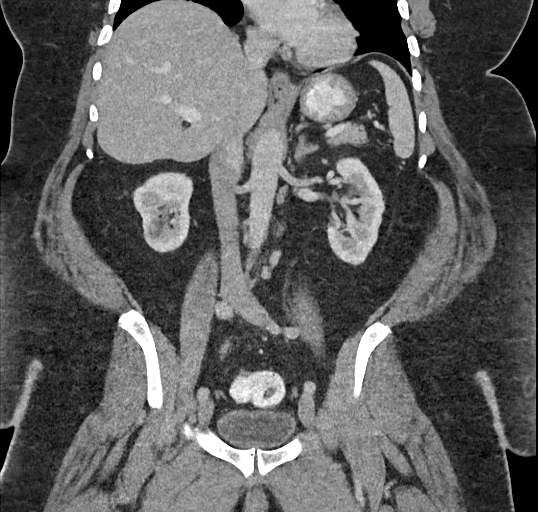

[13 of 46 positions shown; findings below may reference images not displayed]

FINDINGS: Lower chest: No significant pulmonary nodules or acute consolidative
airspace disease.

Hepatobiliary: Normal liver size, nonspecific lobulated liver
contour is unchanged since 0464 CT. Vague subcapsular regions of
slight hypoenhancement in the anterior liver are less apparent on
the delayed sequence, most compatible with transient perfusional
phenomena. No liver masses. Normal gallbladder with no radiopaque
cholelithiasis. No biliary ductal dilatation.

Pancreas: Mild fullness of the pancreatic head with slight
peripancreatic fat stranding, cannot exclude acute pancreatitis. No
discrete pancreatic mass. No pancreatic duct dilation.

Spleen: Normal size. No mass.

Adrenals/Urinary Tract: Normal adrenals. Nonobstructing 10 mm upper
right renal stone. No hydronephrosis. Simple 2.0 cm lower right
renal cyst. Several subcentimeter hypodense renal cortical lesions
in both kidneys are too small to characterize and require no
follow-up. Normal bladder.

Stomach/Bowel: Normal non-distended stomach. Normal caliber small
bowel with no small bowel wall thickening. Normal appendix. Mild
diffuse colonic diverticulosis, most prominent in the sigmoid colon,
with no large bowel wall thickening or significant pericolonic fat
stranding. Oral contrast transits to the rectum.

Vascular/Lymphatic: Mildly atherosclerotic nonaneurysmal abdominal
aorta. Patent portal, splenic, hepatic and renal veins. No
pathologically enlarged lymph nodes in the abdomen or pelvis.

Reproductive: Normal size prostate.

Other: No pneumoperitoneum, ascites or focal fluid collection.

Musculoskeletal: No aggressive appearing focal osseous lesions. Mild
thoracolumbar spondylosis.
IMPRESSION: 1. Subtle findings in the pancreatic head that may indicate acute
pancreatitis. Suggest correlation with serum lipase. No discrete
pancreatic mass. No peripancreatic fluid collections. No biliary or
pancreatic duct dilation.
2. Mild diffuse colonic diverticulosis, with no evidence of acute
diverticulitis.
3. Nonobstructing right nephrolithiasis.
4.  Aortic Atherosclerosis ([id number].L).

These results will be called to the ordering clinician or
representative by the [HOSPITAL] at the imaging location.

## 2020-05-01 ENCOUNTER — Telehealth: Payer: Self-pay | Admitting: Internal Medicine

## 2020-05-01 NOTE — Telephone Encounter (Signed)
LVM for pt to rtn my call to schedule AWV with [facility]. Please schedule this appt if pt calls the office.  °

## 2020-05-21 ENCOUNTER — Ambulatory Visit (INDEPENDENT_AMBULATORY_CARE_PROVIDER_SITE_OTHER): Payer: PPO

## 2020-05-21 ENCOUNTER — Other Ambulatory Visit: Payer: Self-pay

## 2020-05-21 VITALS — BP 136/70 | HR 80 | Temp 98.0°F | Ht 69.0 in | Wt 306.0 lb

## 2020-05-21 DIAGNOSIS — Z0000 Encounter for general adult medical examination without abnormal findings: Secondary | ICD-10-CM

## 2020-05-21 NOTE — Patient Instructions (Addendum)
Roger Alvarado , Thank you for taking time to come for your Medicare Wellness Visit. I appreciate your ongoing commitment to your health goals. Please review the following plan we discussed and let me know if I can assist you in the future.   Screening recommendations/referrals: Colonoscopy: 06/02/2016; due every 10 years Recommended yearly ophthalmology/optometry visit for glaucoma screening and checkup Recommended yearly dental visit for hygiene and checkup  Vaccinations: Influenza vaccine: 01/17/2020 Pneumococcal vaccine: 08/28/2015, 09/03/2016 Tdap vaccine: 08/28/2015; due every 10 years Shingles vaccine: never done   Covid-19: 04/23/2019, 05/18/2019, 01/17/2020  Advanced directives: Advance directive discussed with you today. Even though you declined this today please call our office should you change your mind and we can give you the proper paperwork for you to fill out.  Conditions/risks identified: Yes; Reviewed health maintenance screenings with patient today and relevant education, vaccines, and/or referrals were provided. Please continue to do your personal lifestyle choices by: daily care of teeth and gums, regular physical activity (goal should be 5 days a week for 30 minutes), eat a healthy diet, avoid tobacco and drug use, limiting any alcohol intake, taking a low-dose aspirin (if not allergic or have been advised by your provider otherwise) and taking vitamins and minerals as recommended by your provider. Continue doing brain stimulating activities (puzzles, reading, adult coloring books, staying active) to keep memory sharp. Continue to eat heart healthy diet (full of fruits, vegetables, whole grains, lean protein, water--limit salt, fat, and sugar intake) and increase physical activity as tolerated.  Next appointment: Please schedule your next Medicare Wellness Visit with your Nurse Health Advisor in 1 year by calling [phone].   Preventive Care 5 Years and Older, Male Preventive  care refers to lifestyle choices and visits with your health care provider that can promote health and wellness. What does preventive care include?  A yearly physical exam. This is also called an annual well check.  Dental exams once or twice a year.  Routine eye exams. Ask your health care provider how often you should have your eyes checked.  Personal lifestyle choices, including:  Daily care of your teeth and gums.  Regular physical activity.  Eating a healthy diet.  Avoiding tobacco and drug use.  Limiting alcohol use.  Practicing safe sex.  Taking low doses of aspirin every day.  Taking vitamin and mineral supplements as recommended by your health care provider. What happens during an annual well check? The services and screenings done by your health care provider during your annual well check will depend on your age, overall health, lifestyle risk factors, and family history of disease. Counseling  Your health care provider may ask you questions about your:  Alcohol use.  Tobacco use.  Drug use.  Emotional well-being.  Home and relationship well-being.  Sexual activity.  Eating habits.  History of falls.  Memory and ability to understand (cognition).  Work and work Statistician. Screening  You may have the following tests or measurements:  Height, weight, and BMI.  Blood pressure.  Lipid and cholesterol levels. These may be checked every 5 years, or more frequently if you are over 38 years old.  Skin check.  Lung cancer screening. You may have this screening every year starting at age 76 if you have a 30-pack-year history of smoking and currently smoke or have quit within the past 15 years.  Fecal occult blood test (FOBT) of the stool. You may have this test every year starting at age 9.  Flexible sigmoidoscopy or colonoscopy.  You may have a sigmoidoscopy every 5 years or a colonoscopy every 10 years starting at age 69.  Prostate cancer  screening. Recommendations will vary depending on your family history and other risks.  Hepatitis C blood test.  Hepatitis B blood test.  Sexually transmitted disease (STD) testing.  Diabetes screening. This is done by checking your blood sugar (glucose) after you have not eaten for a while (fasting). You may have this done every 1-3 years.  Abdominal aortic aneurysm (AAA) screening. You may need this if you are a current or former smoker.  Osteoporosis. You may be screened starting at age 14 if you are at high risk. Talk with your health care provider about your test results, treatment options, and if necessary, the need for more tests. Vaccines  Your health care provider may recommend certain vaccines, such as:  Influenza vaccine. This is recommended every year.  Tetanus, diphtheria, and acellular pertussis (Tdap, Td) vaccine. You may need a Td booster every 10 years.  Zoster vaccine. You may need this after age 28.  Pneumococcal 13-valent conjugate (PCV13) vaccine. One dose is recommended after age 70.  Pneumococcal polysaccharide (PPSV23) vaccine. One dose is recommended after age 44. Talk to your health care provider about which screenings and vaccines you need and how often you need them. This information is not intended to replace advice given to you by your health care provider. Make sure you discuss any questions you have with your health care provider. Document Released: 04/18/2015 Document Revised: 12/10/2015 Document Reviewed: 01/21/2015 Elsevier Interactive Patient Education  2017 [employer] Prevention in the Home Falls can cause injuries. They can happen to people of all ages. There are many things you can do to make your home safe and to help prevent falls. What can I do on the outside of my home?  Regularly fix the edges of walkways and driveways and fix any cracks.  Remove anything that might make you trip as you walk through a door, such as a raised  step or threshold.  Trim any bushes or trees on the path to your home.  Use bright outdoor lighting.  Clear any walking paths of anything that might make someone trip, such as rocks or tools.  Regularly check to see if handrails are loose or broken. Make sure that both sides of any steps have handrails.  Any raised decks and porches should have guardrails on the edges.  Have any leaves, snow, or ice cleared regularly.  Use sand or salt on walking paths during winter.  Clean up any spills in your garage right away. This includes oil or grease spills. What can I do in the bathroom?  Use night lights.  Install grab bars by the toilet and in the tub and shower. Do not use towel bars as grab bars.  Use non-skid mats or decals in the tub or shower.  If you need to sit down in the shower, use a plastic, non-slip stool.  Keep the floor dry. Clean up any water that spills on the floor as soon as it happens.  Remove soap buildup in the tub or shower regularly.  Attach bath mats securely with double-sided non-slip rug tape.  Do not have throw rugs and other things on the floor that can make you trip. What can I do in the bedroom?  Use night lights.  Make sure that you have a light by your bed that is easy to reach.  Do not use any sheets  or blankets that are too big for your bed. They should not hang down onto the floor.  Have a firm chair that has side arms. You can use this for support while you get dressed.  Do not have throw rugs and other things on the floor that can make you trip. What can I do in the kitchen?  Clean up any spills right away.  Avoid walking on wet floors.  Keep items that you use a lot in easy-to-reach places.  If you need to reach something above you, use a strong step stool that has a grab bar.  Keep electrical cords out of the way.  Do not use floor polish or wax that makes floors slippery. If you must use wax, use non-skid floor wax.  Do not  have throw rugs and other things on the floor that can make you trip. What can I do with my stairs?  Do not leave any items on the stairs.  Make sure that there are handrails on both sides of the stairs and use them. Fix handrails that are broken or loose. Make sure that handrails are as long as the stairways.  Check any carpeting to make sure that it is firmly attached to the stairs. Fix any carpet that is loose or worn.  Avoid having throw rugs at the top or bottom of the stairs. If you do have throw rugs, attach them to the floor with carpet tape.  Make sure that you have a light switch at the top of the stairs and the bottom of the stairs. If you do not have them, ask someone to add them for you. What else can I do to help prevent falls?  Wear shoes that:  Do not have high heels.  Have rubber bottoms.  Are comfortable and fit you well.  Are closed at the toe. Do not wear sandals.  If you use a stepladder:  Make sure that it is fully opened. Do not climb a closed stepladder.  Make sure that both sides of the stepladder are locked into place.  Ask someone to hold it for you, if possible.  Clearly mark and make sure that you can see:  Any grab bars or handrails.  First and last steps.  Where the edge of each step is.  Use tools that help you move around (mobility aids) if they are needed. These include:  Canes.  Walkers.  Scooters.  Crutches.  Turn on the lights when you go into a dark area. Replace any light bulbs as soon as they burn out.  Set up your furniture so you have a clear path. Avoid moving your furniture around.  If any of your floors are uneven, fix them.  If there are any pets around you, be aware of where they are.  Review your medicines with your doctor. Some medicines can make you feel dizzy. This can increase your chance of falling. Ask your doctor what other things that you can do to help prevent falls. This information is not intended  to replace advice given to you by your health care provider. Make sure you discuss any questions you have with your health care provider. Document Released: 01/16/2009 Document Revised: 08/28/2015 Document Reviewed: 04/26/2014 Elsevier Interactive Patient Education  2017 [employer].

## 2020-05-21 NOTE — Progress Notes (Signed)
Subjective:   Roger Alvarado is a 70 y.o. male who presents for Medicare Annual/Subsequent preventive examination.  Review of Systems    No ROS. Medicare Wellness Visit. Additional risk factors are reflected in social history. Cardiac Risk Factors include: advanced age (>67men, >17 women);diabetes mellitus;dyslipidemia;family history of premature cardiovascular disease;hypertension;male gender;obesity (BMI >30kg/m2)     Objective:    Today's Vitals   05/21/20 1310  BP: 136/70  Pulse: 80  Temp: 98 F (36.7 C)  SpO2: 97%  Weight: (!) 306 lb (138.8 kg)  Height: 5\' 9"  (1.753 m)  PainSc: 0-No pain   Body mass index is 45.19 kg/m.  Advanced Directives 05/21/2020 03/24/2017 08/20/2016  Does Patient Have a Medical Advance Directive? No No No  Would patient like information on creating a medical advance directive? No - Patient declined No - Patient declined -    Current Medications (verified) Outpatient Encounter Medications as of 05/21/2020  Medication Sig  . aspirin 81 MG tablet Take 81 mg by mouth daily.  . colchicine 0.6 MG tablet TAKE 2 TABLETS BY MOUTH AT ONSET OF FLARE AND THEN 1 TABLET 1 HOUR LATER  . FLUZONE HIGH-DOSE QUADRIVALENT 0.7 ML SUSY   . ibuprofen (ADVIL) 800 MG tablet Take 1 tablet (800 mg total) by mouth every 6 (six) hours as needed.  Marland Kitchen lisinopril (ZESTRIL) 10 MG tablet TAKE 1 TABLET BY MOUTH EVERY DAY  . metFORMIN (GLUCOPHAGE) 1000 MG tablet TAKE 1 TABLET BY MOUTH TWICE A DAY  . pravastatin (PRAVACHOL) 80 MG tablet Take 1 tablet (80 mg total) by mouth daily.  . vitamin B-12 (CYANOCOBALAMIN) 1000 MCG tablet Take 1 tablet (1,000 mcg total) by mouth daily. (Patient not taking: Reported on 05/21/2020)  . Vitamin D, Ergocalciferol, (DRISDOL) 1.25 MG (50000 UNIT) CAPS capsule Take 1 capsule (50,000 Units total) by mouth every 7 (seven) days. (Patient not taking: Reported on 05/21/2020)   No facility-administered encounter medications on file as of 05/21/2020.     Allergies (verified) Patient has no known allergies.   History: Past Medical History:  Diagnosis Date  . Allergy   . Arthritis   . Asthma    Childhood  . Diabetes mellitus   . Diverticulitis   . Diverticulosis   . Gout   . Gout   . Hyperlipidemia   . Obesity, Class III, BMI 40-49.9 (morbid obesity) ([place])    History reviewed. No pertinent surgical history. Family History  Problem Relation Age of Onset  . Diabetes Brother   . Hypertension Brother   . Diabetes Brother   . Hypertension Brother   . Diabetes Father   . Hypertension Father   . Diabetes Sister   . Asthma Mother   . Heart attack Maternal Grandfather    Social History   Socioeconomic History  . Marital status: Married    Spouse name: Not on file  . Number of children: 2  . Years of education: 17  . Highest education level: Not on file  Occupational History  . Occupation: Retired  Tobacco Use  . Smoking status: Never Smoker  . Smokeless tobacco: Never Used  Vaping Use  . Vaping Use: Never used  Substance and Sexual Activity  . Alcohol use: Yes    Alcohol/week: 2.0 standard drinks    Types: 2 Cans of beer per week  . Drug use: No  . Sexual activity: Not on file  Other Topics Concern  . Not on file  Social History Narrative   Fun: Watch sports,  gardening    Social Determinants of Health   Financial Resource Strain: Low Risk   . Difficulty of Paying Living Expenses: Not hard at all  Food Insecurity: No Food Insecurity  . Worried About Charity fundraiser in the Last Year: Never true  . Ran Out of Food in the Last Year: Never true  Transportation Needs: No Transportation Needs  . Lack of Transportation (Medical): No  . Lack of Transportation (Non-Medical): No  Physical Activity: Inactive  . Days of Exercise per Week: 0 days  . Minutes of Exercise per Session: 0 min  Stress: No Stress Concern Present  . Feeling of Stress : Not at all  Social Connections: Socially Integrated  . Frequency  of Communication with Friends and Family: More than three times a week  . Frequency of Social Gatherings with Friends and Family: Once a week  . Attends Religious Services: 1 to 4 times per year  . Active Member of Clubs or Organizations: Yes  . Attends Archivist Meetings: 1 to 4 times per year  . Marital Status: Married    Tobacco Counseling Counseling given: Not Answered   Clinical Intake:  Pre-visit preparation completed: Yes  Pain : No/denies pain Pain Score: 0-No pain     BMI - recorded: 45.19 Nutritional Status: BMI <19  Underweight Nutritional Risks: None Diabetes: Yes Did pt. bring in CBG monitor from home?: No  How often do you need to have someone help you when you read instructions, pamphlets, or other written materials from your doctor or pharmacy?: 1 - Never What is the last grade level you completed in school?: Master's Degree in Law  Diabetic? yes  Interpreter Needed?: No  Information entered by :: Lisette Abu, LPN   Activities of Daily Living In your present state of health, do you have any difficulty performing the following activities: 05/21/2020  Hearing? N  Vision? N  Difficulty concentrating or making decisions? N  Walking or climbing stairs? N  Dressing or bathing? N  Doing errands, shopping? N  Preparing Food and eating ? N  Using the Toilet? N  In the past six months, have you accidently leaked urine? N  Do you have problems with loss of bowel control? N  Managing your Medications? N  Managing your Finances? N  Housekeeping or managing your Housekeeping? N  Some recent data might be hidden    Patient Care Team: Biagio Borg, MD as PCP - General (Internal Medicine) Himmelrich, Bryson Ha, RD (Inactive) as Dietitian  Indicate any recent Medical Services you may have received from other than [facility] providers in the past year (date may be approximate).     Assessment:   This is a routine wellness examination for  Roger Alvarado.  Hearing/Vision screen No exam data present  Dietary issues and exercise activities discussed: Current Exercise Habits: The patient does not participate in regular exercise at present, Exercise limited by: orthopedic condition(s)  Goals    . Patient Stated     My goal is to get down to my ideal weight of 250 pounds.      Depression Screen PHQ 2/9 Scores 05/21/2020 08/07/2019 08/07/2019 08/24/2018 09/03/2016  PHQ - 2 Score 0 0 0 0 0    Fall Risk Fall Risk  05/21/2020 08/07/2019 08/07/2019 08/24/2018 09/03/2016  Falls in the past year? 0 0 0 0 No  Number falls in past yr: 0 - 0 - -  Injury with Fall? 0 - 0 - -  Risk  for fall due to : No Fall Risks - No Fall Risks - -  Follow up - - Falls evaluation completed - -    FALL RISK PREVENTION PERTAINING TO THE HOME:  Any stairs in or around the home? No  If so, are there any without handrails? No  Home free of loose throw rugs in walkways, pet beds, electrical cords, etc? Yes  Adequate lighting in your home to reduce risk of falls? Yes   ASSISTIVE DEVICES UTILIZED TO PREVENT FALLS:  Life alert? No  Use of a cane, walker or w/c? No  Grab bars in the bathroom? No  Shower chair or bench in shower? No  Elevated toilet seat or a handicapped toilet? No   TIMED UP AND GO:  Was the test performed? No .  Length of time to ambulate 10 feet: 0 sec.   Gait steady and fast without use of assistive device  Cognitive Function:  Normal cognitive status assessed by direct observation by this Nurse Health Advisor. No abnormalities found.         Immunizations Immunization History  Administered Date(s) Administered  . Fluad Quad(high Dose 65+) 12/13/2018  . Influenza, High Dose Seasonal PF 12/29/2016, 01/03/2018  . Influenza-Unspecified 01/17/2020  . PFIZER(Purple Top)SARS-COV-2 Vaccination 04/23/2019, 05/18/2019, 01/17/2020  . Pneumococcal Conjugate-13 08/28/2015  . Pneumococcal Polysaccharide-23 09/03/2016  . Tdap 08/28/2015    TDAP  status: Up to date  Flu Vaccine status: Up to date  Pneumococcal vaccine status: Up to date  Covid-19 vaccine status: Completed vaccines  Qualifies for Shingles Vaccine? Yes   Zostavax completed No   Shingrix Completed?: No.    Education has been provided regarding the importance of this vaccine. Patient has been advised to call insurance company to determine out of pocket expense if they have not yet received this vaccine. Advised may also receive vaccine at local pharmacy or Health Dept. Verbalized acceptance and understanding.  Screening Tests Health Maintenance  Topic Date Due  . HEMOGLOBIN A1C  02/07/2020  . OPHTHALMOLOGY EXAM  02/27/2020  . FOOT EXAM  08/06/2020  . TETANUS/TDAP  08/27/2025  . COLONOSCOPY (Pts 45-72yrs Insurance coverage will need to be confirmed)  06/02/2026  . INFLUENZA VACCINE  Completed  . COVID-19 Vaccine  Completed  . Hepatitis C Screening  Completed  . PNA vac Low Risk Adult  Completed    Health Maintenance  Health Maintenance Due  Topic Date Due  . HEMOGLOBIN A1C  02/07/2020  . OPHTHALMOLOGY EXAM  02/27/2020    Colorectal cancer screening: Type of screening: Colonoscopy. Completed 06/02/2016. Repeat every 10 years  Lung Cancer Screening: (Low Dose CT Chest recommended if Age 73-80 years, 30 pack-year currently smoking OR have quit w/in 15years.) does not qualify.   Lung Cancer Screening Referral: no  Additional Screening:  Hepatitis C Screening: does qualify; Completed yes  Vision Screening: Recommended annual ophthalmology exams for early detection of glaucoma and other disorders of the eye. Is the patient up to date with their annual eye exam?  Yes  Who is the provider or what is the name of the office in which the patient attends annual eye exams? [facility]  If pt is not established with a provider, would they like to be referred to a provider to establish care? No .   Dental Screening: Recommended annual dental exams for  proper oral hygiene  Community Resource Referral / Chronic Care Management: CRR required this visit?  No   CCM required this visit?  No  Plan:     I have personally reviewed and noted the following in the patient's chart:   . Medical and social history . Use of alcohol, tobacco or illicit drugs  . Current medications and supplements . Functional ability and status . Nutritional status . Physical activity . Advanced directives . List of other physicians . Hospitalizations, surgeries, and ER visits in previous 12 months . Vitals . Screenings to include cognitive, depression, and falls . Referrals and appointments  In addition, I have reviewed and discussed with patient certain preventive protocols, quality metrics, and best practice recommendations. A written personalized care plan for preventive services as well as general preventive health recommendations were provided to patient.     Sheral Flow, LPN   8/00/3491   Nurse Notes:  Medications reviewed, patient not on opioids.

## 2020-07-21 ENCOUNTER — Other Ambulatory Visit: Payer: Self-pay | Admitting: Physician Assistant

## 2020-07-21 DIAGNOSIS — F1099 Alcohol use, unspecified with unspecified alcohol-induced disorder: Secondary | ICD-10-CM | POA: Diagnosis not present

## 2020-07-21 DIAGNOSIS — R1011 Right upper quadrant pain: Secondary | ICD-10-CM

## 2020-07-21 DIAGNOSIS — R195 Other fecal abnormalities: Secondary | ICD-10-CM | POA: Diagnosis not present

## 2020-07-21 DIAGNOSIS — R1013 Epigastric pain: Secondary | ICD-10-CM

## 2020-07-21 DIAGNOSIS — Z8719 Personal history of other diseases of the digestive system: Secondary | ICD-10-CM | POA: Diagnosis not present

## 2020-07-24 ENCOUNTER — Ambulatory Visit: Payer: PPO | Admitting: Podiatry

## 2020-07-24 ENCOUNTER — Other Ambulatory Visit: Payer: Self-pay

## 2020-07-24 DIAGNOSIS — M7989 Other specified soft tissue disorders: Secondary | ICD-10-CM

## 2020-07-24 DIAGNOSIS — M67471 Ganglion, right ankle and foot: Secondary | ICD-10-CM | POA: Diagnosis not present

## 2020-07-29 ENCOUNTER — Encounter: Payer: Self-pay | Admitting: Podiatry

## 2020-07-29 DIAGNOSIS — R799 Abnormal finding of blood chemistry, unspecified: Secondary | ICD-10-CM | POA: Diagnosis not present

## 2020-07-29 NOTE — Progress Notes (Signed)
Subjective:  Patient ID: Roger Alvarado, male    DOB: 1950-06-27,  MRN: [id number]  Chief Complaint  Patient presents with  . Cyst    Right foot cyst     70 y.o. male presents with the above complaint.  Patient presents with complaint of new cyst formation to the dorsal foot and right foot.  Patient states is different from the area where he was surgically excised out.  He would like to have this drained.  He does not want to surgically excise it out.  He denies any other acute complaints.   Review of Systems: Negative except as noted in the HPI. Denies N/V/F/Ch.  Past Medical History:  Diagnosis Date  . Allergy   . Arthritis   . Asthma    Childhood  . Diabetes mellitus   . Diverticulitis   . Diverticulosis   . Gout   . Gout   . Hyperlipidemia   . Obesity, Class III, BMI 40-49.9 (morbid obesity) (HCC)     Current Outpatient Medications:  .  aspirin 81 MG tablet, Take 81 mg by mouth daily., Disp: , Rfl:  .  colchicine 0.6 MG tablet, TAKE 2 TABLETS BY MOUTH AT ONSET OF FLARE AND THEN 1 TABLET 1 HOUR LATER, Disp: 180 tablet, Rfl: 1 .  FLUZONE HIGH-DOSE QUADRIVALENT 0.7 ML SUSY, , Disp: , Rfl:  .  ibuprofen (ADVIL) 800 MG tablet, Take 1 tablet (800 mg total) by mouth every 6 (six) hours as needed., Disp: 60 tablet, Rfl: 1 .  lisinopril (ZESTRIL) 10 MG tablet, TAKE 1 TABLET BY MOUTH EVERY DAY, Disp: 90 tablet, Rfl: 3 .  metFORMIN (GLUCOPHAGE) 1000 MG tablet, TAKE 1 TABLET BY MOUTH TWICE A DAY, Disp: 180 tablet, Rfl: 3 .  pravastatin (PRAVACHOL) 80 MG tablet, Take 1 tablet (80 mg total) by mouth daily., Disp: 90 tablet, Rfl: 3 .  vitamin B-12 (CYANOCOBALAMIN) 1000 MCG tablet, Take 1 tablet (1,000 mcg total) by mouth daily. (Patient not taking: Reported on 05/21/2020), Disp: 90 tablet, Rfl: 3 .  Vitamin D, Ergocalciferol, (DRISDOL) 1.25 MG (50000 UNIT) CAPS capsule, Take 1 capsule (50,000 Units total) by mouth every 7 (seven) days. (Patient not taking: Reported on 05/21/2020), Disp: 12  capsule, Rfl: 0  Social History   Tobacco Use  Smoking Status Never Smoker  Smokeless Tobacco Never Used    No Known Allergies Objective:   There were no vitals filed for this visit. There is no height or weight on file to calculate BMI. Constitutional Well developed. Well nourished.  Vascular Dorsalis pedis pulses palpable bilaterally. Posterior tibial pulses palpable bilaterally. Capillary refill normal to all digits.  No cyanosis or clubbing noted. Pedal hair growth normal.  Neurologic Normal speech. Oriented to person, place, and time. Epicritic sensation to light touch grossly present bilaterally.  Dermatologic  dorsal foot ganglion cysts noted x1.  They are lobulated easily palpable.  Positive transillumination.  Not indurated.  Pain on palpation to the cyst.  Orthopedic: Normal joint ROM without pain or crepitus bilaterally. No visible deformities. No bony tenderness.   Radiographs: None Assessment:   1. Ganglion cyst of right foot   2. Soft tissue mass    Plan:  Patient was evaluated and treated and all questions answered.  Right foot dorsal ganglion cyst x 1 (dorsal forefoot) -All cyst were removed in the exact manners.  Using 18-gauge needle and 10 cc syringe the cyst were drained of all gelatinous material.  The material was discarded.  Following thorough exsanguination  of the cyst 10 cc Kenalog injections were injected to prevent recurrence. -Patient was placed in a cam boot given the significant/extensive and multiple lobulated cyst to allow for proper healing. -I explained to the patient that the cam boot is temporary and he can continue to wear it for next couple of weeks after that he can transition to regular sneakers.  If there are any other concerns per any acute signs of infection I told him to call me immediately or go to the emergency department right away.    No follow-ups on file.

## 2020-07-30 DIAGNOSIS — R195 Other fecal abnormalities: Secondary | ICD-10-CM | POA: Diagnosis not present

## 2020-08-07 ENCOUNTER — Ambulatory Visit
Admission: RE | Admit: 2020-08-07 | Discharge: 2020-08-07 | Disposition: A | Discharge status: Home / Self Care | Payer: PPO | Source: Ambulatory Visit | Attending: Physician Assistant | Admitting: Physician Assistant

## 2020-08-07 ENCOUNTER — Other Ambulatory Visit: Payer: Self-pay

## 2020-08-07 DIAGNOSIS — R195 Other fecal abnormalities: Secondary | ICD-10-CM

## 2020-08-07 DIAGNOSIS — K760 Fatty (change of) liver, not elsewhere classified: Secondary | ICD-10-CM | POA: Diagnosis not present

## 2020-08-07 DIAGNOSIS — R1013 Epigastric pain: Secondary | ICD-10-CM

## 2020-08-07 DIAGNOSIS — R1011 Right upper quadrant pain: Secondary | ICD-10-CM

## 2020-08-15 ENCOUNTER — Ambulatory Visit (INDEPENDENT_AMBULATORY_CARE_PROVIDER_SITE_OTHER): Payer: PPO | Admitting: Internal Medicine

## 2020-08-15 ENCOUNTER — Other Ambulatory Visit: Payer: Self-pay

## 2020-08-15 ENCOUNTER — Encounter: Payer: Self-pay | Admitting: Internal Medicine

## 2020-08-15 VITALS — BP 122/72 | HR 78 | Temp 98.3°F | Ht 69.0 in | Wt 290.0 lb

## 2020-08-15 DIAGNOSIS — Z8719 Personal history of other diseases of the digestive system: Secondary | ICD-10-CM | POA: Insufficient documentation

## 2020-08-15 DIAGNOSIS — R972 Elevated prostate specific antigen [PSA]: Secondary | ICD-10-CM | POA: Diagnosis not present

## 2020-08-15 DIAGNOSIS — Z0001 Encounter for general adult medical examination with abnormal findings: Secondary | ICD-10-CM | POA: Diagnosis not present

## 2020-08-15 DIAGNOSIS — K219 Gastro-esophageal reflux disease without esophagitis: Secondary | ICD-10-CM | POA: Diagnosis not present

## 2020-08-15 DIAGNOSIS — K7460 Unspecified cirrhosis of liver: Secondary | ICD-10-CM | POA: Diagnosis not present

## 2020-08-15 DIAGNOSIS — E559 Vitamin D deficiency, unspecified: Secondary | ICD-10-CM

## 2020-08-15 DIAGNOSIS — N1831 Chronic kidney disease, stage 3a: Secondary | ICD-10-CM | POA: Diagnosis not present

## 2020-08-15 DIAGNOSIS — E538 Deficiency of other specified B group vitamins: Secondary | ICD-10-CM | POA: Diagnosis not present

## 2020-08-15 DIAGNOSIS — E1165 Type 2 diabetes mellitus with hyperglycemia: Secondary | ICD-10-CM | POA: Diagnosis not present

## 2020-08-15 DIAGNOSIS — E7800 Pure hypercholesterolemia, unspecified: Secondary | ICD-10-CM

## 2020-08-15 DIAGNOSIS — K760 Fatty (change of) liver, not elsewhere classified: Secondary | ICD-10-CM

## 2020-08-15 DIAGNOSIS — N183 Chronic kidney disease, stage 3 unspecified: Secondary | ICD-10-CM | POA: Insufficient documentation

## 2020-08-15 LAB — LIPID PANEL
Cholesterol: 159 mg/dL (ref 0–200)
HDL: 51.4 mg/dL (ref >39.00)
LDL Cholesterol: 88 mg/dL (ref 0–99)
NonHDL: 107.56
Total CHOL/HDL Ratio: 3
Triglycerides: 100 mg/dL (ref 0.0–149.0)
VLDL: 20 mg/dL (ref 0.0–40.0)

## 2020-08-15 LAB — BASIC METABOLIC PANEL
BUN: 19 mg/dL (ref 6–23)
CO2: 26 mEq/L (ref 19–32)
Calcium: 10.1 mg/dL (ref 8.4–10.5)
Chloride: 106 mEq/L (ref 96–112)
Creatinine, Ser: 1.48 mg/dL (ref 0.40–1.50)
GFR: 47.53 mL/min — ABNORMAL LOW (ref >60.00)
Glucose, Bld: 77 mg/dL (ref 70–99)
Potassium: 4.6 mEq/L (ref 3.5–5.1)
Sodium: 142 mEq/L (ref 135–145)

## 2020-08-15 LAB — CBC WITH DIFFERENTIAL/PLATELET
Basophils Absolute: 0 10*3/uL (ref 0.0–0.1)
Basophils Relative: 0.3 % (ref 0.0–3.0)
Eosinophils Absolute: 0.1 10*3/uL (ref 0.0–0.7)
Eosinophils Relative: 0.9 % (ref 0.0–5.0)
HCT: 44.2 % (ref 39.0–52.0)
Hemoglobin: 14.8 g/dL (ref 13.0–17.0)
Lymphocytes Relative: 27.5 % (ref 12.0–46.0)
Lymphs Abs: 2.1 10*3/uL (ref 0.7–4.0)
MCHC: 33.5 g/dL (ref 30.0–36.0)
MCV: 92 fl (ref 78.0–100.0)
Monocytes Absolute: 0.6 10*3/uL (ref 0.1–1.0)
Monocytes Relative: 7.6 % (ref 3.0–12.0)
Neutro Abs: 4.8 10*3/uL (ref 1.4–7.7)
Neutrophils Relative %: 63.7 % (ref 43.0–77.0)
Platelets: 189 10*3/uL (ref 150.0–400.0)
RBC: 4.81 Mil/uL (ref 4.22–5.81)
RDW: 14.4 % (ref 11.5–15.5)
WBC: 7.5 10*3/uL (ref 4.0–10.5)

## 2020-08-15 LAB — HEPATIC FUNCTION PANEL
ALT: 18 U/L (ref 0–53)
AST: 21 U/L (ref 0–37)
Albumin: 4.5 g/dL (ref 3.5–5.2)
Alkaline Phosphatase: 63 U/L (ref 39–117)
Bilirubin, Direct: 0.2 mg/dL (ref 0.0–0.3)
Total Bilirubin: 1.1 mg/dL (ref 0.2–1.2)
Total Protein: 7.3 g/dL (ref 6.0–8.3)

## 2020-08-15 LAB — URINALYSIS, ROUTINE W REFLEX MICROSCOPIC
Bilirubin Urine: NEGATIVE
Ketones, ur: NEGATIVE
Leukocytes,Ua: NEGATIVE
Nitrite: NEGATIVE
Specific Gravity, Urine: 1.02 (ref 1.000–1.030)
Urine Glucose: NEGATIVE
Urobilinogen, UA: 0.2 (ref 0.0–1.0)
pH: 5 (ref 5.0–8.0)

## 2020-08-15 LAB — PSA: PSA: 0.38 ng/mL (ref 0.10–4.00)

## 2020-08-15 LAB — HEMOGLOBIN A1C: Hgb A1c MFr Bld: 6.6 % — ABNORMAL HIGH (ref 4.6–6.5)

## 2020-08-15 LAB — PROTIME-INR
INR: 1.1 ratio — ABNORMAL HIGH (ref 0.8–1.0)
Prothrombin Time: 12.8 s (ref 9.6–13.1)

## 2020-08-15 LAB — TSH: TSH: 1.62 u[IU]/mL (ref 0.35–4.50)

## 2020-08-15 LAB — PHOSPHORUS: Phosphorus: 3.2 mg/dL (ref 2.3–4.6)

## 2020-08-15 LAB — VITAMIN D 25 HYDROXY (VIT D DEFICIENCY, FRACTURES): VITD: 24.73 ng/mL — ABNORMAL LOW (ref 30.00–100.00)

## 2020-08-15 LAB — VITAMIN B12: Vitamin B-12: 261 pg/mL (ref 211–911)

## 2020-08-15 NOTE — Progress Notes (Signed)
Patient ID: Roger Alvarado, male   DOB: [birth year], 70 y.o.   MRN: [id number]         Chief Complaint:: wellness exam and Follow-up (F/u from GI)  , lo vit d, low b12, fatty liver, cirrhosis, diverticulosis, ckd 3 and gerd       HPI:  Roger Alvarado is a 70 y.o. male here for wellness exam; plans to f/u eye exam with [facility] ophthalmology. O/w up to date with preventive referrals and immunizations                        Also had hx of pancreatitis and diverticulosis with Dr Rosalin Hawking (now retired) now seeing Dr Deliah Goody. ([facility]).  Recently told he had fatty liver and CKD. Denies worsening reflux, abd pain, dysphagia, n/v, bowel change or blood.  Not taking vit d but is taking occasional B12 supplements.  Pt denies chest pain, increased sob or doe, wheezing, orthopnea, PND, increased LE swelling, palpitations, dizziness or syncope.   Pt denies polydipsia, polyuria, or new focal neuro s/s.   Wt down from about 306 to curren 290 with walking 2 miles at least 3 times week.   Pt denies fever, wt loss, night sweats, loss of appetite, or other constitutional symptoms  No other new complaints Wt Readings from Last 3 Encounters:  08/15/20 290 lb (131.5 kg)  05/21/20 (!) 306 lb (138.8 kg)  08/07/19 (!) 306 lb (138.8 kg)   BP Readings from Last 3 Encounters:  08/15/20 122/72  05/21/20 136/70  08/07/19 140/80   Immunization History  Administered Date(s) Administered  . Fluad Quad(high Dose 65+) 12/13/2018  . Influenza, High Dose Seasonal PF 12/29/2016, 01/03/2018  . Influenza-Unspecified 01/17/2020  . PFIZER(Purple Top)SARS-COV-2 Vaccination 04/23/2019, 05/18/2019, 01/17/2020  . Pneumococcal Conjugate-13 08/28/2015  . Pneumococcal Polysaccharide-23 09/03/2016  . Tdap 08/28/2015   Health Maintenance Due  Topic Date Due  . OPHTHALMOLOGY EXAM  02/27/2020      Past Medical History:  Diagnosis Date  . Allergy   . Arthritis   . Asthma    Childhood  . Diabetes mellitus   . Diverticulitis    . Diverticulosis   . Gout   . Gout   . Hyperlipidemia   . Obesity, Class III, BMI 40-49.9 (morbid obesity) ([place])    History reviewed. No pertinent surgical history.  reports that he has never smoked. He has never used smokeless tobacco. He reports current alcohol use of about 2.0 standard drinks of alcohol per week. He reports that he does not use drugs. family history includes Asthma in his mother; Diabetes in his brother, brother, father, and sister; Heart attack in his maternal grandfather; Hypertension in his brother, brother, and father. No Known Allergies Current Outpatient Medications on File Prior to Visit  Medication Sig Dispense Refill  . aspirin 81 MG tablet Take 81 mg by mouth daily.    . colchicine 0.6 MG tablet TAKE 2 TABLETS BY MOUTH AT ONSET OF FLARE AND THEN 1 TABLET 1 HOUR LATER 180 tablet 1  . lisinopril (ZESTRIL) 10 MG tablet TAKE 1 TABLET BY MOUTH EVERY DAY 90 tablet 3  . metFORMIN (GLUCOPHAGE) 1000 MG tablet TAKE 1 TABLET BY MOUTH TWICE A DAY 180 tablet 3  . pravastatin (PRAVACHOL) 80 MG tablet Take 1 tablet (80 mg total) by mouth daily. 90 tablet 3  . vitamin B-12 (CYANOCOBALAMIN) 1000 MCG tablet Take 1 tablet (1,000 mcg total) by mouth daily. 90 tablet 3  .  Vitamin D, Ergocalciferol, (DRISDOL) 1.25 MG (50000 UNIT) CAPS capsule Take 1 capsule (50,000 Units total) by mouth every 7 (seven) days. 12 capsule 0  . ibuprofen (ADVIL) 800 MG tablet Take 1 tablet (800 mg total) by mouth every 6 (six) hours as needed. (Patient not taking: Reported on 08/15/2020) 60 tablet 1   No current facility-administered medications on file prior to visit.        ROS:  All others reviewed and negative.  Objective        PE:  BP 122/72 (BP Location: Right Arm, Patient Position: Sitting, Cuff Size: Large)   Pulse 78   Temp 98.3 F (36.8 C) (Oral)   Ht 5\' 9"  (1.753 m)   Wt 290 lb (131.5 kg)   SpO2 97%   BMI 42.83 kg/m                 Constitutional: Pt appears in NAD                HENT: Head: NCAT.                Right Ear: External ear normal.                 Left Ear: External ear normal.                Eyes: . Pupils are equal, round, and reactive to light. Conjunctivae and EOM are normal               Nose: without d/c or deformity               Neck: Neck supple. Gross normal ROM               Cardiovascular: Normal rate and regular rhythm.                 Pulmonary/Chest: Effort normal and breath sounds without rales or wheezing.                Abd:  Soft, NT, ND, + BS, no organomegaly               Neurological: Pt is alert. At baseline orientation, motor grossly intact               Skin: Skin is warm. No rashes, no other new lesions, LE edema - none               Psychiatric: Pt behavior is normal without agitation   Micro: none  Cardiac tracings I have personally interpreted today:  none  Pertinent Radiological findings (summarize): none   Lab Results  Component Value Date   WBC 7.5 08/15/2020   HGB 14.8 08/15/2020   HCT 44.2 08/15/2020   PLT 189.0 08/15/2020   GLUCOSE 77 08/15/2020   CHOL 159 08/15/2020   TRIG 100.0 08/15/2020   HDL 51.40 08/15/2020   LDLCALC 88 08/15/2020   ALT 18 08/15/2020   AST 21 08/15/2020   NA 142 08/15/2020   K 4.6 08/15/2020   CL 106 08/15/2020   CREATININE 1.48 08/15/2020   BUN 19 08/15/2020   CO2 26 08/15/2020   TSH 1.62 08/15/2020   PSA 0.38 08/15/2020   INR 1.1 (H) 08/15/2020   HGBA1C 6.6 (H) 08/15/2020   MICROALBUR 135.3 (H) 08/07/2019   Assessment/Plan:  Roger Alvarado is a 70 y.o. Black or African American [2] male with  has a past medical history of Allergy, Arthritis, Asthma, Diabetes  mellitus, Diverticulitis, Diverticulosis, Gout, Gout, Hyperlipidemia, and Obesity, Class III, BMI 40-49.9 (morbid obesity) ([place]).  Encounter for well adult exam with abnormal findings Age and sex appropriate education and counseling updated with regular exercise and diet Referrals for preventative services - for eye  exam soon Immunizations addressed - none needed Smoking counseling  - none needed Evidence for depression or other mood disorder - none significant Most recent labs reviewed. I have personally reviewed and have noted: 1) the patient's medical and social history 2) The patient's current medications and supplements 3) The patient's height, weight, and BMI have been recorded in the chart   Vitamin D deficiency Last vitamin D Lab Results  Component Value Date   VD25OH 24.73 (L) 08/15/2020   Low, to start oral replacement   B12 deficiency Lab Results  Component Value Date   VITAMINB12 261 08/15/2020   Stable, cont oral replacement - b12 1000 mcg qd  Type 2 diabetes mellitus ([place]) Lab Results  Component Value Date   HGBA1C 6.6 (H) 08/15/2020   Stable, pt to continue current medical treatment - metformin   Hyperlipidemia Lab Results  Component Value Date   LDLCALC 88 08/15/2020   Stable, pt to continue current statin paravastatin   GERD (gastroesophageal reflux disease) Stable, pt declines PPI for now,  to f/u any worsening symptoms or concerns   Fatty liver For vigourous exercise and wt control,  to f/u any worsening symptoms or concerns  CKD (chronic kidney disease) stage 3, GFR 30-59 ml/min (HCC) Lab Results  Component Value Date   CREATININE 1.48 08/15/2020   Stable overall, cont to avoid nephrotoxins  Cirrhosis ([place]) No s/s stigmata CLD today, for contd f/u GI as planned  Followup: Return in about 6 months (around 02/15/2021).  Cathlean Cower, MD 08/21/2020 5:37 AM [facility]

## 2020-08-15 NOTE — Patient Instructions (Addendum)
Please remember to call for your yearly eye exam with [facility]  Please continue all other medications as before, and refills have been done if requested.  Please have the pharmacy call with any other refills you may need.  Please continue your efforts at being more active, low cholesterol diet, and weight control.  You are otherwise up to date with prevention measures today.  Please keep your appointments with your specialists as you may have planned  Please go to the LAB at the blood drawing area for the tests to be done  You will be contacted by phone if any changes need to be made immediately.  Otherwise, you will receive a letter about your results with an explanation, but please check with MyChart first.  Please make an Appointment to return in 6 months, or sooner if needed, also with Lab Appointment for testing done 3-5 days before at the [facility] (so this is for TWO appointments - please see the scheduling desk as you leave)  Due to the ongoing Covid 19 pandemic, our lab now requires an appointment for any labs done at our office.  If you need labs done and do not have an appointment, please call our office ahead of time to schedule before presenting to the lab for your testing.

## 2020-08-18 LAB — PTH, INTACT AND CALCIUM
Calcium: 10.2 mg/dL (ref 8.6–10.3)
PTH: 23 pg/mL (ref 16–77)

## 2020-08-19 ENCOUNTER — Other Ambulatory Visit: Payer: Self-pay | Admitting: Internal Medicine

## 2020-08-19 ENCOUNTER — Encounter: Payer: Self-pay | Admitting: Internal Medicine

## 2020-08-19 DIAGNOSIS — R109 Unspecified abdominal pain: Secondary | ICD-10-CM

## 2020-08-19 DIAGNOSIS — Z87442 Personal history of urinary calculi: Secondary | ICD-10-CM

## 2020-08-19 DIAGNOSIS — R3129 Other microscopic hematuria: Secondary | ICD-10-CM

## 2020-08-21 ENCOUNTER — Encounter: Payer: Self-pay | Admitting: Internal Medicine

## 2020-08-21 NOTE — Assessment & Plan Note (Signed)
Last vitamin D Lab Results  Component Value Date   VD25OH 24.73 (L) 08/15/2020   Low, to start oral replacement

## 2020-08-21 NOTE — Assessment & Plan Note (Signed)
Lab Results  Component Value Date   LDLCALC 88 08/15/2020   Stable, pt to continue current statin paravastatin

## 2020-08-21 NOTE — Assessment & Plan Note (Signed)
Stable, pt declines PPI for now,  to f/u any worsening symptoms or concerns

## 2020-08-21 NOTE — Assessment & Plan Note (Signed)
Lab Results  Component Value Date   CREATININE 1.48 08/15/2020   Stable overall, cont to avoid nephrotoxins

## 2020-08-21 NOTE — Assessment & Plan Note (Signed)
Lab Results  Component Value Date   VITAMINB12 261 08/15/2020   Stable, cont oral replacement - b12 1000 mcg qd

## 2020-08-21 NOTE — Assessment & Plan Note (Signed)
For vigourous exercise and wt control,  to f/u any worsening symptoms or concerns

## 2020-08-21 NOTE — Assessment & Plan Note (Signed)
Age and sex appropriate education and counseling updated with regular exercise and diet Referrals for preventative services - for eye exam soon Immunizations addressed - none needed Smoking counseling  - none needed Evidence for depression or other mood disorder - none significant Most recent labs reviewed. I have personally reviewed and have noted: 1) the patient's medical and social history 2) The patient's current medications and supplements 3) The patient's height, weight, and BMI have been recorded in the chart

## 2020-08-21 NOTE — Assessment & Plan Note (Signed)
Lab Results  Component Value Date   HGBA1C 6.6 (H) 08/15/2020   Stable, pt to continue current medical treatment - metformin

## 2020-08-21 NOTE — Assessment & Plan Note (Signed)
No s/s stigmata CLD today, for contd f/u GI as planned

## 2020-08-28 ENCOUNTER — Other Ambulatory Visit: Payer: Self-pay | Admitting: Internal Medicine

## 2020-08-29 ENCOUNTER — Other Ambulatory Visit: Payer: Self-pay

## 2020-08-29 ENCOUNTER — Telehealth: Payer: Self-pay | Admitting: Internal Medicine

## 2020-08-29 MED ORDER — METFORMIN HCL 1000 MG PO TABS: 1.0000 | ORAL_TABLET | Freq: Two times a day (BID) | ORAL | 3 refills | Status: DC

## 2020-08-29 NOTE — Telephone Encounter (Signed)
Please refill as per office routine med refill policy (all routine meds refilled for 3 mo or monthly per pt preference up to one year from last visit, then month to month grace period for 3 mo, then further med refills will have to be denied)  

## 2020-08-29 NOTE — Telephone Encounter (Signed)
1.Medication Requested: metFORMIN (GLUCOPHAGE) 1000 MG tablet    2. Pharmacy (Name, Street, [place]): [id number] - [place], [place]  3. On Med List: yes   4. Last Visit with PCP: 08-15-20  5. Next visit date with PCP: n/a    Agent: Please be advised that RX refills may take up to 3 business days. We ask that you follow-up with your pharmacy.

## 2020-09-22 DIAGNOSIS — R1013 Epigastric pain: Secondary | ICD-10-CM | POA: Diagnosis not present

## 2020-09-22 DIAGNOSIS — K760 Fatty (change of) liver, not elsewhere classified: Secondary | ICD-10-CM | POA: Diagnosis not present

## 2020-09-22 DIAGNOSIS — N189 Chronic kidney disease, unspecified: Secondary | ICD-10-CM | POA: Diagnosis not present

## 2020-09-22 DIAGNOSIS — K7460 Unspecified cirrhosis of liver: Secondary | ICD-10-CM | POA: Diagnosis not present

## 2020-09-22 DIAGNOSIS — E669 Obesity, unspecified: Secondary | ICD-10-CM | POA: Diagnosis not present

## 2020-09-22 DIAGNOSIS — R748 Abnormal levels of other serum enzymes: Secondary | ICD-10-CM | POA: Diagnosis not present

## 2020-10-13 DIAGNOSIS — R3121 Asymptomatic microscopic hematuria: Secondary | ICD-10-CM | POA: Diagnosis not present

## 2020-10-13 DIAGNOSIS — N200 Calculus of kidney: Secondary | ICD-10-CM | POA: Diagnosis not present

## 2020-10-24 ENCOUNTER — Encounter: Payer: Self-pay | Admitting: Podiatry

## 2020-10-24 ENCOUNTER — Ambulatory Visit: Payer: PPO | Admitting: Podiatry

## 2020-10-24 ENCOUNTER — Other Ambulatory Visit: Payer: Self-pay

## 2020-10-24 DIAGNOSIS — M67471 Ganglion, right ankle and foot: Secondary | ICD-10-CM

## 2020-10-24 DIAGNOSIS — M7989 Other specified soft tissue disorders: Secondary | ICD-10-CM | POA: Diagnosis not present

## 2020-10-24 DIAGNOSIS — Z9889 Other specified postprocedural states: Secondary | ICD-10-CM | POA: Diagnosis not present

## 2020-10-24 NOTE — Progress Notes (Signed)
Subjective:  Patient ID: Roger Alvarado, male    DOB: 1951/02/23,  MRN: [id number]  Chief Complaint  Patient presents with   Cyst    Right foot     70 y.o. male presents with the above complaint.  Patient presents with recurrence of right dorsal foot ganglion cyst.  Patient has a history of previous removal ganglion cyst which seems to be doing well.  This is in a different location on the dorsal forefoot.  He had it previously drained but seems to be coming back.  He would like to have it surgically excised out.  He denies any other acute complaints.   Review of Systems: Negative except as noted in the HPI. Denies N/V/F/Ch.  Past Medical History:  Diagnosis Date   Allergy    Arthritis    Asthma    Childhood   Diabetes mellitus    Diverticulitis    Diverticulosis    Gout    Gout    Hyperlipidemia    Obesity, Class III, BMI 40-49.9 (morbid obesity) ([place])     Current Outpatient Medications:    aspirin 81 MG tablet, Take 81 mg by mouth daily., Disp: , Rfl:    colchicine 0.6 MG tablet, TAKE 2 TABLETS BY MOUTH AT ONSET OF FLARE AND THEN 1 TABLET 1 HOUR LATER, Disp: 180 tablet, Rfl: 1   ibuprofen (ADVIL) 800 MG tablet, Take 1 tablet (800 mg total) by mouth every 6 (six) hours as needed. (Patient not taking: Reported on 08/15/2020), Disp: 60 tablet, Rfl: 1   lisinopril (ZESTRIL) 10 MG tablet, TAKE 1 TABLET BY MOUTH EVERY DAY, Disp: 90 tablet, Rfl: 3   metFORMIN (GLUCOPHAGE) 1000 MG tablet, Take 1 tablet (1,000 mg total) by mouth 2 (two) times daily., Disp: 180 tablet, Rfl: 3   pravastatin (PRAVACHOL) 80 MG tablet, Take 1 tablet (80 mg total) by mouth daily., Disp: 90 tablet, Rfl: 3   vitamin B-12 (CYANOCOBALAMIN) 1000 MCG tablet, Take 1 tablet (1,000 mcg total) by mouth daily., Disp: 90 tablet, Rfl: 3   Vitamin D, Ergocalciferol, (DRISDOL) 1.25 MG (50000 UNIT) CAPS capsule, Take 1 capsule (50,000 Units total) by mouth every 7 (seven) days., Disp: 12 capsule, Rfl: 0  Social History    Tobacco Use  Smoking Status Never  Smokeless Tobacco Never    No Known Allergies Objective:   There were no vitals filed for this visit. There is no height or weight on file to calculate BMI. Constitutional Well developed. Well nourished.  Vascular Dorsalis pedis pulses palpable bilaterally. Posterior tibial pulses palpable bilaterally. Capillary refill normal to all digits.  No cyanosis or clubbing noted. Pedal hair growth normal.  Neurologic Normal speech. Oriented to person, place, and time. Epicritic sensation to light touch grossly present bilaterally.  Dermatologic Dorsal forefoot ganglion cysts noted x1.  They are single lobulated easily palpable.  Positive transillumination.  Not indurated.  Pain on palpation to the cyst.  Orthopedic: Normal joint ROM without pain or crepitus bilaterally. No visible deformities. No bony tenderness.   Radiographs: None Assessment:   1. Ganglion cyst of right foot   2. Soft tissue mass   3. Status post foot surgery     Plan:  Patient was evaluated and treated and all questions answered.  Right foot dorsal ganglion cyst x1 dorsal forefoot -It appears that it has recurred.  At this time I discussed with the patient that ganglion cyst tends to recur especially without fully excision.  At this time patient does not  want any further aspiration of the mass at this time.  He would like to have the soft tissue mass/tumor completely excised out.  I discussed with the patient my postop protocol including incision placement.  The patient states understanding he will be no weightbearing as tolerated the boot.  Patient already has a boot from the past and will bring it with him to the surgery center.  I plan on performing excision of the right dorsal foot ganglion cyst -I discussed my postop protocol in extensive detail including weightbearing as tolerated in the shoe followed by transition to regular sneaker -Informed surgical risk consent was  reviewed and read aloud to the patient.  I reviewed the films.  I have discussed my findings with the patient in great detail.  I have discussed all risks including but not limited to infection, stiffness, scarring, limp, disability, deformity, damage to blood vessels and nerves, numbness, poor healing, need for braces, arthritis, chronic pain, amputation, death.  All benefits and realistic expectations discussed in great detail.  I have made no promises as to the outcome.  I have provided realistic expectations.  I have offered the patient a 2nd opinion, which they have declined and assured me they preferred to proceed despite the risks -A total of 36 minutes was spent in direct patient care as well as pre and post patient encounter activities.  This includes documentation as well as reviewing patient chart for labs, imaging, past medical, surgical, social, and family history as documented in the EMR.  I have reviewed medication allergies as documented in EMR.  I discussed the etiology of condition and treatment options from conservative to surgical care.  All risks and benefit of the treatment course was discussed in detail.  All questions were answered and return appointment was discussed.  Since the visit completed in an ambulatory/outpatient setting, the patient and/or parent/guardian has been advised to contact the providers office for worsening condition and seek medical treatment and/or call 911 if the patient deems either is necessary.     No follow-ups on file.

## 2020-10-29 ENCOUNTER — Telehealth: Payer: Self-pay | Admitting: Urology

## 2020-10-29 NOTE — Telephone Encounter (Signed)
DOS - 11/10/20  EXC SOFT TISSUE MASS/TUMOR RIGHT --- 28045   HEALTH TEAM EFFECTIVE DATE - 10/03/16   RECEIVED FAX FROM HEALTH TEAM STATING CPT CODE 84166 HAS BEEN APPROVED, [facility] # [id number], GOOD FROM 11/10/20 - 02/08/21.

## 2020-11-10 ENCOUNTER — Other Ambulatory Visit: Payer: Self-pay | Admitting: Podiatry

## 2020-11-10 DIAGNOSIS — D492 Neoplasm of unspecified behavior of bone, soft tissue, and skin: Secondary | ICD-10-CM | POA: Diagnosis not present

## 2020-11-10 DIAGNOSIS — M7989 Other specified soft tissue disorders: Secondary | ICD-10-CM | POA: Diagnosis not present

## 2020-11-10 DIAGNOSIS — M67471 Ganglion, right ankle and foot: Secondary | ICD-10-CM | POA: Diagnosis not present

## 2020-11-10 MED ORDER — IBUPROFEN 800 MG PO TABS: 800.0000 mg | ORAL_TABLET | Freq: Four times a day (QID) | ORAL | 1 refills | Status: DC | PRN

## 2020-11-10 MED ORDER — OXYCODONE-ACETAMINOPHEN 5-325 MG PO TABS: 1.0000 | ORAL_TABLET | ORAL | 0 refills | Status: DC | PRN

## 2020-11-19 ENCOUNTER — Ambulatory Visit (INDEPENDENT_AMBULATORY_CARE_PROVIDER_SITE_OTHER): Payer: PPO | Admitting: Podiatry

## 2020-11-19 ENCOUNTER — Other Ambulatory Visit: Payer: Self-pay

## 2020-11-19 DIAGNOSIS — Z9889 Other specified postprocedural states: Secondary | ICD-10-CM

## 2020-11-19 DIAGNOSIS — M7989 Other specified soft tissue disorders: Secondary | ICD-10-CM

## 2020-11-19 DIAGNOSIS — M67471 Ganglion, right ankle and foot: Secondary | ICD-10-CM

## 2020-11-21 NOTE — Progress Notes (Signed)
  Subjective:  Patient ID: Roger Alvarado, male    DOB: 10-01-1950,  MRN: [id number]  Chief Complaint  Patient presents with   Routine Post Op    POS DOS 8.8.22    DOS: 11/10/2020 Procedure: Excision of ganglion cyst right foot  70 y.o. male returns for post-op check.  Patient states that he is doing well.  Has some numbness tingling.  Overall no pain.  He has been controlled with pain medications ambulating with a surgical shoe.  Review of Systems: Negative except as noted in the HPI. Denies N/V/F/Ch.  Past Medical History:  Diagnosis Date   Allergy    Arthritis    Asthma    Childhood   Diabetes mellitus    Diverticulitis    Diverticulosis    Gout    Gout    Hyperlipidemia    Obesity, Class III, BMI 40-49.9 (morbid obesity) ([place])     Current Outpatient Medications:    aspirin 81 MG tablet, Take 81 mg by mouth daily., Disp: , Rfl:    colchicine 0.6 MG tablet, TAKE 2 TABLETS BY MOUTH AT ONSET OF FLARE AND THEN 1 TABLET 1 HOUR LATER, Disp: 180 tablet, Rfl: 1   ibuprofen (ADVIL) 800 MG tablet, Take 1 tablet (800 mg total) by mouth every 6 (six) hours as needed. (Patient not taking: Reported on 08/15/2020), Disp: 60 tablet, Rfl: 1   ibuprofen (ADVIL) 800 MG tablet, Take 1 tablet (800 mg total) by mouth every 6 (six) hours as needed., Disp: 60 tablet, Rfl: 1   lisinopril (ZESTRIL) 10 MG tablet, TAKE 1 TABLET BY MOUTH EVERY DAY, Disp: 90 tablet, Rfl: 3   metFORMIN (GLUCOPHAGE) 1000 MG tablet, Take 1 tablet (1,000 mg total) by mouth 2 (two) times daily., Disp: 180 tablet, Rfl: 3   oxyCODONE-acetaminophen (PERCOCET) 5-325 MG tablet, Take 1 tablet by mouth every 4 (four) hours as needed for severe pain., Disp: 30 tablet, Rfl: 0   pravastatin (PRAVACHOL) 80 MG tablet, Take 1 tablet (80 mg total) by mouth daily., Disp: 90 tablet, Rfl: 3   vitamin B-12 (CYANOCOBALAMIN) 1000 MCG tablet, Take 1 tablet (1,000 mcg total) by mouth daily., Disp: 90 tablet, Rfl: 3   Vitamin D, Ergocalciferol,  (DRISDOL) 1.25 MG (50000 UNIT) CAPS capsule, Take 1 capsule (50,000 Units total) by mouth every 7 (seven) days., Disp: 12 capsule, Rfl: 0  Social History   Tobacco Use  Smoking Status Never  Smokeless Tobacco Never    No Known Allergies Objective:  There were no vitals filed for this visit. There is no height or weight on file to calculate BMI. Constitutional Well developed. Well nourished.  Vascular Foot warm and well perfused. Capillary refill normal to all digits.   Neurologic Normal speech. Oriented to person, place, and time. Epicritic sensation to light touch grossly present bilaterally.  Dermatologic Skin healing well without signs of infection. Skin edges well coapted without signs of infection.  Orthopedic: Tenderness to palpation noted about the surgical site.   Radiographs: None Assessment:   1. Ganglion cyst of right foot   2. Soft tissue mass   3. Status post foot surgery    Plan:  Patient was evaluated and treated and all questions answered.  S/p foot surgery right -Progressing as expected post-operatively. -XR: None -WB Status: Weightbearing as tolerated in surgical shoe -Sutures: Intact. -Medications: None -Foot redressed.  No follow-ups on file.

## 2020-12-03 ENCOUNTER — Ambulatory Visit (INDEPENDENT_AMBULATORY_CARE_PROVIDER_SITE_OTHER): Payer: PPO | Admitting: Podiatry

## 2020-12-03 ENCOUNTER — Other Ambulatory Visit: Payer: Self-pay

## 2020-12-03 DIAGNOSIS — Z9889 Other specified postprocedural states: Secondary | ICD-10-CM

## 2020-12-03 DIAGNOSIS — M67471 Ganglion, right ankle and foot: Secondary | ICD-10-CM

## 2020-12-03 DIAGNOSIS — M7989 Other specified soft tissue disorders: Secondary | ICD-10-CM

## 2020-12-04 NOTE — Progress Notes (Signed)
Subjective:  Patient ID: Roger Alvarado, male    DOB: December 29, 1950,  MRN: [id number]  Chief Complaint  Patient presents with   Routine Post Benard Rink 8.8.22    DOS: 11/10/2020 Procedure: Excision of ganglion cyst right foot  70 y.o. male returns for post-op check.  Patient states that he is doing well.  Has some numbness tingling.  Overall no pain.  He has been controlled with pain medications ambulating with a surgical shoe.  Review of Systems: Negative except as noted in the HPI. Denies N/V/F/Ch.  Past Medical History:  Diagnosis Date   Allergy    Arthritis    Asthma    Childhood   Diabetes mellitus    Diverticulitis    Diverticulosis    Gout    Gout    Hyperlipidemia    Obesity, Class III, BMI 40-49.9 (morbid obesity) ([place])     Current Outpatient Medications:    aspirin 81 MG tablet, Take 81 mg by mouth daily., Disp: , Rfl:    colchicine 0.6 MG tablet, TAKE 2 TABLETS BY MOUTH AT ONSET OF FLARE AND THEN 1 TABLET 1 HOUR LATER, Disp: 180 tablet, Rfl: 1   ibuprofen (ADVIL) 800 MG tablet, Take 1 tablet (800 mg total) by mouth every 6 (six) hours as needed. (Patient not taking: Reported on 08/15/2020), Disp: 60 tablet, Rfl: 1   ibuprofen (ADVIL) 800 MG tablet, Take 1 tablet (800 mg total) by mouth every 6 (six) hours as needed., Disp: 60 tablet, Rfl: 1   lisinopril (ZESTRIL) 10 MG tablet, TAKE 1 TABLET BY MOUTH EVERY DAY, Disp: 90 tablet, Rfl: 3   metFORMIN (GLUCOPHAGE) 1000 MG tablet, Take 1 tablet (1,000 mg total) by mouth 2 (two) times daily., Disp: 180 tablet, Rfl: 3   oxyCODONE-acetaminophen (PERCOCET) 5-325 MG tablet, Take 1 tablet by mouth every 4 (four) hours as needed for severe pain., Disp: 30 tablet, Rfl: 0   pravastatin (PRAVACHOL) 80 MG tablet, Take 1 tablet (80 mg total) by mouth daily., Disp: 90 tablet, Rfl: 3   vitamin B-12 (CYANOCOBALAMIN) 1000 MCG tablet, Take 1 tablet (1,000 mcg total) by mouth daily., Disp: 90 tablet, Rfl: 3   Vitamin D, Ergocalciferol,  (DRISDOL) 1.25 MG (50000 UNIT) CAPS capsule, Take 1 capsule (50,000 Units total) by mouth every 7 (seven) days., Disp: 12 capsule, Rfl: 0  Social History   Tobacco Use  Smoking Status Never  Smokeless Tobacco Never    No Known Allergies Objective:  There were no vitals filed for this visit. There is no height or weight on file to calculate BMI. Constitutional Well developed. Well nourished.  Vascular Foot warm and well perfused. Capillary refill normal to all digits.   Neurologic Normal speech. Oriented to person, place, and time. Epicritic sensation to light touch grossly present bilaterally.  Dermatologic Skin was partially very epithelialized.  Hematoma noted centrally which was expressed.  No clinical signs of infection noted no purulent drainage noted  Orthopedic: No tenderness to palpation noted about the surgical site.   Radiographs: None Assessment:   1. Ganglion cyst of right foot   2. Soft tissue mass   3. Status post foot surgery     Plan:  Patient was evaluated and treated and all questions answered.  S/p foot surgery right -Progressing as expected post-operatively. -XR: None -WB Status: Weightbearing as tolerated in surgical shoe -Sutures: Removed superficial hematoma was expressed from the incision.  Superficial dehiscence noted.  No signs of infection noted. -Medications: None  I discussed with the patient the healing time will be a little bit longer.  Patient states understanding.  No follow-ups on file.

## 2020-12-19 ENCOUNTER — Ambulatory Visit (INDEPENDENT_AMBULATORY_CARE_PROVIDER_SITE_OTHER): Payer: PPO | Admitting: Podiatry

## 2020-12-19 ENCOUNTER — Other Ambulatory Visit: Payer: Self-pay

## 2020-12-19 DIAGNOSIS — M7989 Other specified soft tissue disorders: Secondary | ICD-10-CM

## 2020-12-19 DIAGNOSIS — Z9889 Other specified postprocedural states: Secondary | ICD-10-CM

## 2020-12-19 DIAGNOSIS — M67471 Ganglion, right ankle and foot: Secondary | ICD-10-CM

## 2020-12-19 NOTE — Progress Notes (Signed)
Subjective:  Patient ID: Roger Alvarado, male    DOB: Apr 20, 1950,  MRN: [id number]  Chief Complaint  Patient presents with   Routine Post Benard Rink 8.8.22     DOS: 11/10/2020 Procedure: Excision of ganglion cyst right foot  70 y.o. male returns for post-op check.  Patient states that he is doing well.  Has some numbness tingling.  Overall no pain.  He has been controlled with pain medications ambulating with a surgical shoe.  Review of Systems: Negative except as noted in the HPI. Denies N/V/F/Ch.  Past Medical History:  Diagnosis Date   Allergy    Arthritis    Asthma    Childhood   Diabetes mellitus    Diverticulitis    Diverticulosis    Gout    Gout    Hyperlipidemia    Obesity, Class III, BMI 40-49.9 (morbid obesity) ([place])     Current Outpatient Medications:    aspirin 81 MG tablet, Take 81 mg by mouth daily., Disp: , Rfl:    colchicine 0.6 MG tablet, TAKE 2 TABLETS BY MOUTH AT ONSET OF FLARE AND THEN 1 TABLET 1 HOUR LATER, Disp: 180 tablet, Rfl: 1   ibuprofen (ADVIL) 800 MG tablet, Take 1 tablet (800 mg total) by mouth every 6 (six) hours as needed. (Patient not taking: Reported on 08/15/2020), Disp: 60 tablet, Rfl: 1   ibuprofen (ADVIL) 800 MG tablet, Take 1 tablet (800 mg total) by mouth every 6 (six) hours as needed., Disp: 60 tablet, Rfl: 1   lisinopril (ZESTRIL) 10 MG tablet, TAKE 1 TABLET BY MOUTH EVERY DAY, Disp: 90 tablet, Rfl: 3   metFORMIN (GLUCOPHAGE) 1000 MG tablet, Take 1 tablet (1,000 mg total) by mouth 2 (two) times daily., Disp: 180 tablet, Rfl: 3   oxyCODONE-acetaminophen (PERCOCET) 5-325 MG tablet, Take 1 tablet by mouth every 4 (four) hours as needed for severe pain., Disp: 30 tablet, Rfl: 0   pravastatin (PRAVACHOL) 80 MG tablet, Take 1 tablet (80 mg total) by mouth daily., Disp: 90 tablet, Rfl: 3   vitamin B-12 (CYANOCOBALAMIN) 1000 MCG tablet, Take 1 tablet (1,000 mcg total) by mouth daily., Disp: 90 tablet, Rfl: 3   Vitamin D, Ergocalciferol,  (DRISDOL) 1.25 MG (50000 UNIT) CAPS capsule, Take 1 capsule (50,000 Units total) by mouth every 7 (seven) days., Disp: 12 capsule, Rfl: 0  Social History   Tobacco Use  Smoking Status Never  Smokeless Tobacco Never    No Known Allergies Objective:  There were no vitals filed for this visit. There is no height or weight on file to calculate BMI. Constitutional Well developed. Well nourished.  Vascular Foot warm and well perfused. Capillary refill normal to all digits.   Neurologic Normal speech. Oriented to person, place, and time. Epicritic sensation to light touch grossly present bilaterally.  Dermatologic Skin was partially very epithelialized.  No further hematoma noted.  No dehiscence noted.  No scab formation noted.  Skin has very epithelialized.  Orthopedic: No tenderness to palpation noted about the surgical site.   Radiographs: None Assessment:   1. Ganglion cyst of right foot   2. Soft tissue mass   3. Status post foot surgery      Plan:  Patient was evaluated and treated and all questions answered.  S/p foot surgery right -Clinically healed.  No further dehiscence noted.  At this time if any foot and ankle issues arise future of asked him to come see me.  He states understanding.  He  can return to regular shoes.  No follow-ups on file.

## 2021-01-29 ENCOUNTER — Other Ambulatory Visit: Payer: Self-pay | Admitting: Internal Medicine

## 2021-01-29 NOTE — Telephone Encounter (Signed)
Please refill as per office routine med refill policy (all routine meds to be refilled for 3 mo or monthly (per pt preference) up to one year from last visit, then month to month grace period for 3 mo, then further med refills will have to be denied) ? ?

## 2021-02-16 ENCOUNTER — Telehealth: Payer: Self-pay

## 2021-02-16 NOTE — Telephone Encounter (Signed)
Courtesy call made for follow up visit to be made.

## 2021-05-08 ENCOUNTER — Other Ambulatory Visit: Payer: Self-pay | Admitting: Internal Medicine

## 2021-05-08 NOTE — Telephone Encounter (Signed)
Please refill as per office routine med refill policy (all routine meds to be refilled for 3 mo or monthly (per pt preference) up to one year from last visit, then month to month grace period for 3 mo, then further med refills will have to be denied) ? ?

## 2021-05-22 ENCOUNTER — Ambulatory Visit: Payer: PPO

## 2021-05-31 ENCOUNTER — Other Ambulatory Visit: Payer: Self-pay | Admitting: Internal Medicine

## 2021-05-31 NOTE — Telephone Encounter (Signed)
Please refill as per office routine med refill policy (all routine meds to be refilled for 3 mo or monthly (per pt preference) up to one year from last visit, then month to month grace period for 3 mo, then further med refills will have to be denied) ? ?

## 2021-06-01 ENCOUNTER — Ambulatory Visit (INDEPENDENT_AMBULATORY_CARE_PROVIDER_SITE_OTHER): Payer: PPO

## 2021-06-01 DIAGNOSIS — Z0000 Encounter for general adult medical examination without abnormal findings: Secondary | ICD-10-CM

## 2021-06-01 NOTE — Progress Notes (Signed)
I connected with [name] today by telephone and verified that I am speaking with the correct person using two identifiers. Location patient: home Location provider: work Persons participating in the virtual visit: patient, provider.   I discussed the limitations, risks, security and privacy concerns of performing an evaluation and management service by telephone and the availability of in person appointments. I also discussed with the patient that there may be a patient responsible charge related to this service. The patient expressed understanding and verbally consented to this telephonic visit.    Interactive audio and video telecommunications were attempted between this provider and patient, however failed, due to patient having technical difficulties OR patient did not have access to video capability.  We continued and completed visit with audio only.  Some vital signs may be absent or patient reported.   Time Spent with patient on telephone encounter: 40 minutes  Subjective:   Roger Alvarado is a 71 y.o. male who presents for Medicare Annual/Subsequent preventive examination.  Review of Systems     Cardiac Risk Factors include: advanced age (>35men, >46 women);diabetes mellitus;dyslipidemia;family history of premature cardiovascular disease;hypertension;male gender     Objective:    There were no vitals filed for this visit. There is no height or weight on file to calculate BMI.  Advanced Directives 06/01/2021 05/21/2020 03/24/2017 08/20/2016  Does Patient Have a Medical Advance Directive? No No No No  Would patient like information on creating a medical advance directive? No - Patient declined No - Patient declined No - Patient declined -    Current Medications (verified) Outpatient Encounter Medications as of 06/01/2021  Medication Sig   aspirin 81 MG tablet Take 81 mg by mouth daily.   colchicine 0.6 MG tablet TAKE 2 TABLETS BY MOUTH AT ONSET OF FLARE AND THEN 1  TABLET 1 HOUR LATER   ibuprofen (ADVIL) 800 MG tablet Take 1 tablet (800 mg total) by mouth every 6 (six) hours as needed.   lisinopril (ZESTRIL) 10 MG tablet TAKE 1 TABLET BY MOUTH EVERY DAY   metFORMIN (GLUCOPHAGE) 1000 MG tablet Take 1 tablet (1,000 mg total) by mouth 2 (two) times daily.   pravastatin (PRAVACHOL) 80 MG tablet TAKE 1 TABLET BY MOUTH EVERY DAY   vitamin B-12 (CYANOCOBALAMIN) 1000 MCG tablet Take 1 tablet (1,000 mcg total) by mouth daily.   ibuprofen (ADVIL) 800 MG tablet Take 1 tablet (800 mg total) by mouth every 6 (six) hours as needed. (Patient not taking: Reported on 08/15/2020)   oxyCODONE-acetaminophen (PERCOCET) 5-325 MG tablet Take 1 tablet by mouth every 4 (four) hours as needed for severe pain. (Patient not taking: Reported on 06/01/2021)   Vitamin D, Ergocalciferol, (DRISDOL) 1.25 MG (50000 UNIT) CAPS capsule Take 1 capsule (50,000 Units total) by mouth every 7 (seven) days. (Patient not taking: Reported on 06/01/2021)   No facility-administered encounter medications on file as of 06/01/2021.    Allergies (verified) Patient has no known allergies.   History: Past Medical History:  Diagnosis Date   Allergy    Arthritis    Asthma    Childhood   Diabetes mellitus    Diverticulitis    Diverticulosis    Gout    Gout    Hyperlipidemia    Obesity, Class III, BMI 40-49.9 (morbid obesity) ([place])    History reviewed. No pertinent surgical history. Family History  Problem Relation Age of Onset   Diabetes Brother    Hypertension Brother    Diabetes Brother    Hypertension  Brother    Diabetes Father    Hypertension Father    Diabetes Sister    Asthma Mother    Heart attack Maternal Grandfather    Social History   Socioeconomic History   Marital status: Married    Spouse name: Not on file   Number of children: 2   Years of education: 18   Highest education level: Not on file  Occupational History   Occupation: Retired  Tobacco Use   Smoking status:  Never   Smokeless tobacco: Never  Vaping Use   Vaping Use: Never used  Substance and Sexual Activity   Alcohol use: Yes    Alcohol/week: 2.0 standard drinks    Types: 2 Cans of beer per week   Drug use: No   Sexual activity: Not on file  Other Topics Concern   Not on file  Social History Narrative   Fun: Watch sports, gardening    Social Determinants of Health   Financial Resource Strain: Low Risk    Difficulty of Paying Living Expenses: Not hard at all  Food Insecurity: No Food Insecurity   Worried About Charity fundraiser in the Last Year: Never true   Arboriculturist in the Last Year: Never true  Transportation Needs: No Transportation Needs   Lack of Transportation (Medical): No   Lack of Transportation (Non-Medical): No  Physical Activity: Sufficiently Active   Days of Exercise per Week: 5 days   Minutes of Exercise per Session: 30 min  Stress: No Stress Concern Present   Feeling of Stress : Not at all  Social Connections: Socially Integrated   Frequency of Communication with Friends and Family: More than three times a week   Frequency of Social Gatherings with Friends and Family: Once a week   Attends Religious Services: 1 to 4 times per year   Active Member of [employer] or Organizations: Yes   Attends Archivist Meetings: 1 to 4 times per year   Marital Status: Married    Tobacco Counseling Counseling given: Not Answered   Clinical Intake:  Pre-visit preparation completed: Yes  Pain : No/denies pain     Nutritional Risks: None Diabetes: No  How often do you need to have someone help you when you read instructions, pamphlets, or other written materials from your doctor or pharmacy?: 1 - Never What is the last grade level you completed in school?: Master's Degree  Diabetic? no  Interpreter Needed?: No  Information entered by :: Lisette Abu, LPN   Activities of Daily Living In your present state of health, do you have any difficulty  performing the following activities: 06/01/2021  Hearing? N  Vision? N  Difficulty concentrating or making decisions? N  Walking or climbing stairs? N  Dressing or bathing? N  Doing errands, shopping? N  Preparing Food and eating ? N  Using the Toilet? N  In the past six months, have you accidently leaked urine? N  Do you have problems with loss of bowel control? N  Managing your Medications? N  Managing your Finances? N  Housekeeping or managing your Housekeeping? N  Some recent data might be hidden    Patient Care Team: Biagio Borg, MD as PCP - General (Internal Medicine) Himmelrich, Bryson Ha, RD (Inactive) as Dietitian Opthamology, [facility] as Consulting Physician (Ophthalmology)  Indicate any recent Medical Services you may have received from other than [facility] providers in the past year (date may be approximate).     Assessment:  This is a routine wellness examination for Roger Alvarado.  Hearing/Vision screen Hearing Screening - Comments:: Patient denied any hearing difficulty.   No hearing aids.  Vision Screening - Comments:: Patient wears readers.   Eye exam done annually by: [facility]  Dietary issues and exercise activities discussed: Current Exercise Habits: Home exercise routine, Type of exercise: walking, Time (Minutes): 30, Frequency (Times/Week): 5, Weekly Exercise (Minutes/Week): 150, Intensity: Moderate, Exercise limited by: None identified   Goals Addressed             This Visit's Progress    Patient Stated       My goal is to increase my level of walking and drink more water.      Depression Screen PHQ 2/9 Scores 06/01/2021 08/15/2020 05/21/2020 08/07/2019 08/07/2019 08/24/2018 09/03/2016  PHQ - 2 Score 0 1 0 0 0 0 0    Fall Risk Fall Risk  06/01/2021 08/15/2020 05/21/2020 08/07/2019 08/07/2019  Falls in the past year? 0 0 0 0 0  Number falls in past yr: 0 0 0 - 0  Injury with Fall? 0 0 0 - 0  Risk for fall due to : No Fall Risks - No Fall Risks -  No Fall Risks  Follow up Falls evaluation completed - - - Falls evaluation completed    FALL RISK PREVENTION PERTAINING TO THE HOME:  Any stairs in or around the home? No  If so, are there any without handrails? No  Home free of loose throw rugs in walkways, pet beds, electrical cords, etc? Yes  Adequate lighting in your home to reduce risk of falls? Yes   ASSISTIVE DEVICES UTILIZED TO PREVENT FALLS:  Life alert? No  Use of a cane, walker or w/c? No  Grab bars in the bathroom? Yes  Shower chair or bench in shower? No  Elevated toilet seat or a handicapped toilet? No   TIMED UP AND GO:  Was the test performed? No .  Length of time to ambulate 10 feet: n/a sec.   Gait steady and fast without use of assistive device  Cognitive Function: Normal cognitive status assessed by direct observation by this Nurse Health Advisor. No abnormalities found.          Immunizations Immunization History  Administered Date(s) Administered   Fluad Quad(high Dose 65+) 12/13/2018   Influenza, High Dose Seasonal PF 12/29/2016, 01/03/2018   Influenza-Unspecified 01/17/2020   PFIZER(Purple Top)SARS-COV-2 Vaccination 04/23/2019, 05/18/2019, 01/17/2020   Pneumococcal Conjugate-13 08/28/2015   Pneumococcal Polysaccharide-23 09/03/2016   Tdap 08/28/2015    TDAP status: Up to date  Flu Vaccine status: Up to date  Pneumococcal vaccine status: Up to date  Covid-19 vaccine status: Completed vaccines  Qualifies for Shingles Vaccine? Yes   Zostavax completed No   Shingrix Completed?: No.    Education has been provided regarding the importance of this vaccine. Patient has been advised to call insurance company to determine out of pocket expense if they have not yet received this vaccine. Advised may also receive vaccine at local pharmacy or Health Dept. Verbalized acceptance and understanding.  Screening Tests Health Maintenance  Topic Date Due   Zoster Vaccines- Shingrix (1 of 2) Never done    OPHTHALMOLOGY EXAM  02/27/2020   INFLUENZA VACCINE  11/03/2020   HEMOGLOBIN A1C  02/15/2021   FOOT EXAM  08/15/2021   TETANUS/TDAP  08/27/2025   COLONOSCOPY (Pts 45-64yrs Insurance coverage will need to be confirmed)  06/02/2026   Pneumonia Vaccine 19+ Years old  Completed  COVID-19 Vaccine  Completed   Hepatitis C Screening  Completed   HPV VACCINES  Aged Out    Health Maintenance  Health Maintenance Due  Topic Date Due   Zoster Vaccines- Shingrix (1 of 2) Never done   OPHTHALMOLOGY EXAM  02/27/2020   INFLUENZA VACCINE  11/03/2020   HEMOGLOBIN A1C  02/15/2021    Colorectal cancer screening: Type of screening: Colonoscopy. Completed 06/02/2016. Repeat every 10 years  Lung Cancer Screening: (Low Dose CT Chest recommended if Age 60-80 years, 30 pack-year currently smoking OR have quit w/in 15years.) does not qualify.   Lung Cancer Screening Referral: no  Additional Screening:  Hepatitis C Screening: does qualify; Completed yes  Vision Screening: Recommended annual ophthalmology exams for early detection of glaucoma and other disorders of the eye. Is the patient up to date with their annual eye exam?  Yes  Who is the provider or what is the name of the office in which the patient attends annual eye exams? [facility] If pt is not established with a provider, would they like to be referred to a provider to establish care? No .   Dental Screening: Recommended annual dental exams for proper oral hygiene  Community Resource Referral / Chronic Care Management: CRR required this visit?  No   CCM required this visit?  No      Plan:     I have personally reviewed and noted the following in the patients chart:   Medical and social history Use of alcohol, tobacco or illicit drugs  Current medications and supplements including opioid prescriptions. Patient is not currently taking opioid prescriptions. Functional ability and status Nutritional status Physical  activity Advanced directives List of other physicians Hospitalizations, surgeries, and ER visits in previous 12 months Vitals Screenings to include cognitive, depression, and falls Referrals and appointments  In addition, I have reviewed and discussed with patient certain preventive protocols, quality metrics, and best practice recommendations. A written personalized care plan for preventive services as well as general preventive health recommendations were provided to patient.     Sheral Flow, LPN   [id number]   Nurse Notes:  Patient is cogitatively intact. There were no vitals filed for this visit. There is no height or weight on file to calculate BMI. Patient stated that he has no issues with gait or balance; does not use any assistive devices.

## 2021-06-01 NOTE — Patient Instructions (Signed)
[name] , Thank you for taking time to come for your Medicare Wellness Visit. I appreciate your ongoing commitment to your health goals. Please review the following plan we discussed and let me know if I can assist you in the future.   Screening recommendations/referrals: Colonoscopy: 06/02/2016; due every 10 years (due 06/02/2026) Recommended yearly ophthalmology/optometry visit for glaucoma screening and checkup Recommended yearly dental visit for hygiene and checkup  Vaccinations: Influenza vaccine: 12/31/2020 Pneumococcal vaccine: 08/30/2015, 09/03/2016 Tdap vaccine: 08/28/2015; due every 10 years (due 08/27/2025) Shingles vaccine: never done   Covid-19: 04/23/2019, 05/18/2019, 01/17/2020, 12/31/2020  Advanced directives: Advance directive discussed with you today. Even though you declined this today please call our office should you change your mind and we can give you the proper paperwork for you to fill out.  Conditions/risks identified: Yes; Client understands the importance of follow-up appointments with providers by attending scheduled visits and discussed goals to eat healthier, increase physical activity 5 times a week for 30 minutes each, exercise the brain by doing stimulating brain exercises (reading, adult coloring, crafting, listening to music, puzzles, etc.), socialize and enjoy life more, get enough sleep at least 8-9 hours average per night and make time for laughter.  Next appointment: Please schedule your next Medicare Wellness Visit with your Nurse Health Advisor in 1 year by calling [phone].  Preventive Care 40 Years and Older, Male Preventive care refers to lifestyle choices and visits with your health care provider that can promote health and wellness. What does preventive care include? A yearly physical exam. This is also called an annual well check. Dental exams once or twice a year. Routine eye exams. Ask your health care provider how often you should have your  eyes checked. Personal lifestyle choices, including: Daily care of your teeth and gums. Regular physical activity. Eating a healthy diet. Avoiding tobacco and drug use. Limiting alcohol use. Practicing safe sex. Taking low doses of aspirin every day. Taking vitamin and mineral supplements as recommended by your health care provider. What happens during an annual well check? The services and screenings done by your health care provider during your annual well check will depend on your age, overall health, lifestyle risk factors, and family history of disease. Counseling  Your health care provider may ask you questions about your: Alcohol use. Tobacco use. Drug use. Emotional well-being. Home and relationship well-being. Sexual activity. Eating habits. History of falls. Memory and ability to understand (cognition). Work and work Statistician. Screening  You may have the following tests or measurements: Height, weight, and BMI. Blood pressure. Lipid and cholesterol levels. These may be checked every 5 years, or more frequently if you are over 62 years old. Skin check. Lung cancer screening. You may have this screening every year starting at age 57 if you have a 30-pack-year history of smoking and currently smoke or have quit within the past 15 years. Fecal occult blood test (FOBT) of the stool. You may have this test every year starting at age 44. Flexible sigmoidoscopy or colonoscopy. You may have a sigmoidoscopy every 5 years or a colonoscopy every 10 years starting at age 38. Prostate cancer screening. Recommendations will vary depending on your family history and other risks. Hepatitis C blood test. Hepatitis B blood test. Sexually transmitted disease (STD) testing. Diabetes screening. This is done by checking your blood sugar (glucose) after you have not eaten for a while (fasting). You may have this done every 1-3 years. Abdominal aortic aneurysm (AAA) screening. You may need  this if you are a current or former smoker. Osteoporosis. You may be screened starting at age 47 if you are at high risk. Talk with your health care provider about your test results, treatment options, and if necessary, the need for more tests. Vaccines  Your health care provider may recommend certain vaccines, such as: Influenza vaccine. This is recommended every year. Tetanus, diphtheria, and acellular pertussis (Tdap, Td) vaccine. You may need a Td booster every 10 years. Zoster vaccine. You may need this after age 100. Pneumococcal 13-valent conjugate (PCV13) vaccine. One dose is recommended after age 33. Pneumococcal polysaccharide (PPSV23) vaccine. One dose is recommended after age 33. Talk to your health care provider about which screenings and vaccines you need and how often you need them. This information is not intended to replace advice given to you by your health care provider. Make sure you discuss any questions you have with your health care provider. Document Released: 04/18/2015 Document Revised: 12/10/2015 Document Reviewed: 01/21/2015 Elsevier Interactive Patient Education  2017 [place] Prevention in the Home Falls can cause injuries. They can happen to people of all ages. There are many things you can do to make your home safe and to help prevent falls. What can I do on the outside of my home? Regularly fix the edges of walkways and driveways and fix any cracks. Remove anything that might make you trip as you walk through a door, such as a raised step or threshold. Trim any bushes or trees on the path to your home. Use bright outdoor lighting. Clear any walking paths of anything that might make someone trip, such as rocks or tools. Regularly check to see if handrails are loose or broken. Make sure that both sides of any steps have handrails. Any raised decks and porches should have guardrails on the edges. Have any leaves, snow, or ice cleared regularly. Use  sand or salt on walking paths during winter. Clean up any spills in your garage right away. This includes oil or grease spills. What can I do in the bathroom? Use night lights. Install grab bars by the toilet and in the tub and shower. Do not use towel bars as grab bars. Use non-skid mats or decals in the tub or shower. If you need to sit down in the shower, use a plastic, non-slip stool. Keep the floor dry. Clean up any water that spills on the floor as soon as it happens. Remove soap buildup in the tub or shower regularly. Attach bath mats securely with double-sided non-slip rug tape. Do not have throw rugs and other things on the floor that can make you trip. What can I do in the bedroom? Use night lights. Make sure that you have a light by your bed that is easy to reach. Do not use any sheets or blankets that are too big for your bed. They should not hang down onto the floor. Have a firm chair that has side arms. You can use this for support while you get dressed. Do not have throw rugs and other things on the floor that can make you trip. What can I do in the kitchen? Clean up any spills right away. Avoid walking on wet floors. Keep items that you use a lot in easy-to-reach places. If you need to reach something above you, use a strong step stool that has a grab bar. Keep electrical cords out of the way. Do not use floor polish or wax that makes floors slippery.  If you must use wax, use non-skid floor wax. Do not have throw rugs and other things on the floor that can make you trip. What can I do with my stairs? Do not leave any items on the stairs. Make sure that there are handrails on both sides of the stairs and use them. Fix handrails that are broken or loose. Make sure that handrails are as long as the stairways. Check any carpeting to make sure that it is firmly attached to the stairs. Fix any carpet that is loose or worn. Avoid having throw rugs at the top or bottom of the  stairs. If you do have throw rugs, attach them to the floor with carpet tape. Make sure that you have a light switch at the top of the stairs and the bottom of the stairs. If you do not have them, ask someone to add them for you. What else can I do to help prevent falls? Wear shoes that: Do not have high heels. Have rubber bottoms. Are comfortable and fit you well. Are closed at the toe. Do not wear sandals. If you use a stepladder: Make sure that it is fully opened. Do not climb a closed stepladder. Make sure that both sides of the stepladder are locked into place. Ask someone to hold it for you, if possible. Clearly mark and make sure that you can see: Any grab bars or handrails. First and last steps. Where the edge of each step is. Use tools that help you move around (mobility aids) if they are needed. These include: Canes. Walkers. Scooters. Crutches. Turn on the lights when you go into a dark area. Replace any light bulbs as soon as they burn out. Set up your furniture so you have a clear path. Avoid moving your furniture around. If any of your floors are uneven, fix them. If there are any pets around you, be aware of where they are. Review your medicines with your doctor. Some medicines can make you feel dizzy. This can increase your chance of falling. Ask your doctor what other things that you can do to help prevent falls. This information is not intended to replace advice given to you by your health care provider. Make sure you discuss any questions you have with your health care provider. Document Released: 01/16/2009 Document Revised: 08/28/2015 Document Reviewed: 04/26/2014 Elsevier Interactive Patient Education  2017 [employer].

## 2021-06-03 ENCOUNTER — Other Ambulatory Visit: Payer: Self-pay

## 2021-06-03 ENCOUNTER — Ambulatory Visit (INDEPENDENT_AMBULATORY_CARE_PROVIDER_SITE_OTHER): Payer: PPO | Admitting: Internal Medicine

## 2021-06-03 ENCOUNTER — Encounter: Payer: Self-pay | Admitting: Internal Medicine

## 2021-06-03 VITALS — BP 120/60 | HR 90 | Temp 98.1°F | Ht 69.0 in | Wt 275.8 lb

## 2021-06-03 DIAGNOSIS — E7800 Pure hypercholesterolemia, unspecified: Secondary | ICD-10-CM | POA: Diagnosis not present

## 2021-06-03 DIAGNOSIS — R350 Frequency of micturition: Secondary | ICD-10-CM

## 2021-06-03 DIAGNOSIS — N1831 Chronic kidney disease, stage 3a: Secondary | ICD-10-CM | POA: Diagnosis not present

## 2021-06-03 DIAGNOSIS — E559 Vitamin D deficiency, unspecified: Secondary | ICD-10-CM | POA: Diagnosis not present

## 2021-06-03 DIAGNOSIS — E538 Deficiency of other specified B group vitamins: Secondary | ICD-10-CM

## 2021-06-03 DIAGNOSIS — E1165 Type 2 diabetes mellitus with hyperglycemia: Secondary | ICD-10-CM | POA: Diagnosis not present

## 2021-06-03 DIAGNOSIS — Z0001 Encounter for general adult medical examination with abnormal findings: Secondary | ICD-10-CM

## 2021-06-03 LAB — URINALYSIS, ROUTINE W REFLEX MICROSCOPIC
Bilirubin Urine: NEGATIVE
Ketones, ur: NEGATIVE
Nitrite: POSITIVE — AB
Specific Gravity, Urine: 1.015 (ref 1.000–1.030)
Total Protein, Urine: 30 — AB
Urine Glucose: NEGATIVE
Urobilinogen, UA: 0.2 (ref 0.0–1.0)
pH: 5.5 (ref 5.0–8.0)

## 2021-06-03 LAB — HEPATIC FUNCTION PANEL
ALT: 32 U/L (ref 0–53)
AST: 28 U/L (ref 0–37)
Albumin: 4 g/dL (ref 3.5–5.2)
Alkaline Phosphatase: 66 U/L (ref 39–117)
Bilirubin, Direct: 0.2 mg/dL (ref 0.0–0.3)
Total Bilirubin: 1 mg/dL (ref 0.2–1.2)
Total Protein: 7 g/dL (ref 6.0–8.3)

## 2021-06-03 LAB — LIPID PANEL
Cholesterol: 158 mg/dL (ref 0–200)
HDL: 42.6 mg/dL (ref >39.00)
LDL Cholesterol: 95 mg/dL (ref 0–99)
NonHDL: 115.55
Total CHOL/HDL Ratio: 4
Triglycerides: 103 mg/dL (ref 0.0–149.0)
VLDL: 20.6 mg/dL (ref 0.0–40.0)

## 2021-06-03 LAB — CBC WITH DIFFERENTIAL/PLATELET
Basophils Absolute: 0 10*3/uL (ref 0.0–0.1)
Basophils Relative: 0.3 % (ref 0.0–3.0)
Eosinophils Absolute: 0.3 10*3/uL (ref 0.0–0.7)
Eosinophils Relative: 4.1 % (ref 0.0–5.0)
HCT: 43.6 % (ref 39.0–52.0)
Hemoglobin: 14.2 g/dL (ref 13.0–17.0)
Lymphocytes Relative: 25.5 % (ref 12.0–46.0)
Lymphs Abs: 1.9 10*3/uL (ref 0.7–4.0)
MCHC: 32.6 g/dL (ref 30.0–36.0)
MCV: 92.2 fl (ref 78.0–100.0)
Monocytes Absolute: 0.9 10*3/uL (ref 0.1–1.0)
Monocytes Relative: 11.8 % (ref 3.0–12.0)
Neutro Abs: 4.4 10*3/uL (ref 1.4–7.7)
Neutrophils Relative %: 58.3 % (ref 43.0–77.0)
Platelets: 213 10*3/uL (ref 150.0–400.0)
RBC: 4.73 Mil/uL (ref 4.22–5.81)
RDW: 13.7 % (ref 11.5–15.5)
WBC: 7.5 10*3/uL (ref 4.0–10.5)

## 2021-06-03 LAB — MICROALBUMIN / CREATININE URINE RATIO
Creatinine,U: 0 mg/dL
Microalb Creat Ratio: 2333.3 mg/g — ABNORMAL HIGH (ref 0.0–30.0)
Microalb, Ur: <0.7 mg/dL (ref 0.0–1.9)

## 2021-06-03 LAB — BASIC METABOLIC PANEL
BUN: 38 mg/dL — ABNORMAL HIGH (ref 6–23)
CO2: 24 mEq/L (ref 19–32)
Calcium: 9.6 mg/dL (ref 8.4–10.5)
Chloride: 104 mEq/L (ref 96–112)
Creatinine, Ser: 2.11 mg/dL — ABNORMAL HIGH (ref 0.40–1.50)
GFR: 30.88 mL/min — ABNORMAL LOW (ref >60.00)
Glucose, Bld: 119 mg/dL — ABNORMAL HIGH (ref 70–99)
Potassium: 4.8 mEq/L (ref 3.5–5.1)
Sodium: 137 mEq/L (ref 135–145)

## 2021-06-03 LAB — HEMOGLOBIN A1C: Hgb A1c MFr Bld: 6.3 % (ref 4.6–6.5)

## 2021-06-03 LAB — PSA: PSA: 17.93 ng/mL — ABNORMAL HIGH (ref 0.10–4.00)

## 2021-06-03 LAB — TSH: TSH: 1.74 u[IU]/mL (ref 0.35–5.50)

## 2021-06-03 LAB — VITAMIN D 25 HYDROXY (VIT D DEFICIENCY, FRACTURES): VITD: 23.37 ng/mL — ABNORMAL LOW (ref 30.00–100.00)

## 2021-06-03 LAB — VITAMIN B12: Vitamin B-12: 245 pg/mL (ref 211–911)

## 2021-06-03 MED ORDER — CIPROFLOXACIN HCL 500 MG PO TABS: 500.0000 mg | ORAL_TABLET | Freq: Two times a day (BID) | ORAL | 0 refills | Status: AC

## 2021-06-03 NOTE — Progress Notes (Signed)
Patient ID: Roger Alvarado, male   DOB: 02-Jul-1950, 71 y.o.   MRN: [id number] ? ? ? ?     Chief Complaint:: wellness exam and Office Visit (Frequent urination since friday, tenderness in lower abdomen) ? , b12 deficiency, ckd, dm, vit d deficiency ? ?     HPI:  Roger Alvarado is a 71 y.o. male here for wellness exam, plans to call himself for eye exam, declines shingrix; o/w up to date ?         ?              Also Denies urinary symptoms such as dysuria, frequency, urgency, flank pain, hematuria or n/v, fever, chills, but has 2-3 days onset frequency and lower abd discomfort similar to previous UTI, just trying not to wait too long this time to start antibx.  Pt denies chest pain, increased sob or doe, wheezing, orthopnea, PND, increased LE swelling, palpitations, dizziness or syncope.   Pt denies polydipsia, polyuria, or new focal neuro s/s.    Pt denies fever, wt loss, night sweats, loss of appetite, or other constitutional symptoms   ?  ?Wt Readings from Last 3 Encounters:  ?06/03/21 275 lb 12.8 oz (125.1 kg)  ?08/15/20 290 lb (131.5 kg)  ?05/21/20 (!) 306 lb (138.8 kg)  ? ?BP Readings from Last 3 Encounters:  ?06/03/21 120/60  ?08/15/20 122/72  ?05/21/20 136/70  ? ?Immunization History  ?Administered Date(s) Administered  ? Fluad Quad(high Dose 65+) 12/13/2018  ? Influenza, High Dose Seasonal PF 12/29/2016, 01/03/2018  ? Influenza-Unspecified 01/17/2020, 12/31/2020  ? PFIZER(Purple Top)SARS-COV-2 Vaccination 04/23/2019, 05/18/2019, 01/17/2020  ? Pension scheme manager 75yrs & up 12/31/2020  ? Pneumococcal Conjugate-13 08/28/2015  ? Pneumococcal Polysaccharide-23 09/03/2016  ? Tdap 08/28/2015  ? ?Health Maintenance Due  ?Topic Date Due  ? OPHTHALMOLOGY EXAM  02/27/2020  ? ?  ? ?Past Medical History:  ?Diagnosis Date  ? Allergy   ? Arthritis   ? Asthma   ? Childhood  ? Diabetes mellitus   ? Diverticulitis   ? Diverticulosis   ? Gout   ? Gout   ? Hyperlipidemia   ? Obesity, Class III, BMI  40-49.9 (morbid obesity) ([place])   ? ?History reviewed. No pertinent surgical history. ? reports that he has never smoked. He has never used smokeless tobacco. He reports current alcohol use of about 2.0 standard drinks per week. He reports that he does not use drugs. ?family history includes Asthma in his mother; Diabetes in his brother, brother, father, and sister; Heart attack in his maternal grandfather; Hypertension in his brother, brother, and father. ?No Known Allergies ?Current Outpatient Medications on File Prior to Visit  ?Medication Sig Dispense Refill  ? aspirin 81 MG tablet Take 81 mg by mouth daily.    ? colchicine 0.6 MG tablet TAKE 2 TABLETS BY MOUTH AT ONSET OF FLARE AND THEN 1 TABLET 1 HOUR LATER 180 tablet 1  ? ibuprofen (ADVIL) 800 MG tablet Take 1 tablet (800 mg total) by mouth every 6 (six) hours as needed. 60 tablet 1  ? lisinopril (ZESTRIL) 10 MG tablet TAKE 1 TABLET BY MOUTH EVERY DAY 90 tablet 3  ? metFORMIN (GLUCOPHAGE) 1000 MG tablet Take 1 tablet (1,000 mg total) by mouth 2 (two) times daily. 180 tablet 3  ? pravastatin (PRAVACHOL) 80 MG tablet TAKE 1 TABLET BY MOUTH EVERY DAY 30 tablet 0  ? vitamin B-12 (CYANOCOBALAMIN) 1000 MCG tablet Take 1 tablet (1,000 mcg total) by  mouth daily. 90 tablet 3  ? oxyCODONE-acetaminophen (PERCOCET) 5-325 MG tablet Take 1 tablet by mouth every 4 (four) hours as needed for severe pain. (Patient not taking: Reported on 06/01/2021) 30 tablet 0  ? Vitamin D, Ergocalciferol, (DRISDOL) 1.25 MG (50000 UNIT) CAPS capsule Take 1 capsule (50,000 Units total) by mouth every 7 (seven) days. (Patient not taking: Reported on 06/01/2021) 12 capsule 0  ? ?No current facility-administered medications on file prior to visit.  ? ?     ROS:  All others reviewed and negative. ? ?Objective  ? ?     PE:  BP 120/60 (BP Location: Right Arm, Patient Position: Sitting, Cuff Size: Large)   Pulse 90   Temp 98.1 ?F (36.7 ?C) (Oral)   Ht 5\' 9"  (1.753 m)   Wt 275 lb 12.8 oz (125.1 kg)    SpO2 98%   BMI 40.73 kg/m?  ? ?              Constitutional: Pt appears in NAD ?              HENT: Head: NCAT.  ?              Right Ear: External ear normal.   ?              Left Ear: External ear normal.  ?              Eyes: . Pupils are equal, round, and reactive to light. Conjunctivae and EOM are normal ?              Nose: without d/c or deformity ?              Neck: Neck supple. Gross normal ROM ?              Cardiovascular: Normal rate and regular rhythm.   ?              Pulmonary/Chest: Effort normal and breath sounds without rales or wheezing.  ?              Abd:  Soft, mild low mid abd tender, ND, + BS, no organomegaly, no guarding, no rebound or flank tender ?              Neurological: Pt is alert. At baseline orientation, motor grossly intact ?              Skin: Skin is warm. No rashes, no other new lesions, LE edema - none ?              Psychiatric: Pt behavior is normal without agitation  ? ?Micro: none ? ?Cardiac tracings I have personally interpreted today:  none ? ?Pertinent Radiological findings (summarize): none  ? ?Lab Results  ?Component Value Date  ? WBC 7.5 06/03/2021  ? HGB 14.2 06/03/2021  ? HCT 43.6 06/03/2021  ? PLT 213.0 06/03/2021  ? GLUCOSE 119 (H) 06/03/2021  ? CHOL 158 06/03/2021  ? TRIG 103.0 06/03/2021  ? HDL 42.60 06/03/2021  ? [place] 95 06/03/2021  ? ALT 32 06/03/2021  ? AST 28 06/03/2021  ? NA 137 06/03/2021  ? K 4.8 06/03/2021  ? CL 104 06/03/2021  ? CREATININE 2.11 (H) 06/03/2021  ? BUN 38 (H) 06/03/2021  ? CO2 24 06/03/2021  ? TSH 1.74 06/03/2021  ? PSA 17.93 (H) 06/03/2021  ? INR 1.1 (H) 08/15/2020  ? HGBA1C 6.3 06/03/2021  ? MICROALBUR <0.7 06/03/2021  ? ?  Assessment/Plan:  ?Roger Alvarado is a 71 y.o. Black or African American [2] male with  has a past medical history of Allergy, Arthritis, Asthma, Diabetes mellitus, Diverticulitis, Diverticulosis, Gout, Gout, Hyperlipidemia, and Obesity, Class III, BMI 40-49.9 (morbid obesity) ([place]). ? ?Encounter for well  adult exam with abnormal findings ?Age and sex appropriate education and counseling updated with regular exercise and diet ?Referrals for preventative services - none needed ?Immunizations addressed - declines shingrix ?Smoking counseling  - none needed ?Evidence for depression or other mood disorder - none significant ?Most recent labs reviewed. ?I have personally reviewed and have noted: ?1) the patient's medical and social history ?2) The patient's current medications and supplements ?3) The patient's height, weight, and BMI have been recorded in the chart ? ? ?B12 deficiency ?Lab Results  ?Component Value Date  ? [id number] 245 06/03/2021  ? ?Low, to start oral replacement - b12 1000 mcg qd ? ?CKD (chronic kidney disease) stage 3, GFR 30-59 ml/min ([facility]) ?Lab Results  ?Component Value Date  ? CREATININE 2.11 (H) 06/03/2021  ? ?Stable overall, cont to avoid nephrotoxins ? ? ?Type 2 diabetes mellitus ([place]) ?Lab Results  ?Component Value Date  ? HGBA1C 6.3 06/03/2021  ? ?Stable, pt to continue current medical treatment metformin ? ? ?Hyperlipidemia ?Lab Results  ?Component Value Date  ? [place] 95 06/03/2021  ? ?Uncontrolled, goal ldl < 70,  pt to continue current statin pravachol 80 as declines change ? ? ?Urinary frequency ?With exam with high suspicion for uti, Mild to mod, for antibx course after urine studies,  to f/u any worsening symptoms or concerns ? ?Vitamin D deficiency ?Last vitamin D ?Lab Results  ?Component Value Date  ? VD25OH 23.37 (L) 06/03/2021  ? ?Ow, to start oral replacement ? ?Followup: Return in about 6 months (around 12/04/2021). ? ?Cathlean Cower, MD 06/05/2021 4:46 AM ? [facility] ?[place] ?Internal Medicine ?

## 2021-06-03 NOTE — Patient Instructions (Signed)
Please take all new medication as prescribed - the antibiotic ? ?Please make sure to see your Eye doctor soon ? ?Please continue all other medications as before, and refills have been done if requested. ? ?Please have the pharmacy call with any other refills you may need. ? ?Please continue your efforts at being more active, low cholesterol diet, and weight control. ? ?You are otherwise up to date with prevention measures today. ? ?Please keep your appointments with your specialists as you may have planned ? ?Please go to the LAB at the blood drawing area for the tests to be done ? ?You will be contacted by phone if any changes need to be made immediately.  Otherwise, you will receive a letter about your results with an explanation, but please check with MyChart first. ? ?Please remember to sign up for MyChart if you have not done so, as this will be important to you in the future with finding out test results, communicating by private email, and scheduling acute appointments online when needed. ? ?Please make an Appointment to return in 6 months, or sooner if needed ?

## 2021-06-05 ENCOUNTER — Other Ambulatory Visit: Payer: Self-pay | Admitting: Internal Medicine

## 2021-06-05 ENCOUNTER — Encounter: Payer: Self-pay | Admitting: Internal Medicine

## 2021-06-05 LAB — URINE CULTURE

## 2021-06-05 MED ORDER — SULFAMETHOXAZOLE-TRIMETHOPRIM 800-160 MG PO TABS: 1.0000 | ORAL_TABLET | Freq: Two times a day (BID) | ORAL | 0 refills | Status: DC

## 2021-06-05 NOTE — Assessment & Plan Note (Signed)
Lab Results  ?Component Value Date  ? [place] 95 06/03/2021  ? ?Uncontrolled, goal ldl < 70,  pt to continue current statin pravachol 80 as declines change ? ?

## 2021-06-05 NOTE — Assessment & Plan Note (Signed)
Lab Results  ?Component Value Date  ? [id number] 245 06/03/2021  ? ?Low, to start oral replacement - b12 1000 mcg qd ?

## 2021-06-05 NOTE — Assessment & Plan Note (Signed)
Last vitamin D ?Lab Results  ?Component Value Date  ? VD25OH 23.37 (L) 06/03/2021  ? ?Ow, to start oral replacement ? ?

## 2021-06-05 NOTE — Assessment & Plan Note (Signed)
With exam with high suspicion for uti, Mild to mod, for antibx course after urine studies,  to f/u any worsening symptoms or concerns ?

## 2021-06-05 NOTE — Assessment & Plan Note (Signed)
Lab Results  ?Component Value Date  ? HGBA1C 6.3 06/03/2021  ? ?Stable, pt to continue current medical treatment metformin ? ?

## 2021-06-05 NOTE — Assessment & Plan Note (Signed)
Lab Results  ?Component Value Date  ? CREATININE 2.11 (H) 06/03/2021  ? ?Stable overall, cont to avoid nephrotoxins ? ?

## 2021-06-05 NOTE — Assessment & Plan Note (Signed)

## 2021-06-25 ENCOUNTER — Other Ambulatory Visit: Payer: Self-pay | Admitting: Internal Medicine

## 2021-06-26 NOTE — Telephone Encounter (Signed)
Please refill as per office routine med refill policy (all routine meds to be refilled for 3 mo or monthly (per pt preference) up to one year from last visit, then month to month grace period for 3 mo, then further med refills will have to be denied) ? ?

## 2021-07-17 IMAGING — US US ABDOMEN LIMITED RUQ/ASCITES
1 series · 14 of 25 positions shown · non-contrast
Comparison: None.

CLINICAL DATA: Epigastric pain.

EXAM:
ULTRASOUND ABDOMEN LIMITED RIGHT UPPER QUADRANT

[Series 1: us abdomen limited ruq/ascites · 0.19mm/px · 14 of 37 slices shown]
[im 1/37]
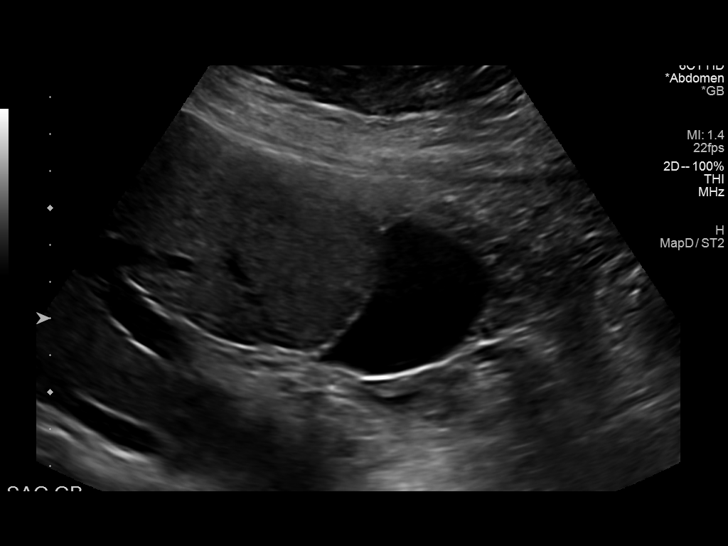
[im 4/37]
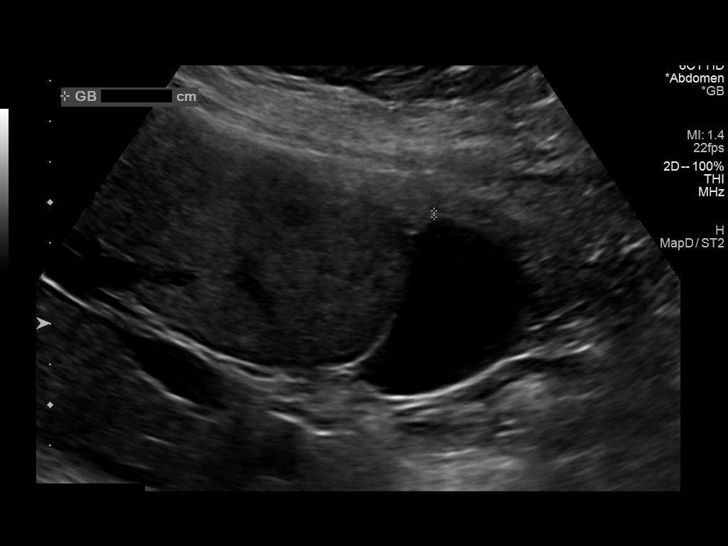
[im 7/37]
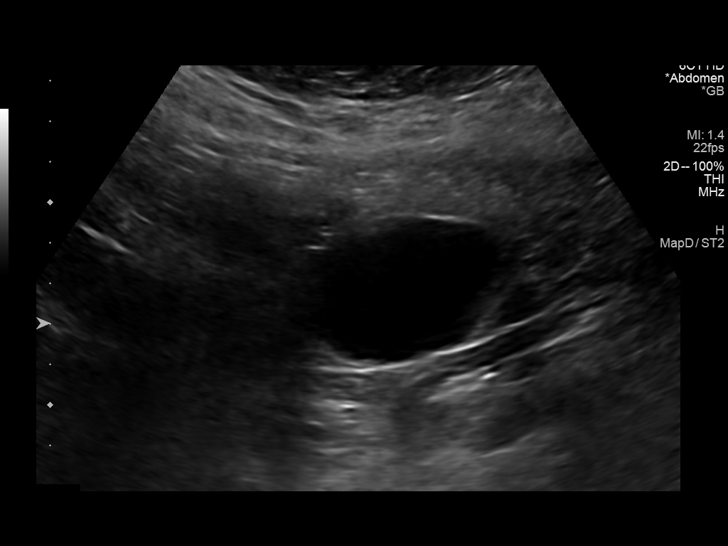
[im 10/37]
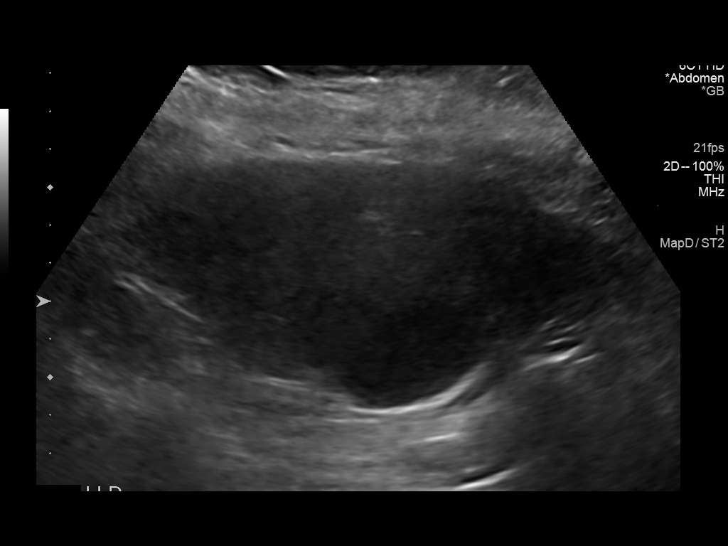
[im 13/37]
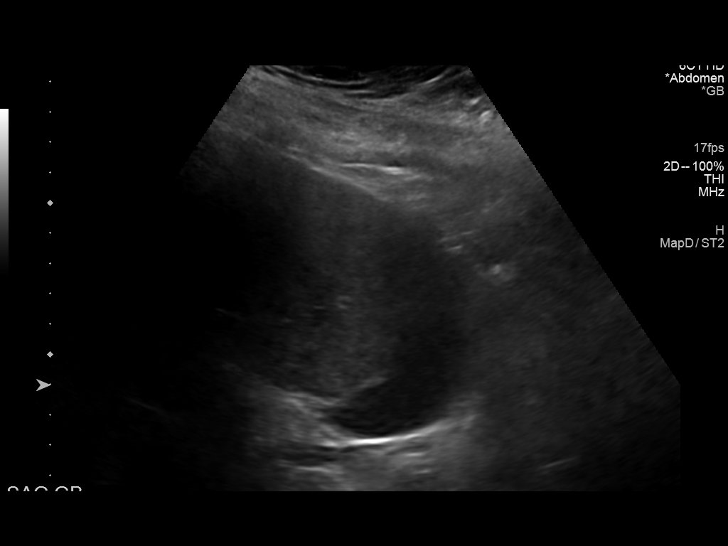
[im 14/37]
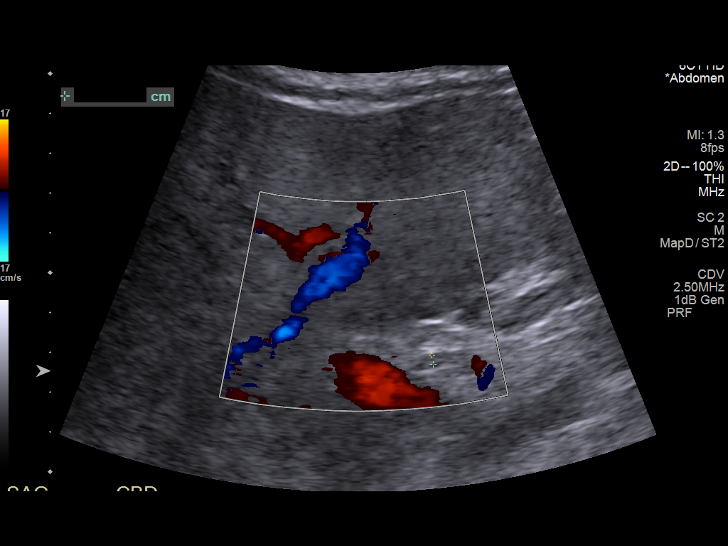
[im 17/37]
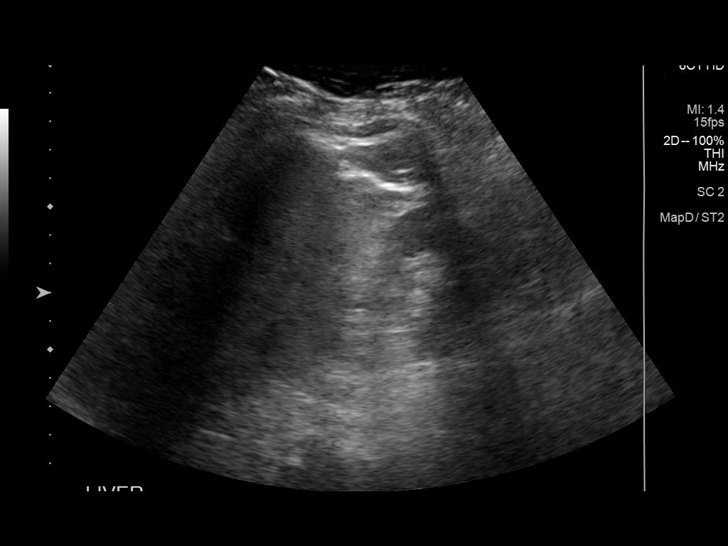
[im 20/37]
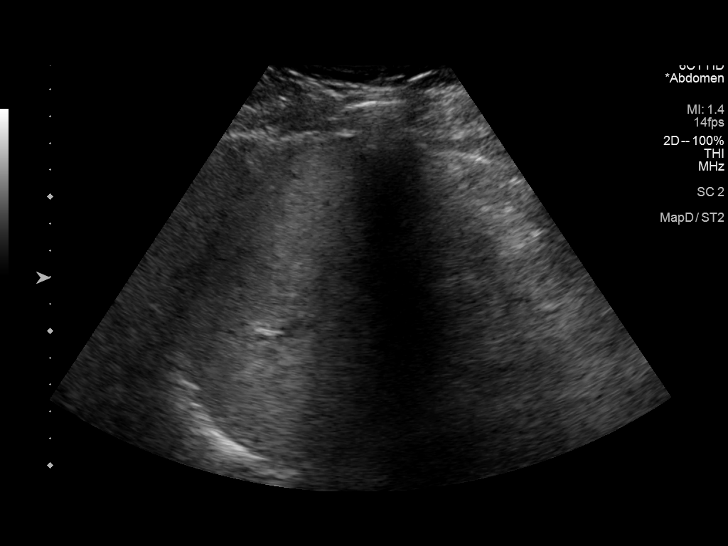
[im 23/37]
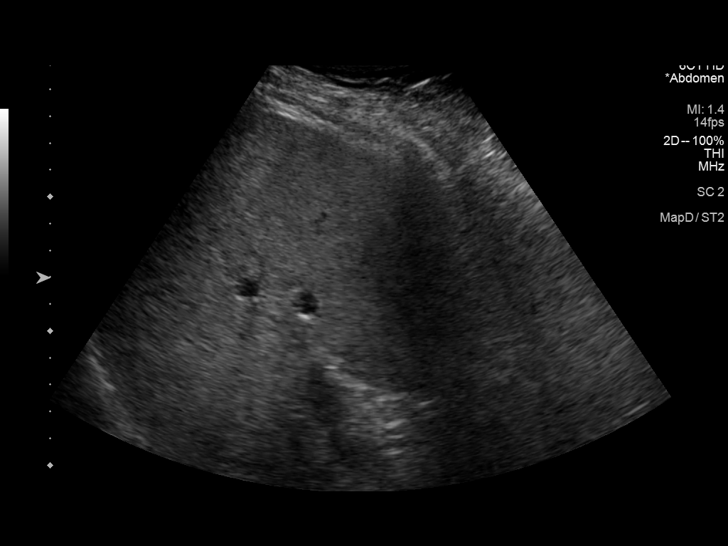
[im 25/37]
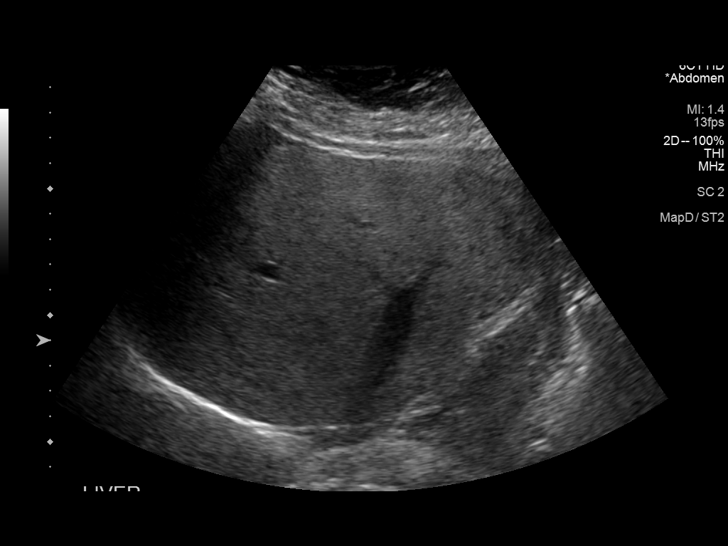
[im 28/37]
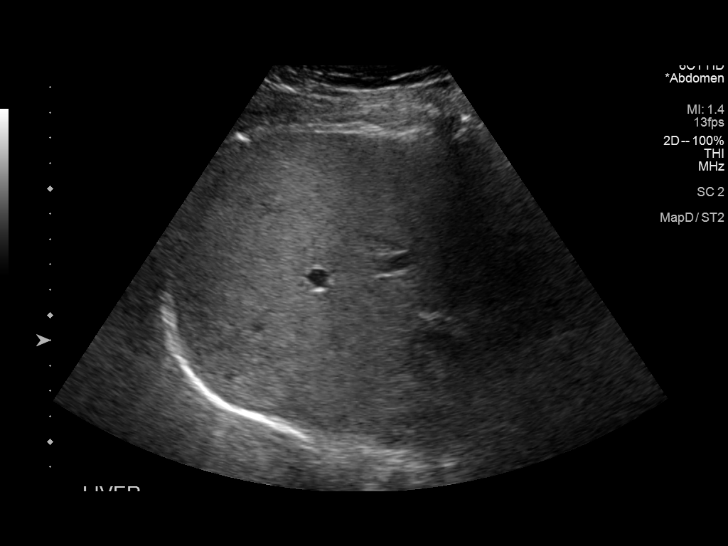
[im 31/37]
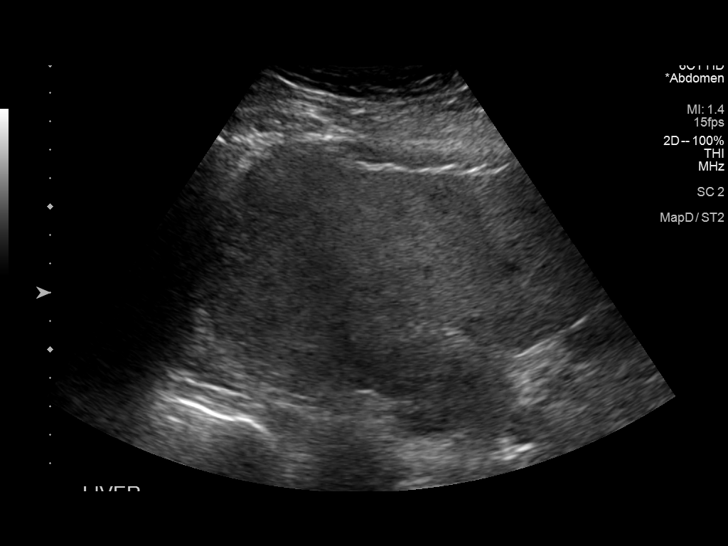
[im 34/37]
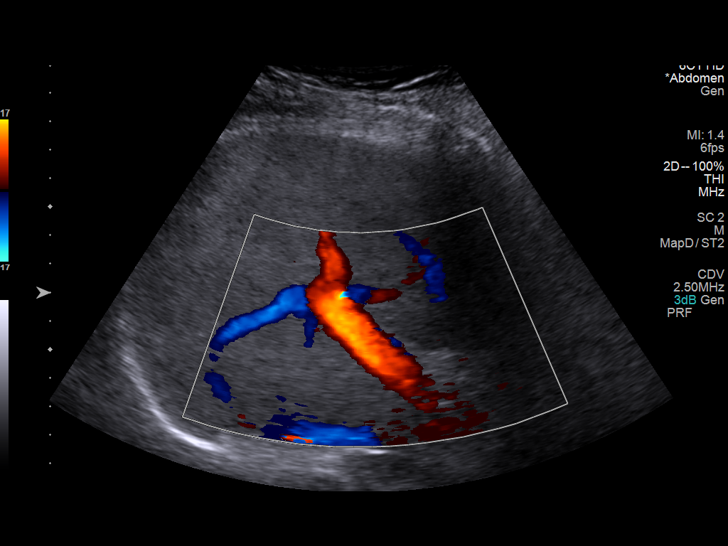
[im 37/37]
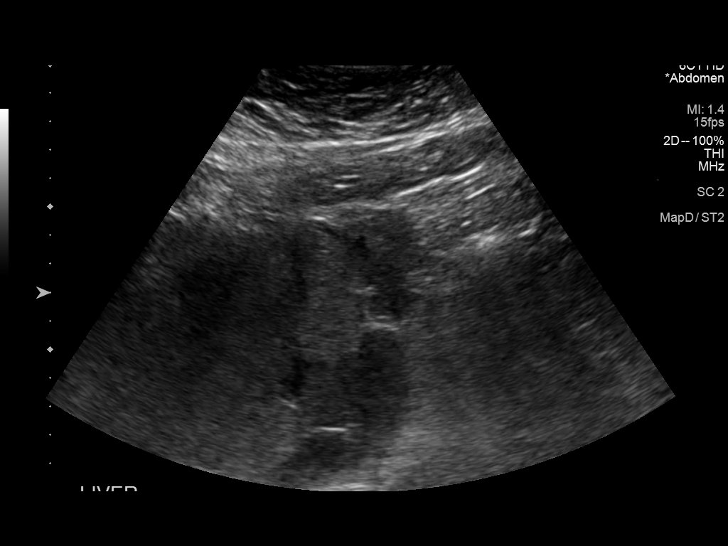

[14 of 25 positions shown; findings below may reference images not displayed]

FINDINGS: Gallbladder:

No gallstones or wall thickening visualized (1.6 mm). No sonographic
Murphy sign noted by sonographer.

Common bile duct:

Diameter: 2.3 mm

Liver:

No focal lesion identified. There is diffusely increased
echogenicity of the liver parenchyma which is also coarse in
echotexture. Portal vein is patent on color Doppler imaging with
normal direction of blood flow towards the liver.

Other: None.
IMPRESSION: Fatty, cirrhotic liver.

## 2021-08-27 ENCOUNTER — Other Ambulatory Visit: Payer: Self-pay | Admitting: Internal Medicine

## 2021-08-27 NOTE — Telephone Encounter (Signed)
Please refill as per office routine med refill policy (all routine meds to be refilled for 3 mo or monthly (per pt preference) up to one year from last visit, then month to month grace period for 3 mo, then further med refills will have to be denied) ? ?

## 2021-09-24 ENCOUNTER — Ambulatory Visit (INDEPENDENT_AMBULATORY_CARE_PROVIDER_SITE_OTHER): Payer: PPO | Admitting: Internal Medicine

## 2021-09-24 ENCOUNTER — Encounter: Payer: Self-pay | Admitting: Internal Medicine

## 2021-09-24 VITALS — BP 142/74 | HR 89 | Temp 98.7°F | Ht 69.0 in | Wt 265.0 lb

## 2021-09-24 DIAGNOSIS — R300 Dysuria: Secondary | ICD-10-CM

## 2021-09-24 DIAGNOSIS — N1831 Chronic kidney disease, stage 3a: Secondary | ICD-10-CM

## 2021-09-24 DIAGNOSIS — I10 Essential (primary) hypertension: Secondary | ICD-10-CM | POA: Diagnosis not present

## 2021-09-24 DIAGNOSIS — E559 Vitamin D deficiency, unspecified: Secondary | ICD-10-CM

## 2021-09-24 DIAGNOSIS — E1165 Type 2 diabetes mellitus with hyperglycemia: Secondary | ICD-10-CM | POA: Diagnosis not present

## 2021-09-24 LAB — URINALYSIS, ROUTINE W REFLEX MICROSCOPIC
Bilirubin Urine: NEGATIVE
Nitrite: POSITIVE — AB
Specific Gravity, Urine: 1.02 (ref 1.000–1.030)
Total Protein, Urine: 30 — AB
Urine Glucose: NEGATIVE
Urobilinogen, UA: 0.2 (ref 0.0–1.0)
pH: 5.5 (ref 5.0–8.0)

## 2021-09-24 MED ORDER — SULFAMETHOXAZOLE-TRIMETHOPRIM 800-160 MG PO TABS: 1.0000 | ORAL_TABLET | Freq: Two times a day (BID) | ORAL | 0 refills | Status: DC

## 2021-09-24 NOTE — Progress Notes (Unsigned)
Patient ID: Roger Alvarado, male   DOB: 02-12-1951, 71 y.o.   MRN: [id number]

## 2021-09-24 NOTE — Patient Instructions (Signed)
Please take all new medication as prescribed - the antibiotic  Please continue all other medications as before, and refills have been done if requested.  Please have the pharmacy call with any other refills you may need.  Please keep your appointments with your specialists as you may have planned  Please go to the LAB at the blood drawing area for the tests to be done - just the urine testing today  You will be contacted by phone if any changes need to be made immediately.  Otherwise, you will receive a letter about your results with an explanation, but please check with MyChart first.  Please remember to sign up for MyChart if you have not done so, as this will be important to you in the future with finding out test results, communicating by private email, and scheduling acute appointments online when needed.   

## 2021-09-26 LAB — URINE CULTURE

## 2021-09-27 ENCOUNTER — Encounter: Payer: Self-pay | Admitting: Internal Medicine

## 2021-09-27 DIAGNOSIS — R300 Dysuria: Secondary | ICD-10-CM | POA: Insufficient documentation

## 2021-09-27 DIAGNOSIS — R8290 Unspecified abnormal findings in urine: Secondary | ICD-10-CM | POA: Insufficient documentation

## 2021-09-27 DIAGNOSIS — I10 Essential (primary) hypertension: Secondary | ICD-10-CM | POA: Insufficient documentation

## 2021-09-27 NOTE — Assessment & Plan Note (Signed)
Lab Results  Component Value Date   HGBA1C 6.3 06/03/2021   Stable, pt to continue current medical treatment metformin 1000 mg bid

## 2021-10-12 ENCOUNTER — Other Ambulatory Visit: Payer: Self-pay | Admitting: Internal Medicine

## 2021-10-12 NOTE — Telephone Encounter (Signed)
Please refill as per office routine med refill policy (all routine meds to be refilled for 3 mo or monthly (per pt preference) up to one year from last visit, then month to month grace period for 3 mo, then further med refills will have to be denied) ? ?

## 2021-12-09 ENCOUNTER — Other Ambulatory Visit: Payer: Self-pay | Admitting: Internal Medicine

## 2021-12-09 ENCOUNTER — Ambulatory Visit (INDEPENDENT_AMBULATORY_CARE_PROVIDER_SITE_OTHER): Payer: PPO | Admitting: Internal Medicine

## 2021-12-09 VITALS — BP 120/74 | HR 79 | Temp 98.3°F | Ht 69.0 in | Wt 264.0 lb

## 2021-12-09 DIAGNOSIS — E1165 Type 2 diabetes mellitus with hyperglycemia: Secondary | ICD-10-CM

## 2021-12-09 DIAGNOSIS — E559 Vitamin D deficiency, unspecified: Secondary | ICD-10-CM | POA: Diagnosis not present

## 2021-12-09 DIAGNOSIS — N1831 Chronic kidney disease, stage 3a: Secondary | ICD-10-CM | POA: Diagnosis not present

## 2021-12-09 DIAGNOSIS — E538 Deficiency of other specified B group vitamins: Secondary | ICD-10-CM | POA: Diagnosis not present

## 2021-12-09 DIAGNOSIS — E7800 Pure hypercholesterolemia, unspecified: Secondary | ICD-10-CM

## 2021-12-09 DIAGNOSIS — I10 Essential (primary) hypertension: Secondary | ICD-10-CM

## 2021-12-09 DIAGNOSIS — E875 Hyperkalemia: Secondary | ICD-10-CM

## 2021-12-09 LAB — BASIC METABOLIC PANEL
BUN: 25 mg/dL — ABNORMAL HIGH (ref 6–23)
CO2: 23 mEq/L (ref 19–32)
Calcium: 10.1 mg/dL (ref 8.4–10.5)
Chloride: 109 mEq/L (ref 96–112)
Creatinine, Ser: 1.37 mg/dL (ref 0.40–1.50)
GFR: 51.66 mL/min — ABNORMAL LOW (ref >60.00)
Glucose, Bld: 92 mg/dL (ref 70–99)
Potassium: 6 mEq/L — ABNORMAL HIGH (ref 3.5–5.1)
Sodium: 140 mEq/L (ref 135–145)

## 2021-12-09 LAB — LIPID PANEL
Cholesterol: 163 mg/dL (ref 0–200)
HDL: 44.2 mg/dL (ref >39.00)
LDL Cholesterol: 89 mg/dL (ref 0–99)
NonHDL: 118.51
Total CHOL/HDL Ratio: 4
Triglycerides: 149 mg/dL (ref 0.0–149.0)
VLDL: 29.8 mg/dL (ref 0.0–40.0)

## 2021-12-09 LAB — HEPATIC FUNCTION PANEL
ALT: 20 U/L (ref 0–53)
AST: 21 U/L (ref 0–37)
Albumin: 4.3 g/dL (ref 3.5–5.2)
Alkaline Phosphatase: 62 U/L (ref 39–117)
Bilirubin, Direct: 0.2 mg/dL (ref 0.0–0.3)
Total Bilirubin: 1 mg/dL (ref 0.2–1.2)
Total Protein: 7.8 g/dL (ref 6.0–8.3)

## 2021-12-09 LAB — HEMOGLOBIN A1C: Hgb A1c MFr Bld: 6.3 % (ref 4.6–6.5)

## 2021-12-09 LAB — VITAMIN D 25 HYDROXY (VIT D DEFICIENCY, FRACTURES): VITD: 18.9 ng/mL — ABNORMAL LOW (ref 30.00–100.00)

## 2021-12-09 LAB — VITAMIN B12: Vitamin B-12: 231 pg/mL (ref 211–911)

## 2021-12-09 MED ORDER — LISINOPRIL 10 MG PO TABS: 10.0000 mg | ORAL_TABLET | Freq: Every day | ORAL | 3 refills | Status: DC

## 2021-12-09 MED ORDER — ROSUVASTATIN CALCIUM 40 MG PO TABS: 40.0000 mg | ORAL_TABLET | Freq: Every day | ORAL | 3 refills | Status: DC

## 2021-12-09 NOTE — Progress Notes (Signed)
Patient ID: DWAYNE BULKLEY, male   DOB: 1950-12-22, 71 y.o.   MRN: [id number]        Chief Complaint: follow up HTN, HLD and DM, ckd       HPI:  SHARROD ACHILLE is a 71 y.o. male here overall doing ok, Pt denies chest pain, increased sob or doe, wheezing, orthopnea, PND, increased LE swelling, palpitations, dizziness or syncope.   Pt denies polydipsia, polyuria, or new focal neuro s/s.    Pt denies fever, night sweats, loss of appetite, or other constitutional symptom  Trying to follow lower chol diet.  Denies urinary symptoms such as dysuria, frequency, urgency, flank pain, hematuria or n/v, fever, chills - the UTI appears to have been resolved mar 2023.   Lost 10 lbs since mar 2023,  but overall 40 lbs over 2 yrs.  Walking 3 times per wk with sweating, feeling much better last 3 mo. .   Wt Readings from Last 3 Encounters:  12/09/21 264 lb (119.7 kg)  09/24/21 265 lb (120.2 kg)  06/03/21 275 lb 12.8 oz (125.1 kg)   BP Readings from Last 3 Encounters:  12/09/21 120/74  09/24/21 (!) 142/74  06/03/21 120/60         Past Medical History:  Diagnosis Date   Allergy    Arthritis    Asthma    Childhood   Diabetes mellitus    Diverticulitis    Diverticulosis    Gout    Gout    Hyperlipidemia    Obesity, Class III, BMI 40-49.9 (morbid obesity) ([place])    History reviewed. No pertinent surgical history.  reports that he has never smoked. He has never used smokeless tobacco. He reports current alcohol use of about 2.0 standard drinks of alcohol per week. He reports that he does not use drugs. family history includes Asthma in his mother; Diabetes in his brother, brother, father, and sister; Heart attack in his maternal grandfather; Hypertension in his brother, brother, and father. No Known Allergies Current Outpatient Medications on File Prior to Visit  Medication Sig Dispense Refill   aspirin 81 MG tablet Take 81 mg by mouth daily.     colchicine 0.6 MG tablet TAKE 2 TABLETS BY MOUTH AT  ONSET OF FLARE AND THEN 1 TABLET 1 HOUR LATER 180 tablet 1   ibuprofen (ADVIL) 800 MG tablet Take 1 tablet (800 mg total) by mouth every 6 (six) hours as needed. 60 tablet 1   metFORMIN (GLUCOPHAGE) 1000 MG tablet TAKE 1 TABLET BY MOUTH TWICE A DAY 180 tablet 3   vitamin B-12 (CYANOCOBALAMIN) 1000 MCG tablet Take 1 tablet (1,000 mcg total) by mouth daily. 90 tablet 3   No current facility-administered medications on file prior to visit.        ROS:  All others reviewed and negative.  Objective        PE:  BP 120/74 (BP Location: Left Arm, Patient Position: Sitting, Cuff Size: Large)   Pulse 79   Temp 98.3 F (36.8 C) (Oral)   Ht '5\' 9"'$  (1.753 m)   Wt 264 lb (119.7 kg)   SpO2 94%   BMI 38.99 kg/m                 Constitutional: Pt appears in NAD               HENT: Head: NCAT.                Right Ear: External ear  normal.                 Left Ear: External ear normal.                Eyes: . Pupils are equal, round, and reactive to light. Conjunctivae and EOM are normal               Nose: without d/c or deformity               Neck: Neck supple. Gross normal ROM               Cardiovascular: Normal rate and regular rhythm.                 Pulmonary/Chest: Effort normal and breath sounds without rales or wheezing.                Abd:  Soft, NT, ND, + BS, no organomegaly               Neurological: Pt is alert. At baseline orientation, motor grossly intact               Skin: Skin is warm. No rashes, no other new lesions, LE edema - none               Psychiatric: Pt behavior is normal without agitation   Micro: none  Cardiac tracings I have personally interpreted today:  none  Pertinent Radiological findings (summarize): none   Lab Results  Component Value Date   WBC 7.5 06/03/2021   HGB 14.2 06/03/2021   HCT 43.6 06/03/2021   PLT 213.0 06/03/2021   GLUCOSE 128 (H) 12/10/2021   CHOL 163 12/09/2021   TRIG 149.0 12/09/2021   HDL 44.20 12/09/2021   LDLCALC 89 12/09/2021    ALT 20 12/09/2021   AST 21 12/09/2021   NA 139 12/10/2021   K 5.0 12/10/2021   CL 109 12/10/2021   CREATININE 1.39 12/10/2021   BUN 24 (H) 12/10/2021   CO2 22 12/10/2021   TSH 1.74 06/03/2021   PSA 17.93 (H) 06/03/2021   INR 1.1 (H) 08/15/2020   HGBA1C 6.3 12/09/2021   MICROALBUR <0.7 06/03/2021   Assessment/Plan:  GREOGRY GOODWYN is a 71 y.o. Black or African American [2] male with  has a past medical history of Allergy, Arthritis, Asthma, Diabetes mellitus, Diverticulitis, Diverticulosis, Gout, Gout, Hyperlipidemia, and Obesity, Class III, BMI 40-49.9 (morbid obesity) ([place]).  B12 deficiency Lab Results  Component Value Date   VITAMINB12 231 12/09/2021   Low to start oral replacement - b12 1000 mcg qd   CKD (chronic kidney disease) stage 3, GFR 30-59 ml/min (HCC) Lab Results  Component Value Date   CREATININE 1.39 12/10/2021   Stable overall, cont to avoid nephrotoxins   HTN (hypertension) BP Readings from Last 3 Encounters:  12/09/21 120/74  09/24/21 (!) 142/74  06/03/21 120/60   Stable, pt to continue medical treatment lisinopril 10 mg qd   Hyperlipidemia Lab Results  Component Value Date   LDLCALC 89 12/09/2021   Uncontrolled, goal ldl < 70,, pt to change praavachol to crestor 40 mg qd, lower chol diet   Type 2 diabetes mellitus ([place]) Lab Results  Component Value Date   HGBA1C 6.3 12/09/2021   Stable, pt to continue current medical treatment  - metformin 1000 bid   Vitamin D deficiency Last vitamin D Lab Results  Component Value Date   VD25OH 18.90 (L) 12/09/2021   Low, to start  oral replacement  Followup: Return in about 6 months (around 06/09/2022).  Cathlean Cower, MD 12/12/2021 2:04 PM [facility]

## 2021-12-09 NOTE — Patient Instructions (Addendum)
Ok to Change the pravastatin to the generic Crestor 40 mg per day  Please continue all other medications as before, and refills have been done if requested.  Please have the pharmacy call with any other refills you may need.  Please continue your efforts at being more active, low cholesterol diet, and weight control.  You are otherwise up to date with prevention measures today.  Please keep your appointments with your specialists as you may have planned  Please go to the LAB at the blood drawing area for the tests to be done  You will be contacted by phone if any changes need to be made immediately.  Otherwise, you will receive a letter about your results with an explanation, but please check with MyChart first.  Please remember to sign up for MyChart if you have not done so, as this will be important to you in the future with finding out test results, communicating by private email, and scheduling acute appointments online when needed.  Please make an Appointment to return in 6 months, or sooner if needed,

## 2021-12-10 ENCOUNTER — Other Ambulatory Visit (INDEPENDENT_AMBULATORY_CARE_PROVIDER_SITE_OTHER): Payer: PPO

## 2021-12-10 DIAGNOSIS — E875 Hyperkalemia: Secondary | ICD-10-CM | POA: Diagnosis not present

## 2021-12-10 LAB — BASIC METABOLIC PANEL
BUN: 24 mg/dL — ABNORMAL HIGH (ref 6–23)
CO2: 22 mEq/L (ref 19–32)
Calcium: 9.3 mg/dL (ref 8.4–10.5)
Chloride: 109 mEq/L (ref 96–112)
Creatinine, Ser: 1.39 mg/dL (ref 0.40–1.50)
GFR: 50.77 mL/min — ABNORMAL LOW (ref >60.00)
Glucose, Bld: 128 mg/dL — ABNORMAL HIGH (ref 70–99)
Potassium: 5 mEq/L (ref 3.5–5.1)
Sodium: 139 mEq/L (ref 135–145)

## 2021-12-12 ENCOUNTER — Encounter: Payer: Self-pay | Admitting: Internal Medicine

## 2021-12-12 NOTE — Assessment & Plan Note (Signed)
Lab Results  Component Value Date   LDLCALC 89 12/09/2021   Uncontrolled, goal ldl < 70,, pt to change praavachol to crestor 40 mg qd, lower chol diet

## 2021-12-12 NOTE — Assessment & Plan Note (Signed)
Lab Results  Component Value Date   HGBA1C 6.3 12/09/2021   Stable, pt to continue current medical treatment metformin 1000 bid  

## 2021-12-12 NOTE — Assessment & Plan Note (Signed)
Lab Results  Component Value Date   CREATININE 1.39 12/10/2021   Stable overall, cont to avoid nephrotoxins  

## 2021-12-12 NOTE — Assessment & Plan Note (Signed)
BP Readings from Last 3 Encounters:  12/09/21 120/74  09/24/21 (!) 142/74  06/03/21 120/60   Stable, pt to continue medical treatment lisinopril 10 mg qd

## 2021-12-12 NOTE — Assessment & Plan Note (Signed)
Lab Results  Component Value Date   VITAMINB12 231 12/09/2021   Low to start oral replacement - b12 1000 mcg qd

## 2021-12-12 NOTE — Assessment & Plan Note (Signed)
Last vitamin D Lab Results  Component Value Date   VD25OH 18.90 (L) 12/09/2021   Low, to start oral replacement  

## 2022-01-29 DIAGNOSIS — H524 Presbyopia: Secondary | ICD-10-CM | POA: Diagnosis not present

## 2022-01-29 DIAGNOSIS — E119 Type 2 diabetes mellitus without complications: Secondary | ICD-10-CM | POA: Diagnosis not present

## 2022-02-17 ENCOUNTER — Telehealth: Payer: Self-pay | Admitting: Internal Medicine

## 2022-02-17 ENCOUNTER — Other Ambulatory Visit: Payer: Self-pay | Admitting: Podiatry

## 2022-02-17 DIAGNOSIS — M109 Gout, unspecified: Secondary | ICD-10-CM

## 2022-02-17 NOTE — Telephone Encounter (Signed)
Caller & Relationship to patient: PATIENT  Pt reports he has a GOUT FLARE-UP IN [place]  Call Back Number: [phone]  Date of Last Office Visit: 12/09/21  Date of Next Office Visit: 06/08/21  Medication(s) to be Refilled: COLCHICINE 0.6  Preferred Pharmacy:  [address]   Pt says he had it refilled in 09/09/20.   ** Please notify patient to allow 48-72 hours to process** **Let patient know to contact pharmacy at the end of the day to make sure medication is ready. **

## 2022-02-18 MED ORDER — COLCHICINE 0.6 MG PO TABS: ORAL_TABLET | ORAL | 1 refills | Status: DC

## 2022-02-18 NOTE — Telephone Encounter (Signed)
Is refill appropriate for this medication?

## 2022-03-24 ENCOUNTER — Encounter: Payer: Self-pay | Admitting: Internal Medicine

## 2022-03-24 ENCOUNTER — Other Ambulatory Visit: Payer: PPO

## 2022-03-24 ENCOUNTER — Ambulatory Visit (INDEPENDENT_AMBULATORY_CARE_PROVIDER_SITE_OTHER): Payer: PPO | Admitting: Internal Medicine

## 2022-03-24 VITALS — BP 112/64 | HR 71 | Temp 97.7°F | Ht 69.0 in | Wt 257.1 lb

## 2022-03-24 DIAGNOSIS — E1165 Type 2 diabetes mellitus with hyperglycemia: Secondary | ICD-10-CM

## 2022-03-24 DIAGNOSIS — I10 Essential (primary) hypertension: Secondary | ICD-10-CM | POA: Diagnosis not present

## 2022-03-24 DIAGNOSIS — R350 Frequency of micturition: Secondary | ICD-10-CM

## 2022-03-24 LAB — URINALYSIS, ROUTINE W REFLEX MICROSCOPIC
Ketones, ur: NEGATIVE
Nitrite: NEGATIVE
Specific Gravity, Urine: >=1.03 — AB (ref 1.000–1.030)
Total Protein, Urine: 100 — AB
Urine Glucose: NEGATIVE
Urobilinogen, UA: 0.2 (ref 0.0–1.0)
pH: 5.5 (ref 5.0–8.0)

## 2022-03-24 LAB — POCT URINALYSIS DIPSTICK
Bilirubin, UA: NEGATIVE
Blood, UA: POSITIVE
Glucose, UA: NEGATIVE
Ketones, UA: NEGATIVE
Protein, UA: POSITIVE — AB
Spec Grav, UA: >=1.03 — AB (ref 1.010–1.025)
Urobilinogen, UA: 0.2 E.U./dL
pH, UA: 5.5 (ref 5.0–8.0)

## 2022-03-24 MED ORDER — SULFAMETHOXAZOLE-TRIMETHOPRIM 800-160 MG PO TABS: 1.0000 | ORAL_TABLET | Freq: Two times a day (BID) | ORAL | 0 refills | Status: DC

## 2022-03-24 NOTE — Patient Instructions (Signed)
Please take all new medication as prescribed - the antibiotic  Please continue all other medications as before  Please have the pharmacy call with any other refills you may need.  Please continue your efforts at being more active, low cholesterol diet, and weight control..  Please keep your appointments with your specialists as you may have planned  Your specimen will be sent to the lab  You will be contacted by phone if any changes need to be made immediately.  Otherwise, you will receive a letter about your results with an explanation, but please check with MyChart first.  Please remember to sign up for MyChart if you have not done so, as this will be important to you in the future with finding out test results, communicating by private email, and scheduling acute appointments online when needed.

## 2022-03-24 NOTE — Assessment & Plan Note (Signed)
Lab Results  Component Value Date   HGBA1C 6.3 12/09/2021   Stable, pt to continue current medical treatment metformin 1000 bid

## 2022-03-24 NOTE — Assessment & Plan Note (Signed)
BP Readings from Last 3 Encounters:  03/24/22 112/64  12/09/21 120/74  09/24/21 (!) 142/74   Stable, pt to continue medical treatment lisinopril 10 mg qd

## 2022-03-24 NOTE — Progress Notes (Addendum)
Patient ID: Roger Alvarado, male   DOB: [birth year], 71 y.o.   MRN: [id number]        Chief Complaint: follow up possible UTI with hx of June 2023 Uti with ESBL E coli       HPI:  Roger Alvarado is a 71 y.o. male here with c/o 2-3 days onset mild but similar symptoms to previous UTI - not so much dysuria, but urinary frequency, small amounts, and cloudy urine.  Denies urinary symptoms such as urgency, flank pain, hematuria or n/v, fever, chills.  Pt denies chest pain, increased sob or doe, wheezing, orthopnea, PND, increased LE swelling, palpitations, dizziness or syncope.   Pt denies polydipsia, polyuria, or new focal neuro s/s.         Wt Readings from Last 3 Encounters:  03/24/22 257 lb 2 oz (116.6 kg)  12/09/21 264 lb (119.7 kg)  09/24/21 265 lb (120.2 kg)   BP Readings from Last 3 Encounters:  03/24/22 112/64  12/09/21 120/74  09/24/21 (!) 142/74         Past Medical History:  Diagnosis Date   Allergy    Arthritis    Asthma    Childhood   Diabetes mellitus    Diverticulitis    Diverticulosis    Gout    Gout    Hyperlipidemia    Obesity, Class III, BMI 40-49.9 (morbid obesity) ([place])    History reviewed. No pertinent surgical history.  reports that he has never smoked. He has never used smokeless tobacco. He reports current alcohol use of about 2.0 standard drinks of alcohol per week. He reports that he does not use drugs. family history includes Asthma in his mother; Diabetes in his brother, brother, father, and sister; Heart attack in his maternal grandfather; Hypertension in his brother, brother, and father. No Known Allergies Current Outpatient Medications on File Prior to Visit  Medication Sig Dispense Refill   aspirin 81 MG tablet Take 81 mg by mouth daily.     colchicine 0.6 MG tablet TAKE 2 TABLETS BY MOUTH AT ONSET OF FLARE AND THEN 1 TABLET 1 HOUR LATER 180 tablet 1   lisinopril (ZESTRIL) 10 MG tablet Take 1 tablet (10 mg total) by mouth daily. 90 tablet 3    metFORMIN (GLUCOPHAGE) 1000 MG tablet TAKE 1 TABLET BY MOUTH TWICE A DAY 180 tablet 3   rosuvastatin (CRESTOR) 40 MG tablet Take 1 tablet (40 mg total) by mouth daily. 90 tablet 3   vitamin B-12 (CYANOCOBALAMIN) 1000 MCG tablet Take 1 tablet (1,000 mcg total) by mouth daily. 90 tablet 3   ibuprofen (ADVIL) 800 MG tablet Take 1 tablet (800 mg total) by mouth every 6 (six) hours as needed. 60 tablet 1   No current facility-administered medications on file prior to visit.        ROS:  All others reviewed and negative.  Objective        PE:  BP 112/64   Pulse 71   Temp 97.7 F (36.5 C) (Oral)   Ht '5\' 9"'$  (1.753 m)   Wt 257 lb 2 oz (116.6 kg)   SpO2 94%   BMI 37.97 kg/m                 Constitutional: Pt appears in NAD, mild ill appearing               HENT: Head: NCAT.  Right Ear: External ear normal.                 Left Ear: External ear normal.                Eyes: . Pupils are equal, round, and reactive to light. Conjunctivae and EOM are normal               Nose: without d/c or deformity               Neck: Neck supple. Gross normal ROM               Cardiovascular: Normal rate and regular rhythm.                 Pulmonary/Chest: Effort normal and breath sounds without rales or wheezing.                Abd:  Soft, NT, ND, + BS, no organomegaly               Neurological: Pt is alert. At baseline orientation, motor grossly intact               Skin: Skin is warm. No rashes, no other new lesions, LE edema - none               Psychiatric: Pt behavior is normal without agitation   Micro: none  Cardiac tracings I have personally interpreted today:  none  Pertinent Radiological findings (summarize): none   Lab Results  Component Value Date   WBC 7.5 06/03/2021   HGB 14.2 06/03/2021   HCT 43.6 06/03/2021   PLT 213.0 06/03/2021   GLUCOSE 128 (H) 12/10/2021   CHOL 163 12/09/2021   TRIG 149.0 12/09/2021   HDL 44.20 12/09/2021   LDLCALC 89 12/09/2021   ALT 20  12/09/2021   AST 21 12/09/2021   NA 139 12/10/2021   K 5.0 12/10/2021   CL 109 12/10/2021   CREATININE 1.39 12/10/2021   BUN 24 (H) 12/10/2021   CO2 22 12/10/2021   TSH 1.74 06/03/2021   PSA 17.93 (H) 06/03/2021   INR 1.1 (H) 08/15/2020   HGBA1C 6.3 12/09/2021   MICROALBUR <0.7 06/03/2021   Assessment/Plan:  Roger Alvarado is a 71 y.o. Black or African American [2] male with  has a past medical history of Allergy, Arthritis, Asthma, Diabetes mellitus, Diverticulitis, Diverticulosis, Gout, Gout, Hyperlipidemia, and Obesity, Class III, BMI 40-49.9 (morbid obesity) ([place]).  Urinary frequency With high suspicion for recurrent UTI, can't r/o recurrence ESBL E Coli - Udip is abnoraml,  will check urine culture, also tx with septra DS bid x 10 days,  to f/u any worsening symptoms or concerns  HTN (hypertension) BP Readings from Last 3 Encounters:  03/24/22 112/64  12/09/21 120/74  09/24/21 (!) 142/74   Stable, pt to continue medical treatment lisinopril 10 mg qd   Type 2 diabetes mellitus ([place]) Lab Results  Component Value Date   HGBA1C 6.3 12/09/2021   Stable, pt to continue current medical treatment metformin 1000 bid  Followup: Return if symptoms worsen or fail to improve.  Cathlean Cower, MD 03/24/2022 4:48 PM [facility]

## 2022-03-24 NOTE — Assessment & Plan Note (Addendum)
With high suspicion for recurrent UTI, can't r/o recurrence ESBL E Coli - Udip is abnoraml,  will check urine culture, also tx with septra DS bid x 10 days,  to f/u any worsening symptoms or concerns

## 2022-03-27 LAB — URINE CULTURE

## 2022-06-09 ENCOUNTER — Ambulatory Visit (INDEPENDENT_AMBULATORY_CARE_PROVIDER_SITE_OTHER): Payer: PPO | Admitting: Internal Medicine

## 2022-06-09 ENCOUNTER — Ambulatory Visit (INDEPENDENT_AMBULATORY_CARE_PROVIDER_SITE_OTHER): Payer: PPO

## 2022-06-09 VITALS — BP 130/70 | HR 65 | Temp 97.7°F | Ht 69.0 in | Wt 260.2 lb

## 2022-06-09 VITALS — BP 130/70 | HR 65 | Temp 98.0°F | Ht 69.0 in | Wt 260.1 lb

## 2022-06-09 DIAGNOSIS — E559 Vitamin D deficiency, unspecified: Secondary | ICD-10-CM | POA: Diagnosis not present

## 2022-06-09 DIAGNOSIS — Z0001 Encounter for general adult medical examination with abnormal findings: Secondary | ICD-10-CM | POA: Diagnosis not present

## 2022-06-09 DIAGNOSIS — E538 Deficiency of other specified B group vitamins: Secondary | ICD-10-CM | POA: Diagnosis not present

## 2022-06-09 DIAGNOSIS — Z0000 Encounter for general adult medical examination without abnormal findings: Secondary | ICD-10-CM | POA: Diagnosis not present

## 2022-06-09 DIAGNOSIS — N1831 Chronic kidney disease, stage 3a: Secondary | ICD-10-CM | POA: Diagnosis not present

## 2022-06-09 DIAGNOSIS — E1165 Type 2 diabetes mellitus with hyperglycemia: Secondary | ICD-10-CM | POA: Diagnosis not present

## 2022-06-09 DIAGNOSIS — E7800 Pure hypercholesterolemia, unspecified: Secondary | ICD-10-CM | POA: Diagnosis not present

## 2022-06-09 DIAGNOSIS — I10 Essential (primary) hypertension: Secondary | ICD-10-CM | POA: Diagnosis not present

## 2022-06-09 DIAGNOSIS — Z125 Encounter for screening for malignant neoplasm of prostate: Secondary | ICD-10-CM | POA: Diagnosis not present

## 2022-06-09 LAB — CBC WITH DIFFERENTIAL/PLATELET
Basophils Absolute: 0.1 10*3/uL (ref 0.0–0.1)
Basophils Relative: 1.5 % (ref 0.0–3.0)
Eosinophils Absolute: 0.2 10*3/uL (ref 0.0–0.7)
Eosinophils Relative: 2.5 % (ref 0.0–5.0)
HCT: 43.9 % (ref 39.0–52.0)
Hemoglobin: 14.6 g/dL (ref 13.0–17.0)
Lymphocytes Relative: 47 % — ABNORMAL HIGH (ref 12.0–46.0)
Lymphs Abs: 3.6 10*3/uL (ref 0.7–4.0)
MCHC: 33.3 g/dL (ref 30.0–36.0)
MCV: 92.1 fl (ref 78.0–100.0)
Monocytes Absolute: 0.7 10*3/uL (ref 0.1–1.0)
Monocytes Relative: 8.6 % (ref 3.0–12.0)
Neutro Abs: 3.1 10*3/uL (ref 1.4–7.7)
Neutrophils Relative %: 40.4 % — ABNORMAL LOW (ref 43.0–77.0)
Platelets: 192 10*3/uL (ref 150.0–400.0)
RBC: 4.77 Mil/uL (ref 4.22–5.81)
RDW: 14.9 % (ref 11.5–15.5)
WBC: 7.6 10*3/uL (ref 4.0–10.5)

## 2022-06-09 LAB — LIPID PANEL
Cholesterol: 111 mg/dL (ref 0–200)
HDL: 48.1 mg/dL (ref >39.00)
LDL Cholesterol: 40 mg/dL (ref 0–99)
NonHDL: 63.03
Total CHOL/HDL Ratio: 2
Triglycerides: 115 mg/dL (ref 0.0–149.0)
VLDL: 23 mg/dL (ref 0.0–40.0)

## 2022-06-09 LAB — HEPATIC FUNCTION PANEL
ALT: 25 U/L (ref 0–53)
AST: 34 U/L (ref 0–37)
Albumin: 4.1 g/dL (ref 3.5–5.2)
Alkaline Phosphatase: 65 U/L (ref 39–117)
Bilirubin, Direct: 0.2 mg/dL (ref 0.0–0.3)
Total Bilirubin: 0.7 mg/dL (ref 0.2–1.2)
Total Protein: 6.8 g/dL (ref 6.0–8.3)

## 2022-06-09 LAB — BASIC METABOLIC PANEL
BUN: 22 mg/dL (ref 6–23)
CO2: 24 mEq/L (ref 19–32)
Calcium: 9.8 mg/dL (ref 8.4–10.5)
Chloride: 106 mEq/L (ref 96–112)
Creatinine, Ser: 1.3 mg/dL (ref 0.40–1.50)
GFR: 54.83 mL/min — ABNORMAL LOW (ref >60.00)
Glucose, Bld: 72 mg/dL (ref 70–99)
Potassium: 4.7 mEq/L (ref 3.5–5.1)
Sodium: 140 mEq/L (ref 135–145)

## 2022-06-09 LAB — MICROALBUMIN / CREATININE URINE RATIO
Creatinine,U: 129.7 mg/dL
Microalb Creat Ratio: 10.8 mg/g (ref 0.0–30.0)
Microalb, Ur: 14.1 mg/dL — ABNORMAL HIGH (ref 0.0–1.9)

## 2022-06-09 LAB — VITAMIN B12: Vitamin B-12: 164 pg/mL — ABNORMAL LOW (ref 211–911)

## 2022-06-09 LAB — VITAMIN D 25 HYDROXY (VIT D DEFICIENCY, FRACTURES): VITD: 16.35 ng/mL — ABNORMAL LOW (ref 30.00–100.00)

## 2022-06-09 LAB — PSA: PSA: 0.27 ng/mL (ref 0.10–4.00)

## 2022-06-09 LAB — TSH: TSH: 1.19 u[IU]/mL (ref 0.35–5.50)

## 2022-06-09 MED ORDER — TIRZEPATIDE 2.5 MG/0.5ML ~~LOC~~ SOAJ: 2.5000 mg | SUBCUTANEOUS | 3 refills | Status: DC

## 2022-06-09 NOTE — Progress Notes (Unsigned)
Chief Complaint:: wellness exam and DM, low B12, ckd, htn, hld, low vit d       HPI:  Roger Alvarado is a 72 y.o. male here for wellness exam; for shingrix at pharmacy, declines covid booster, o/w up to date                        Also s/p UTI x 2 in the past few months.  Denies urinary symptoms such as dysuria, frequency, urgency, flank pain, hematuria or n/v, fever, chills. Pt denies chest pain, increased sob or doe, wheezing, orthopnea, PND, increased LE swelling, palpitations, dizziness or syncope.   Pt denies polydipsia, polyuria, or new focal neuro s/s.    Pt denies fever, wt loss, night sweats, loss of appetite, or other constitutional symptoms   Wt down 30 lbs from 2 yrs ago with diet and exercise, but now plateued.  Willing to start mounjaro for DM.    BP Readings from Last 3 Encounters:  06/09/22 130/70  06/09/22 130/70  03/24/22 112/64   Immunization History  Administered Date(s) Administered   Fluad Quad(high Dose 65+) 12/13/2018, 02/04/2022   Influenza, High Dose Seasonal PF 12/29/2016, 01/03/2018   Influenza-Unspecified 01/17/2020, 12/31/2020   PFIZER Comirnaty(Gray Top)Covid-19 Tri-Sucrose Vaccine 02/04/2022   PFIZER(Purple Top)SARS-COV-2 Vaccination 04/23/2019, 05/18/2019, 01/17/2020   Pfizer Covid-19 Vaccine Bivalent Booster 95yr & up 12/31/2020   Pneumococcal Conjugate-13 08/28/2015   Pneumococcal Polysaccharide-23 09/03/2016   Tdap 08/28/2015   Health Maintenance Due  Topic Date Due   Zoster Vaccines- Shingrix (1 of 2) Never done   COVID-19 Vaccine (6 - 2023-24 season) 04/01/2022   Diabetic kidney evaluation - Urine ACR  06/04/2022   HEMOGLOBIN A1C  06/09/2022      Past Medical History:  Diagnosis Date   Allergy    Arthritis    Asthma    Childhood   Diabetes mellitus    Diverticulitis    Diverticulosis    Gout    Gout    Hyperlipidemia    Obesity, Class III, BMI 40-49.9 (morbid obesity) ([place]    History reviewed. No pertinent surgical  history.  reports that he has never smoked. He has never used smokeless tobacco. He reports current alcohol use of about 2.0 standard drinks of alcohol per week. He reports that he does not use drugs. family history includes Asthma in his mother; Diabetes in his brother, brother, father, and sister; Heart attack in his maternal grandfather; Hypertension in his brother, brother, and father. No Known Allergies Current Outpatient Medications on File Prior to Visit  Medication Sig Dispense Refill   aspirin 81 MG tablet Take 81 mg by mouth daily.     colchicine 0.6 MG tablet TAKE 2 TABLETS BY MOUTH AT ONSET OF FLARE AND THEN 1 TABLET 1 HOUR LATER 180 tablet 1   ibuprofen (ADVIL) 800 MG tablet Take 1 tablet (800 mg total) by mouth every 6 (six) hours as needed. 60 tablet 1   lisinopril (ZESTRIL) 10 MG tablet Take 1 tablet (10 mg total) by mouth daily. 90 tablet 3   metFORMIN (GLUCOPHAGE) 1000 MG tablet TAKE 1 TABLET BY MOUTH TWICE A DAY 180 tablet 3   rosuvastatin (CRESTOR) 40 MG tablet Take 1 tablet (40 mg total) by mouth daily. 90 tablet 3   vitamin B-12 (CYANOCOBALAMIN) 1000 MCG tablet Take 1 tablet (1,000 mcg total) by mouth daily. 90 tablet 3   No current facility-administered medications on file  prior to visit.        ROS:  All others reviewed and negative.  Objective        PE:  BP 130/70   Pulse 65   Temp 98 F (36.7 C)   Ht '5\' 9"'$  (1.753 m)   Wt 260 lb 2 oz (118 kg)   SpO2 98%   BMI 38.41 kg/m                 Constitutional: Pt appears in NAD               HENT: Head: NCAT.                Right Ear: External ear normal.                 Left Ear: External ear normal.                Eyes: . Pupils are equal, round, and reactive to light. Conjunctivae and EOM are normal               Nose: without d/c or deformity               Neck: Neck supple. Gross normal ROM               Cardiovascular: Normal rate and regular rhythm.                 Pulmonary/Chest: Effort normal and  breath sounds without rales or wheezing.                Abd:  Soft, NT, ND, + BS, no organomegaly               Neurological: Pt is alert. At baseline orientation, motor grossly intact               Skin: Skin is warm. No rashes, no other new lesions, LE edema - none               Psychiatric: Pt behavior is normal without agitation   Micro: none  Cardiac tracings I have personally interpreted today:  none  Pertinent Radiological findings (summarize): none   Lab Results  Component Value Date   WBC 7.5 06/03/2021   HGB 14.2 06/03/2021   HCT 43.6 06/03/2021   PLT 213.0 06/03/2021   GLUCOSE 128 (H) 12/10/2021   CHOL 163 12/09/2021   TRIG 149.0 12/09/2021   HDL 44.20 12/09/2021   LDLCALC 89 12/09/2021   ALT 20 12/09/2021   AST 21 12/09/2021   NA 139 12/10/2021   K 5.0 12/10/2021   CL 109 12/10/2021   CREATININE 1.39 12/10/2021   BUN 24 (H) 12/10/2021   CO2 22 12/10/2021   TSH 1.74 06/03/2021   PSA 17.93 (H) 06/03/2021   INR 1.1 (H) 08/15/2020   HGBA1C 6.3 12/09/2021   MICROALBUR <0.7 06/03/2021   Assessment/Plan:  Roger Alvarado is a 72 y.o. Black or African American [2] male with  has a past medical history of Allergy, Arthritis, Asthma, Diabetes mellitus, Diverticulitis, Diverticulosis, Gout, Gout, Hyperlipidemia, and Obesity, Class III, BMI 40-49.9 (morbid obesity) ([place]).  Encounter for well adult exam with abnormal findings Age and sex appropriate education and counseling updated with regular exercise and diet Referrals for preventative services - none needed Immunizations addressed - for shingrix at pharmacy, declines covid booster Smoking counseling  - none needed Evidence for depression or other mood disorder - none significant Most  recent labs reviewed. I have personally reviewed and have noted: 1) the patient's medical and social history 2) The patient's current medications and supplements 3) The patient's height, weight, and BMI have been recorded in the  chart   B12 deficiency Lab Results  Component Value Date   VITAMINB12 231 12/09/2021   Low, to start oral replacement - b12 1000 mcg qd   CKD (chronic kidney disease) stage 3, GFR 30-59 ml/min (HCC) Lab Results  Component Value Date   CREATININE 1.39 12/10/2021   Stable overall, cont to avoid nephrotoxins   HTN (hypertension) BP Readings from Last 3 Encounters:  06/09/22 130/70  06/09/22 130/70  03/24/22 112/64   Stable, pt to continue medical treatment lisinopril 10 mg qd   Hyperlipidemia Lab Results  Component Value Date   LDLCALC 89 12/09/2021   Uncontrolled, goal ldl < 70, pt currently on increased crestor 40 mg qd, for f/u lab today   Type 2 diabetes mellitus ([place]) Lab Results  Component Value Date   HGBA1C 6.3 12/09/2021   Stable, pt to continue current medical treatment metformin 1000 bid, but in light of obesity will add mounjaro 2.5 mg weekly with an eye to reduce the metformin possible   Vitamin D deficiency Last vitamin D Lab Results  Component Value Date   VD25OH 18.90 (L) 12/09/2021   Low, to start oral replacement  Followup: Return in about 6 months (around 12/10/2022).  Cathlean Cower, MD 06/10/2022 4:52 AM [facility]

## 2022-06-09 NOTE — Patient Instructions (Addendum)
Please have your Shingrix (shingles) shots done at your local pharmacy.  Please take all new medication as prescribed - the mounjaro 2.5 mg weekly  Please continue all other medications as before, and refills have been done if requested.  Please have the pharmacy call with any other refills you may need.  Please continue your efforts at being more active, low cholesterol diet, and weight control.  You are otherwise up to date with prevention measures today.  Please keep your appointments with your specialists as you may have planned  Please go to the LAB at the blood drawing area for the tests to be done  You will be contacted by phone if any changes need to be made immediately.  Otherwise, you will receive a letter about your results with an explanation, but please check with MyChart first.  Please remember to sign up for MyChart if you have not done so, as this will be important to you in the future with finding out test results, communicating by private email, and scheduling acute appointments online when needed.  Please make an Appointment to return in 6 months, or sooner if needed

## 2022-06-09 NOTE — Progress Notes (Signed)
Subjective:   Roger Alvarado is a 72 y.o. male who presents for Medicare Annual/Subsequent preventive examination.  Review of Systems     Cardiac Risk Factors include: advanced age (>18mn, >>28women);diabetes mellitus;dyslipidemia;family history of premature cardiovascular disease;hypertension;male gender;obesity (BMI >30kg/m2)     Objective:    Today's Vitals   06/09/22 1428  BP: 130/70  Pulse: 65  Temp: 97.7 F (36.5 C)  TempSrc: Temporal  SpO2: 98%  Weight: 260 lb 3.2 oz (118 kg)  Height: '5\' 9"'$  (1.753 m)  PainSc: 0-No pain   Body mass index is 38.42 kg/m.     06/09/2022    2:53 PM 06/01/2021    4:03 PM 05/21/2020    1:40 PM 03/24/2017    8:05 AM 08/20/2016   12:00 PM  Advanced Directives  Does Patient Have a Medical Advance Directive? No No No No No  Would patient like information on creating a medical advance directive? No - Patient declined No - Patient declined No - Patient declined No - Patient declined     Current Medications (verified) Outpatient Encounter Medications as of 06/09/2022  Medication Sig   aspirin 81 MG tablet Take 81 mg by mouth daily.   colchicine 0.6 MG tablet TAKE 2 TABLETS BY MOUTH AT ONSET OF FLARE AND THEN 1 TABLET 1 HOUR LATER   ibuprofen (ADVIL) 800 MG tablet Take 1 tablet (800 mg total) by mouth every 6 (six) hours as needed.   lisinopril (ZESTRIL) 10 MG tablet Take 1 tablet (10 mg total) by mouth daily.   metFORMIN (GLUCOPHAGE) 1000 MG tablet TAKE 1 TABLET BY MOUTH TWICE A DAY   rosuvastatin (CRESTOR) 40 MG tablet Take 1 tablet (40 mg total) by mouth daily.   vitamin B-12 (CYANOCOBALAMIN) 1000 MCG tablet Take 1 tablet (1,000 mcg total) by mouth daily.   [DISCONTINUED] sulfamethoxazole-trimethoprim (BACTRIM DS) 800-160 MG tablet Take 1 tablet by mouth 2 (two) times daily.   No facility-administered encounter medications on file as of 06/09/2022.    Allergies (verified) Patient has no known allergies.   History: Past Medical  History:  Diagnosis Date   Allergy    Arthritis    Asthma    Childhood   Diabetes mellitus    Diverticulitis    Diverticulosis    Gout    Gout    Hyperlipidemia    Obesity, Class III, BMI 40-49.9 (morbid obesity) [place]    History reviewed. No pertinent surgical history. Family History  Problem Relation Age of Onset   Diabetes Brother    Hypertension Brother    Diabetes Brother    Hypertension Brother    Diabetes Father    Hypertension Father    Diabetes Sister    Asthma Mother    Heart attack Maternal Grandfather    Social History   Socioeconomic History   Marital status: Married    Spouse name: Not on file   Number of children: 2   Years of education: 18   Highest education level: Not on file  Occupational History   Occupation: Retired  Tobacco Use   Smoking status: Never   Smokeless tobacco: Never  Vaping Use   Vaping Use: Never used  Substance and Sexual Activity   Alcohol use: Yes    Alcohol/week: 2.0 standard drinks of alcohol    Types: 2 Cans of beer per week   Drug use: No   Sexual activity: Not on file  Other Topics Concern   Not on file  Social History  Narrative   Fun: Watch sports, gardening    Social Determinants of Health   Financial Resource Strain: Low Risk  (06/01/2021)   Overall Financial Resource Strain (CARDIA)    Difficulty of Paying Living Expenses: Not hard at all  Food Insecurity: No Food Insecurity (06/09/2022)   Hunger Vital Sign    Worried About Running Out of Food in the Last Year: Never true    Ran Out of Food in the Last Year: Never true  Transportation Needs: No Transportation Needs (06/09/2022)   PRAPARE - Hydrologist (Medical): No    Lack of Transportation (Non-Medical): No  Physical Activity: Sufficiently Active (06/09/2022)   Exercise Vital Sign    Days of Exercise per Week: 3 days    Minutes of Exercise per Session: 50 min  Stress: No Stress Concern Present (06/09/2022)   [place]    Feeling of Stress : Not at all  Social Connections: [place] (06/09/2022)   Social Connection and Isolation Panel [NHANES]    Frequency of Communication with Friends and Family: Twice a week    Frequency of Social Gatherings with Friends and Family: Once a week    Attends Religious Services: 1 to 4 times per year    Active Member of [employer] or Organizations: Yes    Attends Archivist Meetings: Patient refused    Marital Status: Married    Tobacco Counseling Counseling given: Not Answered   Clinical Intake:  Pre-visit preparation completed: Yes  Pain : No/denies pain Pain Score: 0-No pain     BMI - recorded: 38.42 Nutritional Status: BMI > 30  Obese Nutritional Risks: None Diabetes: Yes CBG done?: No Did pt. bring in CBG monitor from home?: No  How often do you need to have someone help you when you read instructions, pamphlets, or other written materials from your doctor or pharmacy?: 1 - Never What is the last grade level you completed in school?: HSG  Nutrition Risk Assessment:  Has the patient had any N/V/D within the last 2 months?  No  Does the patient have any non-healing wounds?  No  Has the patient had any unintentional weight loss or weight gain?  No   Diabetes:  Is the patient diabetic?  Yes  If diabetic, was a CBG obtained today?  No  Did the patient bring in their glucometer from home?  No  How often do you monitor your CBG's? No.   Financial Strains and Diabetes Management:  Are you having any financial strains with the device, your supplies or your medication? No .  Does the patient want to be seen by Chronic Care Management for management of their diabetes?  No  Would the patient like to be referred to a Nutritionist or for Diabetic Management?  No   Diabetic Exams:  Diabetic Eye Exam: Completed 01/29/2022 Diabetic Foot Exam: Completed 06/09/2022   Interpreter Needed?:  No  Information entered by :: Lisette Abu, LPN.   Activities of Daily Living    06/09/2022    2:54 PM 06/08/2022    7:42 PM  In your present state of health, do you have any difficulty performing the following activities:  Hearing? 0 0  Vision? 0 0  Difficulty concentrating or making decisions? 0 0  Walking or climbing stairs? 0 0  Dressing or bathing? 0 0  Doing errands, shopping? 0 0  Preparing Food and eating ? N N  Using the Toilet? N N  In the past six months, have you accidently leaked urine? N N  Do you have problems with loss of bowel control? N N  Managing your Medications? N N  Managing your Finances? N N  Housekeeping or managing your Housekeeping? N N    Patient Care Team: Biagio Borg, MD as PCP - General (Internal Medicine) Himmelrich, Bryson Ha, RD (Inactive) as Dietitian Pa, [facility] as Consulting Physician (Ophthalmology) Jola Schmidt, MD as Consulting Physician (Ophthalmology)  Indicate any recent Medical Services you may have received from other than [facility] providers in the past year (date may be approximate).     Assessment:   This is a routine wellness examination for [place].  Hearing/Vision screen Hearing Screening - Comments:: Patient denied any hearing difficulty.   No hearing aids.  Vision Screening - Comments:: Patient does wear corrective lenses/contacts.  Eye exam done by: Jola Schmidt, MD.   Dietary issues and exercise activities discussed: Current Exercise Habits: Home exercise routine, Time (Minutes): 50, Frequency (Times/Week): 3, Weekly Exercise (Minutes/Week): 150, Intensity: Moderate, Exercise limited by: None identified   Goals Addressed             This Visit's Progress    My goal for 2024 is to lose weight.  I would like to lose 40-50 pounds.        Depression Screen    06/09/2022    2:51 PM 03/24/2022    3:04 PM 12/09/2021   11:11 AM 06/03/2021   10:53 AM 06/03/2021   10:23 AM 06/01/2021    3:49 PM  08/15/2020    2:23 PM  PHQ 2/9 Scores  PHQ - 2 Score 0 0 0 0 0 0 1  PHQ- 9 Score 0 0 2        Fall Risk    06/09/2022    2:54 PM 06/08/2022    7:42 PM 03/24/2022    3:04 PM 12/09/2021   11:12 AM 06/03/2021   10:53 AM  Fall Risk   Falls in the past year? 0 0 0 0 0  Number falls in past yr: 0  0 0 0  Injury with Fall? 0  0 1 0  Risk for fall due to : No Fall Risks  No Fall Risks No Fall Risks   Follow up Falls prevention discussed  Falls evaluation completed Falls evaluation completed     [place]:  Any stairs in or around the home? No  If so, are there any without handrails? No  Home free of loose throw rugs in walkways, pet beds, electrical cords, etc? Yes  Adequate lighting in your home to reduce risk of falls? Yes   ASSISTIVE DEVICES UTILIZED TO PREVENT FALLS:  Life alert? No  Use of a cane, walker or w/c? No  Grab bars in the bathroom? Yes  Shower chair or bench in shower? No  Elevated toilet seat or a handicapped toilet? No   TIMED UP AND GO:  Was the test performed? Yes .  Length of time to ambulate 10 feet: 8 sec.   Gait steady and fast without use of assistive device  Cognitive Function:        06/09/2022    2:54 PM  6CIT Screen  What Year? 0 points  What month? 0 points  What time? 0 points  Count back from 20 0 points  Months in reverse 0 points  Repeat phrase 0 points  Total  Score 0 points    Immunizations Immunization History  Administered Date(s) Administered   Fluad Quad(high Dose 65+) 12/13/2018, 02/04/2022   Influenza, High Dose Seasonal PF 12/29/2016, 01/03/2018   Influenza-Unspecified 01/17/2020, 12/31/2020   PFIZER Comirnaty(Gray Top)Covid-19 Tri-Sucrose Vaccine 02/04/2022   PFIZER(Purple Top)SARS-COV-2 Vaccination 04/23/2019, 05/18/2019, 01/17/2020   Pfizer Covid-19 Vaccine Bivalent Booster 32yr & up 12/31/2020   Pneumococcal Conjugate-13 08/28/2015   Pneumococcal Polysaccharide-23 09/03/2016   Tdap  08/28/2015    TDAP status: Up to date  Flu Vaccine status: Up to date  Pneumococcal vaccine status: Up to date  Covid-19 vaccine status: Completed vaccines  Qualifies for Shingles Vaccine? Yes   Zostavax completed No   Shingrix Completed?: No.    Education has been provided regarding the importance of this vaccine. Patient has been advised to call insurance company to determine out of pocket expense if they have not yet received this vaccine. Advised may also receive vaccine at local pharmacy or Health Dept. Verbalized acceptance and understanding.  Screening Tests Health Maintenance  Topic Date Due   Zoster Vaccines- Shingrix (1 of 2) Never done   FOOT EXAM  08/15/2021   COVID-19 Vaccine (6 - 2023-24 season) 04/01/2022   Diabetic kidney evaluation - Urine ACR  06/04/2022   HEMOGLOBIN A1C  06/09/2022   Diabetic kidney evaluation - eGFR measurement  12/11/2022   OPHTHALMOLOGY EXAM  01/30/2023   Medicare Annual Wellness (AWV)  06/09/2023   DTaP/Tdap/Td (2 - Td or Tdap) 08/27/2025   COLONOSCOPY (Pts 45-449yrInsurance coverage will need to be confirmed)  06/02/2026   Pneumonia Vaccine 6517Years old  Completed   INFLUENZA VACCINE  Completed   Hepatitis C Screening  Completed   HPV VACCINES  Aged Out    Health Maintenance  Health Maintenance Due  Topic Date Due   Zoster Vaccines- Shingrix (1 of 2) Never done   FOOT EXAM  08/15/2021   COVID-19 Vaccine (6 - 2023-24 season) 04/01/2022   Diabetic kidney evaluation - Urine ACR  06/04/2022   HEMOGLOBIN A1C  06/09/2022    Colorectal cancer screening: Type of screening: Colonoscopy. Completed 06/02/2016. Repeat every 10 years  Lung Cancer Screening: (Low Dose CT Chest recommended if Age 73-80ears, 30 pack-year currently smoking OR have quit w/in 15years.) does not qualify.   Lung Cancer Screening Referral: no  Additional Screening:  Hepatitis C Screening: does qualify; Completed 09/03/2016  Vision Screening: Recommended  annual ophthalmology exams for early detection of glaucoma and other disorders of the eye. Is the patient up to date with their annual eye exam?  Yes  Who is the provider or what is the name of the office in which the patient attends annual eye exams? [facility]. If pt is not established with a provider, would they like to be referred to a provider to establish care? No .   Dental Screening: Recommended annual dental exams for proper oral hygiene  Community Resource Referral / Chronic Care Management: CRR required this visit?  No   CCM required this visit?  No      Plan:     I have personally reviewed and noted the following in the patient's chart:   Medical and social history Use of alcohol, tobacco or illicit drugs  Current medications and supplements including opioid prescriptions. Patient is not currently taking opioid prescriptions. Functional ability and status Nutritional status Physical activity Advanced directives List of other physicians Hospitalizations, surgeries, and ER visits in previous 12 months Vitals Screenings to include cognitive,  depression, and falls Referrals and appointments  In addition, I have reviewed and discussed with patient certain preventive protocols, quality metrics, and best practice recommendations. A written personalized care plan for preventive services as well as general preventive health recommendations were provided to patient.     Sheral Flow, LPN   [id number]   Nurse Notes:  Normal cognitive status assessed by direct observation by this Nurse Health Advisor. No abnormalities found.

## 2022-06-09 NOTE — Patient Instructions (Signed)
[name] , Thank you for taking time to come for your Medicare Wellness Visit. I appreciate your ongoing commitment to your health goals. Please review the following plan we discussed and let me know if I can assist you in the future.   These are the goals we discussed:  Goals      My goal for 2024 is to lose weight.  I would like to lose 40-50 pounds.        This is a list of the screening recommended for you and due dates:  Health Maintenance  Topic Date Due   Zoster (Shingles) Vaccine (1 of 2) Never done   Complete foot exam   08/15/2021   COVID-19 Vaccine (6 - 2023-24 season) 04/01/2022   Yearly kidney health urinalysis for diabetes  06/04/2022   Hemoglobin A1C  06/09/2022   Yearly kidney function blood test for diabetes  12/11/2022   Eye exam for diabetics  01/30/2023   Medicare Annual Wellness Visit  06/09/2023   DTaP/Tdap/Td vaccine (2 - Td or Tdap) 08/27/2025   Colon Cancer Screening  06/02/2026   Pneumonia Vaccine  Completed   Flu Shot  Completed   Hepatitis C Screening: USPSTF Recommendation to screen - Ages 35-79 yo.  Completed   HPV Vaccine  Aged Out    Advanced directives: No  Conditions/risks identified: Yes; Type II Diabetes  Next appointment: Follow up in one year for your annual wellness visit.   Preventive Care 65 Years and Older, Male  Preventive care refers to lifestyle choices and visits with your health care provider that can promote health and wellness. What does preventive care include? A yearly physical exam. This is also called an annual well check. Dental exams once or twice a year. Routine eye exams. Ask your health care provider how often you should have your eyes checked. Personal lifestyle choices, including: Daily care of your teeth and gums. Regular physical activity. Eating a healthy diet. Avoiding tobacco and drug use. Limiting alcohol use. Practicing safe sex. Taking low doses of aspirin every day. Taking vitamin and mineral  supplements as recommended by your health care provider. What happens during an annual well check? The services and screenings done by your health care provider during your annual well check will depend on your age, overall health, lifestyle risk factors, and family history of disease. Counseling  Your health care provider may ask you questions about your: Alcohol use. Tobacco use. Drug use. Emotional well-being. Home and relationship well-being. Sexual activity. Eating habits. History of falls. Memory and ability to understand (cognition). Work and work Statistician. Screening  You may have the following tests or measurements: Height, weight, and BMI. Blood pressure. Lipid and cholesterol levels. These may be checked every 5 years, or more frequently if you are over 11 years old. Skin check. Lung cancer screening. You may have this screening every year starting at age 18 if you have a 30-pack-year history of smoking and currently smoke or have quit within the past 15 years. Fecal occult blood test (FOBT) of the stool. You may have this test every year starting at age 5. Flexible sigmoidoscopy or colonoscopy. You may have a sigmoidoscopy every 5 years or a colonoscopy every 10 years starting at age 43. Prostate cancer screening. Recommendations will vary depending on your family history and other risks. Hepatitis C blood test. Hepatitis B blood test. Sexually transmitted disease (STD) testing. Diabetes screening. This is done by checking your blood sugar (glucose) after you have not eaten  for a while (fasting). You may have this done every 1-3 years. Abdominal aortic aneurysm (AAA) screening. You may need this if you are a current or former smoker. Osteoporosis. You may be screened starting at age 83 if you are at high risk. Talk with your health care provider about your test results, treatment options, and if necessary, the need for more tests. Vaccines  Your health care provider  may recommend certain vaccines, such as: Influenza vaccine. This is recommended every year. Tetanus, diphtheria, and acellular pertussis (Tdap, Td) vaccine. You may need a Td booster every 10 years. Zoster vaccine. You may need this after age 49. Pneumococcal 13-valent conjugate (PCV13) vaccine. One dose is recommended after age 6. Pneumococcal polysaccharide (PPSV23) vaccine. One dose is recommended after age 13. Talk to your health care provider about which screenings and vaccines you need and how often you need them. This information is not intended to replace advice given to you by your health care provider. Make sure you discuss any questions you have with your health care provider. Document Released: 04/18/2015 Document Revised: 12/10/2015 Document Reviewed: 01/21/2015 Elsevier Interactive Patient Education  2017 [place] Prevention in the Home Falls can cause injuries. They can happen to people of all ages. There are many things you can do to make your home safe and to help prevent falls. What can I do on the outside of my home? Regularly fix the edges of walkways and driveways and fix any cracks. Remove anything that might make you trip as you walk through a door, such as a raised step or threshold. Trim any bushes or trees on the path to your home. Use bright outdoor lighting. Clear any walking paths of anything that might make someone trip, such as rocks or tools. Regularly check to see if handrails are loose or broken. Make sure that both sides of any steps have handrails. Any raised decks and porches should have guardrails on the edges. Have any leaves, snow, or ice cleared regularly. Use sand or salt on walking paths during winter. Clean up any spills in your garage right away. This includes oil or grease spills. What can I do in the bathroom? Use night lights. Install grab bars by the toilet and in the tub and shower. Do not use towel bars as grab bars. Use  non-skid mats or decals in the tub or shower. If you need to sit down in the shower, use a plastic, non-slip stool. Keep the floor dry. Clean up any water that spills on the floor as soon as it happens. Remove soap buildup in the tub or shower regularly. Attach bath mats securely with double-sided non-slip rug tape. Do not have throw rugs and other things on the floor that can make you trip. What can I do in the bedroom? Use night lights. Make sure that you have a light by your bed that is easy to reach. Do not use any sheets or blankets that are too big for your bed. They should not hang down onto the floor. Have a firm chair that has side arms. You can use this for support while you get dressed. Do not have throw rugs and other things on the floor that can make you trip. What can I do in the kitchen? Clean up any spills right away. Avoid walking on wet floors. Keep items that you use a lot in easy-to-reach places. If you need to reach something above you, use a strong step stool that has  a grab bar. Keep electrical cords out of the way. Do not use floor polish or wax that makes floors slippery. If you must use wax, use non-skid floor wax. Do not have throw rugs and other things on the floor that can make you trip. What can I do with my stairs? Do not leave any items on the stairs. Make sure that there are handrails on both sides of the stairs and use them. Fix handrails that are broken or loose. Make sure that handrails are as long as the stairways. Check any carpeting to make sure that it is firmly attached to the stairs. Fix any carpet that is loose or worn. Avoid having throw rugs at the top or bottom of the stairs. If you do have throw rugs, attach them to the floor with carpet tape. Make sure that you have a light switch at the top of the stairs and the bottom of the stairs. If you do not have them, ask someone to add them for you. What else can I do to help prevent falls? Wear  shoes that: Do not have high heels. Have rubber bottoms. Are comfortable and fit you well. Are closed at the toe. Do not wear sandals. If you use a stepladder: Make sure that it is fully opened. Do not climb a closed stepladder. Make sure that both sides of the stepladder are locked into place. Ask someone to hold it for you, if possible. Clearly mark and make sure that you can see: Any grab bars or handrails. First and last steps. Where the edge of each step is. Use tools that help you move around (mobility aids) if they are needed. These include: Canes. Walkers. Scooters. Crutches. Turn on the lights when you go into a dark area. Replace any light bulbs as soon as they burn out. Set up your furniture so you have a clear path. Avoid moving your furniture around. If any of your floors are uneven, fix them. If there are any pets around you, be aware of where they are. Review your medicines with your doctor. Some medicines can make you feel dizzy. This can increase your chance of falling. Ask your doctor what other things that you can do to help prevent falls. This information is not intended to replace advice given to you by your health care provider. Make sure you discuss any questions you have with your health care provider. Document Released: 01/16/2009 Document Revised: 08/28/2015 Document Reviewed: 04/26/2014 Elsevier Interactive Patient Education  2017 [employer].

## 2022-06-10 ENCOUNTER — Encounter: Payer: Self-pay | Admitting: Internal Medicine

## 2022-06-10 LAB — URINALYSIS, ROUTINE W REFLEX MICROSCOPIC
Bilirubin Urine: NEGATIVE
Ketones, ur: NEGATIVE
Leukocytes,Ua: NEGATIVE
Nitrite: NEGATIVE
Specific Gravity, Urine: >=1.03 — AB (ref 1.000–1.030)
Total Protein, Urine: 100 — AB
Urine Glucose: NEGATIVE
Urobilinogen, UA: 0.2 (ref 0.0–1.0)
pH: 6 (ref 5.0–8.0)

## 2022-06-10 LAB — HEMOGLOBIN A1C: Hgb A1c MFr Bld: 6 % (ref 4.6–6.5)

## 2022-06-10 NOTE — Assessment & Plan Note (Signed)
Lab Results  Component Value Date   HGBA1C 6.3 12/09/2021   Stable, pt to continue current medical treatment metformin 1000 bid, but in light of obesity will add mounjaro 2.5 mg weekly with an eye to reduce the metformin possible

## 2022-06-10 NOTE — Assessment & Plan Note (Signed)
Age and sex appropriate education and counseling updated with regular exercise and diet Referrals for preventative services - none needed Immunizations addressed - for shingrix at pharmacy, declines covid booster Smoking counseling  - none needed Evidence for depression or other mood disorder - none significant Most recent labs reviewed. I have personally reviewed and have noted: 1) the patient's medical and social history 2) The patient's current medications and supplements 3) The patient's height, weight, and BMI have been recorded in the chart

## 2022-06-10 NOTE — Assessment & Plan Note (Signed)
Lab Results  Component Value Date   LDLCALC 89 12/09/2021   Uncontrolled, goal ldl < 70, pt currently on increased crestor 40 mg qd, for f/u lab today

## 2022-06-10 NOTE — Assessment & Plan Note (Signed)
Lab Results  Component Value Date   VITAMINB12 231 12/09/2021   Low, to start oral replacement - b12 1000 mcg qd

## 2022-06-10 NOTE — Assessment & Plan Note (Signed)
Last vitamin D Lab Results  Component Value Date   VD25OH 18.90 (L) 12/09/2021   Low, to start oral replacement

## 2022-06-10 NOTE — Assessment & Plan Note (Signed)
BP Readings from Last 3 Encounters:  06/09/22 130/70  06/09/22 130/70  03/24/22 112/64   Stable, pt to continue medical treatment lisinopril 10 mg qd

## 2022-06-10 NOTE — Assessment & Plan Note (Signed)
Lab Results  Component Value Date   CREATININE 1.39 12/10/2021   Stable overall, cont to avoid nephrotoxins

## 2022-07-30 ENCOUNTER — Telehealth: Payer: Self-pay

## 2022-07-30 NOTE — Telephone Encounter (Signed)
PA initiated via Covermymeds; KEY: [id number]. Awaiting determination.

## 2022-07-30 NOTE — Telephone Encounter (Signed)
PA approved.   26-APR-24:26-APR-25 Mounjaro 2.5MG /0.5ML [place] SOPN Quantity:6;

## 2022-10-22 ENCOUNTER — Telehealth: Payer: Self-pay | Admitting: *Deleted

## 2022-10-22 NOTE — Telephone Encounter (Signed)
I connected with Roger Alvarado on 10/22/2022 at 1028 by telephone and verified that I am speaking with the correct person using two identifiers. According to the patient's chart they are due for follow up  with [name]. Pt scheduled. There are no transportation issues at this time. Nothing further was needed at the end of our conversation.

## 2022-10-31 ENCOUNTER — Other Ambulatory Visit: Payer: Self-pay | Admitting: Internal Medicine

## 2022-11-01 ENCOUNTER — Other Ambulatory Visit: Payer: Self-pay

## 2022-11-18 ENCOUNTER — Encounter (INDEPENDENT_AMBULATORY_CARE_PROVIDER_SITE_OTHER): Payer: Self-pay

## 2022-12-02 NOTE — Progress Notes (Signed)
[facility] [address] [place] [address] Phone: [phone] Subjective:   Roger Alvarado, am serving as a scribe for Dr. Antoine Primas.  I'm seeing this patient by the request  of:  Corwin Levins, MD  CC: Hip pain  [id number]  09/29/2017 I believe the patient is doing relatively well at this time.  Continue same medications same exercises follow-up in 6 to 12 weeks     Patient given injection and tolerated procedure well.  We discussed icing regimen and home exercises.  Discussed which activities to doing which wants to avoid.  Patient is to increase activity slowly over the course the next several weeks.  Follow-up with me again in 6 to 12 weeks.     Updated 12/09/2022 Roger Alvarado is a 72 y.o. male coming in with complaint of back and hip pain. Has gotten better. Did some heavy lifting and went back for about a month.  Patient states that is getting better overall.  Still has some discomfort but nothing quite as severe.       Past Medical History:  Diagnosis Date   Allergy    Arthritis    Asthma    Childhood   Diabetes mellitus    Diverticulitis    Diverticulosis    Gout    Gout    Hyperlipidemia    Obesity, Class III, BMI 40-49.9 (morbid obesity) (HCC)    No past surgical history on file. Social History   Socioeconomic History   Marital status: Married    Spouse name: Not on file   Number of children: 2   Years of education: 18   Highest education level: Not on file  Occupational History   Occupation: Retired  Tobacco Use   Smoking status: Never   Smokeless tobacco: Never  Vaping Use   Vaping status: Never Used  Substance and Sexual Activity   Alcohol use: Yes    Alcohol/week: 2.0 standard drinks of alcohol    Types: 2 Cans of beer per week   Drug use: No   Sexual activity: Not on file  Other Topics Concern   Not on file  Social History Narrative   Fun: Watch sports, gardening    Social Determinants of  Health   Financial Resource Strain: Low Risk  (06/01/2021)   Overall Financial Resource Strain (CARDIA)    Difficulty of Paying Living Expenses: Not hard at all  Food Insecurity: No Food Insecurity (06/09/2022)   Hunger Vital Sign    Worried About Running Out of Food in the Last Year: Never true    Ran Out of Food in the Last Year: Never true  Transportation Needs: No Transportation Needs (10/22/2022)   PRAPARE - Administrator, Civil Service (Medical): No    Lack of Transportation (Non-Medical): No  Physical Activity: Sufficiently Active (06/09/2022)   Exercise Vital Sign    Days of Exercise per Week: 3 days    Minutes of Exercise per Session: 50 min  Stress: No Stress Concern Present (06/09/2022)   [employer] of Occupational Health - Occupational Stress Questionnaire    Feeling of Stress : Not at all  Social Connections: Socially Integrated (06/09/2022)   Social Connection and Isolation Panel [NHANES]    Frequency of Communication with Friends and Family: Twice a week    Frequency of Social Gatherings with Friends and Family: Once a week    Attends Religious Services: 1 to 4 times per year  Active Member of Clubs or Organizations: Yes    Attends Banker Meetings: Patient declined    Marital Status: Married   No Known Allergies Family History  Problem Relation Age of Onset   Diabetes Brother    Hypertension Brother    Diabetes Brother    Hypertension Brother    Diabetes Father    Hypertension Father    Diabetes Sister    Asthma Mother    Heart attack Maternal Grandfather     Current Outpatient Medications (Endocrine & Metabolic):    metFORMIN (GLUCOPHAGE) 1000 MG tablet, TAKE 1 TABLET BY MOUTH TWICE A DAY   tirzepatide (MOUNJARO) 2.5 MG/0.5ML Pen, Inject 2.5 mg into the skin once a week. E11.9  Current Outpatient Medications (Cardiovascular):    lisinopril (ZESTRIL) 10 MG tablet, Take 1 tablet (10 mg total) by mouth daily.   rosuvastatin  (CRESTOR) 40 MG tablet, Take 1 tablet (40 mg total) by mouth daily.   Current Outpatient Medications (Analgesics):    aspirin 81 MG tablet, Take 81 mg by mouth daily.   colchicine 0.6 MG tablet, TAKE 2 TABLETS BY MOUTH AT ONSET OF FLARE AND THEN 1 TABLET 1 HOUR LATER   ibuprofen (ADVIL) 800 MG tablet, Take 1 tablet (800 mg total) by mouth every 6 (six) hours as needed.  Current Outpatient Medications (Hematological):    vitamin B-12 (CYANOCOBALAMIN) 1000 MCG tablet, Take 1 tablet (1,000 mcg total) by mouth daily.    Reviewed prior external information including notes and imaging from  primary care provider As well as notes that were available from care everywhere and other healthcare systems.  Past medical history, social, surgical and family history all reviewed in electronic medical record.  No pertanent information unless stated regarding to the chief complaint.   Review of Systems:  No headache, visual changes, nausea, vomiting, diarrhea, constipation, dizziness, abdominal pain, skin rash, fevers, chills, night sweats, weight loss, swollen lymph nodes, body aches, joint swelling, chest pain, shortness of breath, mood changes. POSITIVE muscle aches  Objective  Blood pressure 128/68, pulse 75, height 5\' 9"  (1.753 m), weight 254 lb (115.2 kg), SpO2 98%.   General: No apparent distress alert and oriented x3 mood and affect normal, dressed appropriately.  HEENT: Pupils equal, extraocular movements intact  Respiratory: Patient's speak in full sentences and does not appear short of breath  Cardiovascular: No lower extremity edema, non tender, no erythema  Right hip severe tenderness to palpation over the greater trochanteric area.  Patient has a positive FABER test noted.  Negative straight leg test noted.    After verbal consent patient was prepped with alcohol swab and with a 21-gauge 2 inch needle injected into the right greater trochanteric area with 2 cc of 0.5% Marcaine and 1 cc  of Kenalog 40 mg/mL.  No blood loss.  Band-Aid placed.  Postinjection instructions given   Impression and Recommendations:    The above documentation has been reviewed and is accurate and complete Roger Saa, DO

## 2022-12-09 ENCOUNTER — Encounter: Payer: Self-pay | Admitting: Family Medicine

## 2022-12-09 ENCOUNTER — Ambulatory Visit: Payer: PPO | Admitting: Family Medicine

## 2022-12-09 VITALS — BP 128/68 | HR 75 | Ht 69.0 in | Wt 254.0 lb

## 2022-12-09 DIAGNOSIS — M7061 Trochanteric bursitis, right hip: Secondary | ICD-10-CM | POA: Diagnosis not present

## 2022-12-09 NOTE — Assessment & Plan Note (Signed)
Chronic problem with worsening symptoms.  Patient did respond well to the injection today.  The patient has been attempting to lose weight and is doing a good job.  Encouraged him to continue to be active.  Discussed with patient that the differential includes lumbar radiculopathy and if worsening symptoms we may need to plan a monitor that as well.  Follow-up again in 6 to 8 weeks otherwise

## 2022-12-09 NOTE — Patient Instructions (Signed)
Injection in GT Tell your wife to hit it big on the slots See you again in 6-8 weeks if needed

## 2022-12-15 ENCOUNTER — Encounter: Payer: Self-pay | Admitting: Internal Medicine

## 2022-12-15 ENCOUNTER — Ambulatory Visit (INDEPENDENT_AMBULATORY_CARE_PROVIDER_SITE_OTHER): Payer: PPO | Admitting: Internal Medicine

## 2022-12-15 VITALS — BP 130/82 | HR 65 | Temp 98.4°F | Ht 69.0 in | Wt 249.0 lb

## 2022-12-15 DIAGNOSIS — Z789 Other specified health status: Secondary | ICD-10-CM | POA: Insufficient documentation

## 2022-12-15 DIAGNOSIS — R195 Other fecal abnormalities: Secondary | ICD-10-CM | POA: Insufficient documentation

## 2022-12-15 DIAGNOSIS — E1165 Type 2 diabetes mellitus with hyperglycemia: Secondary | ICD-10-CM

## 2022-12-15 DIAGNOSIS — N1831 Chronic kidney disease, stage 3a: Secondary | ICD-10-CM

## 2022-12-15 DIAGNOSIS — R1011 Right upper quadrant pain: Secondary | ICD-10-CM | POA: Insufficient documentation

## 2022-12-15 DIAGNOSIS — E538 Deficiency of other specified B group vitamins: Secondary | ICD-10-CM

## 2022-12-15 DIAGNOSIS — E559 Vitamin D deficiency, unspecified: Secondary | ICD-10-CM | POA: Diagnosis not present

## 2022-12-15 DIAGNOSIS — K5732 Diverticulitis of large intestine without perforation or abscess without bleeding: Secondary | ICD-10-CM | POA: Insufficient documentation

## 2022-12-15 DIAGNOSIS — I10 Essential (primary) hypertension: Secondary | ICD-10-CM

## 2022-12-15 DIAGNOSIS — K5792 Diverticulitis of intestine, part unspecified, without perforation or abscess without bleeding: Secondary | ICD-10-CM | POA: Insufficient documentation

## 2022-12-15 DIAGNOSIS — Z7984 Long term (current) use of oral hypoglycemic drugs: Secondary | ICD-10-CM

## 2022-12-15 DIAGNOSIS — R748 Abnormal levels of other serum enzymes: Secondary | ICD-10-CM | POA: Insufficient documentation

## 2022-12-15 DIAGNOSIS — E7800 Pure hypercholesterolemia, unspecified: Secondary | ICD-10-CM | POA: Diagnosis not present

## 2022-12-15 DIAGNOSIS — Z8719 Personal history of other diseases of the digestive system: Secondary | ICD-10-CM | POA: Insufficient documentation

## 2022-12-15 DIAGNOSIS — Z23 Encounter for immunization: Secondary | ICD-10-CM | POA: Diagnosis not present

## 2022-12-15 DIAGNOSIS — Z7985 Long-term (current) use of injectable non-insulin antidiabetic drugs: Secondary | ICD-10-CM

## 2022-12-15 DIAGNOSIS — R1013 Epigastric pain: Secondary | ICD-10-CM | POA: Insufficient documentation

## 2022-12-15 LAB — HEPATIC FUNCTION PANEL
ALT: 28 U/L (ref 0–53)
AST: 24 U/L (ref 0–37)
Albumin: 4.3 g/dL (ref 3.5–5.2)
Alkaline Phosphatase: 69 U/L (ref 39–117)
Bilirubin, Direct: 0.2 mg/dL (ref 0.0–0.3)
Total Bilirubin: 0.8 mg/dL (ref 0.2–1.2)
Total Protein: 7.2 g/dL (ref 6.0–8.3)

## 2022-12-15 LAB — BASIC METABOLIC PANEL
BUN: 20 mg/dL (ref 6–23)
CO2: 24 meq/L (ref 19–32)
Calcium: 10.1 mg/dL (ref 8.4–10.5)
Chloride: 109 meq/L (ref 96–112)
Creatinine, Ser: 1.2 mg/dL (ref 0.40–1.50)
GFR: 60.13 mL/min (ref >60.00)
Glucose, Bld: 94 mg/dL (ref 70–99)
Potassium: 4.4 meq/L (ref 3.5–5.1)
Sodium: 141 meq/L (ref 135–145)

## 2022-12-15 LAB — HEMOGLOBIN A1C: Hgb A1c MFr Bld: 6 % (ref 4.6–6.5)

## 2022-12-15 LAB — LIPID PANEL
Cholesterol: 112 mg/dL (ref 0–200)
HDL: 58.6 mg/dL (ref >39.00)
LDL Cholesterol: 43 mg/dL (ref 0–99)
NonHDL: 53.49
Total CHOL/HDL Ratio: 2
Triglycerides: 52 mg/dL (ref 0.0–149.0)
VLDL: 10.4 mg/dL (ref 0.0–40.0)

## 2022-12-15 LAB — VITAMIN B12: Vitamin B-12: 171 pg/mL — ABNORMAL LOW (ref 211–911)

## 2022-12-15 LAB — VITAMIN D 25 HYDROXY (VIT D DEFICIENCY, FRACTURES): VITD: 22.9 ng/mL — ABNORMAL LOW (ref 30.00–100.00)

## 2022-12-15 NOTE — Patient Instructions (Signed)
You had the Shingles shot #1 today  Please make a Nurse Visit appt for 2 mo from now to have the Shingles shot #2  Please continue all other medications as before, and refills have been done if requested.  Please have the pharmacy call with any other refills you may need.  Please continue your efforts at being more active, low cholesterol diet, and weight control.  Please keep your appointments with your specialists as you may have planned  Please go to the LAB at the blood drawing area for the tests to be done  You will be contacted by phone if any changes need to be made immediately.  Otherwise, you will receive a letter about your results with an explanation, but please check with MyChart first.  Please make an Appointment to return in 6 months, or sooner if needed

## 2022-12-15 NOTE — Progress Notes (Addendum)
Patient ID: Roger Alvarado, male   DOB: 07-22-1950, 72 y.o.   MRN: [id number]        Chief Complaint: follow up HTN, HLD and DM, low b12 and Vit D, ckd3a       HPI:  Roger Alvarado is a 72 y.o. male here overall doing ok, Pt denies chest pain, increased sob or doe, wheezing, orthopnea, PND, increased LE swelling, palpitations, dizziness or syncope.   Pt denies polydipsia, polyuria, or new focal neuro s/s.    Pt denies fever, wt loss, night sweats, loss of appetite, or other constitutional symptoms  Due for shingrix today       Wt Readings from Last 3 Encounters:  12/15/22 249 lb (112.9 kg)  12/09/22 254 lb (115.2 kg)  06/09/22 260 lb 2 oz (118 kg)   BP Readings from Last 3 Encounters:  12/15/22 130/82  12/09/22 128/68  06/09/22 130/70         Past Medical History:  Diagnosis Date   Allergy    Arthritis    Asthma    Childhood   Diabetes mellitus    Diverticulitis    Diverticulosis    Gout    Gout    Hyperlipidemia    Obesity, Class III, BMI 40-49.9 (morbid obesity) (HCC)    History reviewed. No pertinent surgical history.  reports that he has never smoked. He has never used smokeless tobacco. He reports current alcohol use of about 2.0 standard drinks of alcohol per week. He reports that he does not use drugs. family history includes Asthma in his mother; Diabetes in his brother, brother, father, and sister; Heart attack in his maternal grandfather; Hypertension in his brother, brother, and father. No Known Allergies Current Outpatient Medications on File Prior to Visit  Medication Sig Dispense Refill   aspirin 81 MG tablet Take 81 mg by mouth daily.     colchicine 0.6 MG tablet TAKE 2 TABLETS BY MOUTH AT ONSET OF FLARE AND THEN 1 TABLET 1 HOUR LATER 180 tablet 1   ibuprofen (ADVIL) 800 MG tablet Take 1 tablet (800 mg total) by mouth every 6 (six) hours as needed. 60 tablet 1   lisinopril (ZESTRIL) 10 MG tablet Take 1 tablet (10 mg total) by mouth daily. 90 tablet 3    metFORMIN (GLUCOPHAGE) 1000 MG tablet TAKE 1 TABLET BY MOUTH TWICE A DAY 180 tablet 3   rosuvastatin (CRESTOR) 40 MG tablet Take 1 tablet (40 mg total) by mouth daily. 90 tablet 3   vitamin B-12 (CYANOCOBALAMIN) 1000 MCG tablet Take 1 tablet (1,000 mcg total) by mouth daily. 90 tablet 3   tirzepatide (MOUNJARO) 2.5 MG/0.5ML Pen Inject 2.5 mg into the skin once a week. E11.9 (Patient not taking: Reported on 12/15/2022) 6 mL 3   No current facility-administered medications on file prior to visit.        ROS:  All others reviewed and negative.  Objective        PE:  BP 130/82 (BP Location: Right Arm, Patient Position: Sitting, Cuff Size: Normal)   Pulse 65   Temp 98.4 F (36.9 C) (Oral)   Ht 5\' 9"  (1.753 m)   Wt 249 lb (112.9 kg)   SpO2 98%   BMI 36.77 kg/m                 Constitutional: Pt appears in NAD               HENT: Head: NCAT.  Right Ear: External ear normal.                 Left Ear: External ear normal.                Eyes: . Pupils are equal, round, and reactive to light. Conjunctivae and EOM are normal               Nose: without d/c or deformity               Neck: Neck supple. Gross normal ROM               Cardiovascular: Normal rate and regular rhythm.                 Pulmonary/Chest: Effort normal and breath sounds without rales or wheezing.                Abd:  Soft, NT, ND, + BS, no organomegaly               Neurological: Pt is alert. At baseline orientation, motor grossly intact               Skin: Skin is warm. No rashes, no other new lesions, LE edema - none               Psychiatric: Pt behavior is normal without agitation   Micro: none  Cardiac tracings I have personally interpreted today:  none  Pertinent Radiological findings (summarize): none   Lab Results  Component Value Date   WBC 7.6 06/09/2022   HGB 14.6 06/09/2022   HCT 43.9 06/09/2022   PLT 192.0 06/09/2022   GLUCOSE 94 12/15/2022   CHOL 112 12/15/2022   TRIG 52.0  12/15/2022   HDL 58.60 12/15/2022   LDLCALC 43 12/15/2022   ALT 28 12/15/2022   AST 24 12/15/2022   NA 141 12/15/2022   K 4.4 12/15/2022   CL 109 12/15/2022   CREATININE 1.20 12/15/2022   BUN 20 12/15/2022   CO2 24 12/15/2022   TSH 1.19 06/09/2022   PSA 0.27 06/09/2022   INR 1.1 (H) 08/15/2020   HGBA1C 6.0 12/15/2022   MICROALBUR 14.1 (H) 06/09/2022   Assessment/Plan:  Roger Alvarado is a 72 y.o. Black or African American [2] male with  has a past medical history of Allergy, Arthritis, Asthma, Diabetes mellitus, Diverticulitis, Diverticulosis, Gout, Gout, Hyperlipidemia, and Obesity, Class III, BMI 40-49.9 (morbid obesity) (HCC).  B12 deficiency Lab Results  Component Value Date   VITAMINB12 171 (L) 12/15/2022   Low, to start oral replacement - b12 1000 mcg qd   CKD (chronic kidney disease) stage 3, GFR 30-59 ml/min (HCC) Lab Results  Component Value Date   CREATININE 1.20 12/15/2022   Stable overall, cont to avoid nephrotoxins   HTN (hypertension) BP Readings from Last 3 Encounters:  12/15/22 130/82  12/09/22 128/68  06/09/22 130/70   Stable, pt to continue medical treatment lisinopril 10 qd    Hyperlipidemia Lab Results  Component Value Date   LDLCALC 43 12/15/2022   Stable, pt to continue current statin crestor 40 qd   Type 2 diabetes mellitus (HCC) Lab Results  Component Value Date   HGBA1C 6.0 12/15/2022   Stable, pt to continue current medical treatment metformin 1000  bid, and start mounjaro 2.5 weekly when can afford   Vitamin D deficiency Last vitamin D Lab Results  Component Value Date   VD25OH 22.90 (L) 12/15/2022   Low, to  start oral replacement  Followup: Return in about 6 months (around 06/14/2023).  Roger Barre, MD 12/18/2022 5:55 PM [facility]

## 2022-12-18 ENCOUNTER — Encounter: Payer: Self-pay | Admitting: Internal Medicine

## 2022-12-18 NOTE — Assessment & Plan Note (Signed)
BP Readings from Last 3 Encounters:  12/15/22 130/82  12/09/22 128/68  06/09/22 130/70   Stable, pt to continue medical treatment lisinopril 10 qd

## 2022-12-18 NOTE — Assessment & Plan Note (Signed)
Lab Results  Component Value Date   CREATININE 1.20 12/15/2022   Stable overall, cont to avoid nephrotoxins

## 2022-12-18 NOTE — Assessment & Plan Note (Signed)
Lab Results  Component Value Date   LDLCALC 43 12/15/2022   Stable, pt to continue current statin crestor 40 qd

## 2022-12-18 NOTE — Addendum Note (Signed)
Addended by: Corwin Levins on: 12/18/2022 05:55 PM   Modules accepted: Level of Service

## 2022-12-18 NOTE — Assessment & Plan Note (Signed)
Last vitamin D Lab Results  Component Value Date   VD25OH 22.90 (L) 12/15/2022   Low, to start oral replacement

## 2022-12-18 NOTE — Assessment & Plan Note (Signed)
Lab Results  Component Value Date   VITAMINB12 171 (L) 12/15/2022   Low, to start oral replacement - b12 1000 mcg qd

## 2022-12-18 NOTE — Assessment & Plan Note (Signed)
Lab Results  Component Value Date   HGBA1C 6.0 12/15/2022   Stable, pt to continue current medical treatment metformin 1000  bid, and start mounjaro 2.5 weekly when can afford

## 2022-12-22 ENCOUNTER — Other Ambulatory Visit: Payer: Self-pay | Admitting: Internal Medicine

## 2022-12-22 ENCOUNTER — Other Ambulatory Visit: Payer: Self-pay

## 2023-01-20 ENCOUNTER — Ambulatory Visit: Payer: PPO | Admitting: Family Medicine

## 2023-01-24 NOTE — Progress Notes (Unsigned)
[facility] [address] [place] [address] Phone: [phone] Subjective:   Roger Alvarado, am serving as a scribe for Dr. Antoine Primas.  I'm seeing this patient by the request  of:  Corwin Levins, MD  CC: Right hip pain  [id number]  12/09/2022 Chronic problem with worsening symptoms. Patient did respond well to the injection today. The patient has been attempting to lose weight and is doing a good job. Encouraged him to continue to be active. Discussed with patient that the differential includes lumbar radiculopathy and if worsening symptoms we may need to plan a monitor that as well. Follow-up again in 6 to 8 weeks otherwise   Updated 01/25/2023 Roger Alvarado is a 72 y.o. male coming in with complaint of hip pain. Injection helped. Started to feel pain again around a week ago. No new concerns.      Past Medical History:  Diagnosis Date   Allergy    Arthritis    Asthma    Childhood   Diabetes mellitus    Diverticulitis    Diverticulosis    Gout    Gout    Hyperlipidemia    Obesity, Class III, BMI 40-49.9 (morbid obesity) (HCC)    No past surgical history on file. Social History   Socioeconomic History   Marital status: Married    Spouse name: Not on file   Number of children: 2   Years of education: 18   Highest education level: Not on file  Occupational History   Occupation: Retired  Tobacco Use   Smoking status: Never   Smokeless tobacco: Never  Vaping Use   Vaping status: Never Used  Substance and Sexual Activity   Alcohol use: Yes    Alcohol/week: 2.0 standard drinks of alcohol    Types: 2 Cans of beer per week   Drug use: No   Sexual activity: Not on file  Other Topics Concern   Not on file  Social History Narrative   Fun: Watch sports, gardening    Social Determinants of Health   Financial Resource Strain: Low Risk  (06/01/2021)   Overall Financial Resource Strain (CARDIA)    Difficulty of Paying  Living Expenses: Not hard at all  Food Insecurity: No Food Insecurity (06/09/2022)   Hunger Vital Sign    Worried About Running Out of Food in the Last Year: Never true    Ran Out of Food in the Last Year: Never true  Transportation Needs: No Transportation Needs (10/22/2022)   PRAPARE - Administrator, Civil Service (Medical): No    Lack of Transportation (Non-Medical): No  Physical Activity: Sufficiently Active (06/09/2022)   Exercise Vital Sign    Days of Exercise per Week: 3 days    Minutes of Exercise per Session: 50 min  Stress: No Stress Concern Present (06/09/2022)   [employer] of Occupational Health - Occupational Stress Questionnaire    Feeling of Stress : Not at all  Social Connections: Socially Integrated (06/09/2022)   Social Connection and Isolation Panel [NHANES]    Frequency of Communication with Friends and Family: Twice a week    Frequency of Social Gatherings with Friends and Family: Once a week    Attends Religious Services: 1 to 4 times per year    Active Member of [employer] or Organizations: Yes    Attends Banker Meetings: Patient declined    Marital Status: Married   No Known Allergies Family History  Problem Relation Age of Onset   Diabetes Brother    Hypertension Brother    Diabetes Brother    Hypertension Brother    Diabetes Father    Hypertension Father    Diabetes Sister    Asthma Mother    Heart attack Maternal Grandfather     Current Outpatient Medications (Endocrine & Metabolic):    metFORMIN (GLUCOPHAGE) 1000 MG tablet, TAKE 1 TABLET BY MOUTH TWICE A DAY   tirzepatide (MOUNJARO) 2.5 MG/0.5ML Pen, Inject 2.5 mg into the skin once a week. E11.9 (Patient not taking: Reported on 12/15/2022)  Current Outpatient Medications (Cardiovascular):    rosuvastatin (CRESTOR) 40 MG tablet, TAKE 1 TABLET BY MOUTH EVERY DAY   lisinopril (ZESTRIL) 10 MG tablet, Take 1 tablet (10 mg total) by mouth daily.   Current Outpatient Medications  (Analgesics):    aspirin 81 MG tablet, Take 81 mg by mouth daily.   colchicine 0.6 MG tablet, TAKE 2 TABLETS BY MOUTH AT ONSET OF FLARE AND THEN 1 TABLET 1 HOUR LATER   ibuprofen (ADVIL) 800 MG tablet, Take 1 tablet (800 mg total) by mouth every 6 (six) hours as needed.  Current Outpatient Medications (Hematological):    vitamin B-12 (CYANOCOBALAMIN) 1000 MCG tablet, Take 1 tablet (1,000 mcg total) by mouth daily.    Reviewed prior external information including notes and imaging from  primary care provider As well as notes that were available from care everywhere and other healthcare systems.  Past medical history, social, surgical and family history all reviewed in electronic medical record.  No pertanent information unless stated regarding to the chief complaint.   Review of Systems:  No headache, visual changes, nausea, vomiting, diarrhea, constipation, dizziness, abdominal pain, skin rash, fevers, chills, night sweats, weight loss, swollen lymph nodes, body aches, joint swelling, chest pain, shortness of breath, mood changes. POSITIVE muscle aches  Objective  Blood pressure 118/70, pulse 86, height 5\' 9"  (1.753 m), weight 250 lb (113.4 kg), SpO2 98%.   General: No apparent distress alert and oriented x3 mood and affect normal, dressed appropriately.  HEENT: Pupils equal, extraocular movements intact  Respiratory: Patient's speak in full sentences and does not appear short of breath  Cardiovascular: No lower extremity edema, non tender, no erythema  Antalgic gait noted.  Favoring the right hip.  Relatively good range of motion though still noted.  Does have some limitation in internal range of motion but consistent with the contralateral side   Procedure: Real-time Ultrasound Guided Injection of right greater trochanteric bursitis secondary to patient's body habitus Device: GE Logiq Q7 Ultrasound guided injection is preferred based studies that show increased duration, increased  effect, greater accuracy, decreased procedural pain, increased response rate, and decreased cost with ultrasound guided versus blind injection.  Verbal informed consent obtained.  Time-out conducted.  Noted no overlying erythema, induration, or other signs of local infection.  Skin prepped in a sterile fashion.  Local anesthesia: Topical Ethyl chloride.  With sterile technique and under real time ultrasound guidance:  Greater trochanteric area was visualized and patient's bursa was noted. A 22-gauge 3 inch needle was inserted and 4 cc of 0.5% Marcaine and 1 cc of Kenalog 40 mg/dL was injected. Pictures taken Completed without difficulty  Pain immediately resolved suggesting accurate placement of the medication.  Advised to call if fevers/chills, erythema, induration, drainage, or persistent bleeding.  Impression: Technically successful ultrasound guided injection.    Impression and Recommendations:      The above documentation has been reviewed  and is accurate and complete Judi Saa, DO

## 2023-01-25 ENCOUNTER — Ambulatory Visit: Payer: PPO | Admitting: Family Medicine

## 2023-01-25 ENCOUNTER — Encounter: Payer: Self-pay | Admitting: Family Medicine

## 2023-01-25 VITALS — BP 118/70 | HR 86 | Ht 69.0 in | Wt 250.0 lb

## 2023-01-25 DIAGNOSIS — M7061 Trochanteric bursitis, right hip: Secondary | ICD-10-CM

## 2023-01-25 NOTE — Patient Instructions (Signed)
Good to see you! Voltaren and Ice when needed See you again in 2-3 months

## 2023-01-25 NOTE — Assessment & Plan Note (Signed)
Patient is making improvement but will do 1 more injection in this area.  Hopeful that this will make improvement.  Has done well with the weight loss and seems to be stable at this time.  We discussed with patient to continue to be active otherwise.  Continue to work on hip abductor strengthening.  Lumbar radiculopathy is within the differential but highly unlikely.  Follow-up again in 2 to 3 months

## 2023-01-31 DIAGNOSIS — E119 Type 2 diabetes mellitus without complications: Secondary | ICD-10-CM | POA: Diagnosis not present

## 2023-01-31 LAB — HM DIABETES EYE EXAM

## 2023-02-20 ENCOUNTER — Other Ambulatory Visit: Payer: Self-pay | Admitting: Internal Medicine

## 2023-02-21 ENCOUNTER — Other Ambulatory Visit: Payer: Self-pay

## 2023-06-15 ENCOUNTER — Ambulatory Visit (INDEPENDENT_AMBULATORY_CARE_PROVIDER_SITE_OTHER): Payer: PPO | Admitting: Internal Medicine

## 2023-06-15 ENCOUNTER — Encounter: Payer: Self-pay | Admitting: Internal Medicine

## 2023-06-15 VITALS — BP 122/80 | HR 67 | Temp 97.8°F | Ht 69.0 in | Wt 247.0 lb

## 2023-06-15 DIAGNOSIS — Z0001 Encounter for general adult medical examination with abnormal findings: Secondary | ICD-10-CM

## 2023-06-15 DIAGNOSIS — E1165 Type 2 diabetes mellitus with hyperglycemia: Secondary | ICD-10-CM

## 2023-06-15 DIAGNOSIS — N1831 Chronic kidney disease, stage 3a: Secondary | ICD-10-CM | POA: Diagnosis not present

## 2023-06-15 DIAGNOSIS — E559 Vitamin D deficiency, unspecified: Secondary | ICD-10-CM | POA: Diagnosis not present

## 2023-06-15 DIAGNOSIS — Z7984 Long term (current) use of oral hypoglycemic drugs: Secondary | ICD-10-CM

## 2023-06-15 DIAGNOSIS — E538 Deficiency of other specified B group vitamins: Secondary | ICD-10-CM | POA: Diagnosis not present

## 2023-06-15 DIAGNOSIS — Z125 Encounter for screening for malignant neoplasm of prostate: Secondary | ICD-10-CM | POA: Diagnosis not present

## 2023-06-15 DIAGNOSIS — I10 Essential (primary) hypertension: Secondary | ICD-10-CM

## 2023-06-15 DIAGNOSIS — Z23 Encounter for immunization: Secondary | ICD-10-CM

## 2023-06-15 LAB — LIPID PANEL
Cholesterol: 129 mg/dL (ref 0–200)
HDL: 59.4 mg/dL (ref >39.00)
LDL Cholesterol: 55 mg/dL (ref 0–99)
NonHDL: 69.88
Total CHOL/HDL Ratio: 2
Triglycerides: 73 mg/dL (ref 0.0–149.0)
VLDL: 14.6 mg/dL (ref 0.0–40.0)

## 2023-06-15 LAB — CBC WITH DIFFERENTIAL/PLATELET
Basophils Absolute: 0 10*3/uL (ref 0.0–0.1)
Basophils Relative: 0.6 % (ref 0.0–3.0)
Eosinophils Absolute: 0.1 10*3/uL (ref 0.0–0.7)
Eosinophils Relative: 2 % (ref 0.0–5.0)
HCT: 44.6 % (ref 39.0–52.0)
Hemoglobin: 14.8 g/dL (ref 13.0–17.0)
Lymphocytes Relative: 33.6 % (ref 12.0–46.0)
Lymphs Abs: 2.4 10*3/uL (ref 0.7–4.0)
MCHC: 33 g/dL (ref 30.0–36.0)
MCV: 94.1 fl (ref 78.0–100.0)
Monocytes Absolute: 0.6 10*3/uL (ref 0.1–1.0)
Monocytes Relative: 8.5 % (ref 3.0–12.0)
Neutro Abs: 4 10*3/uL (ref 1.4–7.7)
Neutrophils Relative %: 55.3 % (ref 43.0–77.0)
Platelets: 191 10*3/uL (ref 150.0–400.0)
RBC: 4.75 Mil/uL (ref 4.22–5.81)
RDW: 13.9 % (ref 11.5–15.5)
WBC: 7.3 10*3/uL (ref 4.0–10.5)

## 2023-06-15 LAB — URINALYSIS, ROUTINE W REFLEX MICROSCOPIC
Bilirubin Urine: NEGATIVE
Hgb urine dipstick: NEGATIVE
Ketones, ur: NEGATIVE
Leukocytes,Ua: NEGATIVE
Nitrite: NEGATIVE
RBC / HPF: NONE SEEN (ref 0–?)
Specific Gravity, Urine: 1.02 (ref 1.000–1.030)
Total Protein, Urine: NEGATIVE
Urine Glucose: NEGATIVE
Urobilinogen, UA: 0.2 (ref 0.0–1.0)
pH: 6 (ref 5.0–8.0)

## 2023-06-15 LAB — MICROALBUMIN / CREATININE URINE RATIO
Creatinine,U: 92.2 mg/dL
Microalb Creat Ratio: 28.6 mg/g (ref 0.0–30.0)
Microalb, Ur: 2.6 mg/dL — ABNORMAL HIGH (ref 0.0–1.9)

## 2023-06-15 LAB — HEPATIC FUNCTION PANEL
ALT: 21 U/L (ref 0–53)
AST: 21 U/L (ref 0–37)
Albumin: 4.5 g/dL (ref 3.5–5.2)
Alkaline Phosphatase: 69 U/L (ref 39–117)
Bilirubin, Direct: 0.2 mg/dL (ref 0.0–0.3)
Total Bilirubin: 0.7 mg/dL (ref 0.2–1.2)
Total Protein: 7 g/dL (ref 6.0–8.3)

## 2023-06-15 LAB — VITAMIN B12: Vitamin B-12: 143 pg/mL — ABNORMAL LOW (ref 211–911)

## 2023-06-15 LAB — BASIC METABOLIC PANEL
BUN: 19 mg/dL (ref 6–23)
CO2: 22 meq/L (ref 19–32)
Calcium: 10.1 mg/dL (ref 8.4–10.5)
Chloride: 106 meq/L (ref 96–112)
Creatinine, Ser: 1.27 mg/dL (ref 0.40–1.50)
GFR: 55.98 mL/min — ABNORMAL LOW (ref >60.00)
Glucose, Bld: 83 mg/dL (ref 70–99)
Potassium: 4.5 meq/L (ref 3.5–5.1)
Sodium: 140 meq/L (ref 135–145)

## 2023-06-15 LAB — TSH: TSH: 1.35 u[IU]/mL (ref 0.35–5.50)

## 2023-06-15 LAB — HEMOGLOBIN A1C: Hgb A1c MFr Bld: 6 % (ref 4.6–6.5)

## 2023-06-15 LAB — PSA: PSA: 0.29 ng/mL (ref 0.10–4.00)

## 2023-06-15 LAB — VITAMIN D 25 HYDROXY (VIT D DEFICIENCY, FRACTURES): VITD: 19.58 ng/mL — ABNORMAL LOW (ref 30.00–100.00)

## 2023-06-15 MED ORDER — TIRZEPATIDE 2.5 MG/0.5ML ~~LOC~~ SOAJ: 2.5000 mg | SUBCUTANEOUS | 11 refills | Status: DC

## 2023-06-15 NOTE — Patient Instructions (Addendum)
 MyChart Username [facility]   and your PW is your wife's name  Please take all new medication as prescribed - the Mounjaro 2.5 mg weekly  Please call or send Mychart Message if you would want to increase the [facility] after 1 month  You had the Shingles shot #2 today  Please continue all other medications as before, and refills have been done if requested.  Please have the pharmacy call with any other refills you may need.  Please continue your efforts at being more active, low cholesterol diet, and weight control.  You are otherwise up to date with prevention measures today.  Please keep your appointments with your specialists as you may have planned  Please go to the LAB at the blood drawing area for the tests to be done  You will be contacted by phone if any changes need to be made immediately.  Otherwise, you will receive a letter about your results with an explanation, but please check with MyChart first.  Please make an Appointment to return in 6 months, or sooner if needed

## 2023-06-15 NOTE — Progress Notes (Signed)
 Patient ID: Roger Alvarado, male   DOB: Dec 25, 1950, 73 y.o.   MRN: [id number]         Chief Complaint:: wellness exam and dm, low b12, ckd3a, htn, low vit d       HPI:  Roger Alvarado is a 73 y.o. male here for wellness exam; due for shingles #2 today, o/w up to date                        Also peak wt has been 319 in past, now walking 3 miles x 3 times per wk with good wt loss.  Pt denies chest pain, increased sob or doe, wheezing, orthopnea, PND, increased LE swelling, palpitations, dizziness or syncope.   Pt denies polydipsia, polyuria, or new focal neuro s/s.    Pt denies fever, wt loss, night sweats, loss of appetite, or other constitutional symptoms     Wt Readings from Last 3 Encounters:  06/15/23 247 lb (112 kg)  01/25/23 250 lb (113.4 kg)  12/15/22 249 lb (112.9 kg)   BP Readings from Last 3 Encounters:  06/15/23 122/80  01/25/23 118/70  12/15/22 130/82   Immunization History  Administered Date(s) Administered   Fluad Quad(high Dose 65+) 12/13/2018, 02/04/2022   Fluad Trivalent(High Dose 65+) 02/01/2023   Influenza, High Dose Seasonal PF 12/29/2016, 01/03/2018   Influenza-Unspecified 01/17/2020, 12/31/2020   Moderna Covid-19 Fall Seasonal Vaccine 22yrs & older 02/01/2023   PFIZER Comirnaty(Gray Top)Covid-19 Tri-Sucrose Vaccine 02/04/2022   PFIZER(Purple Top)SARS-COV-2 Vaccination 04/23/2019, 05/18/2019, 01/17/2020   Pfizer Covid-19 Vaccine Bivalent Booster 3yrs & up 12/31/2020   Pneumococcal Conjugate-13 08/28/2015   Pneumococcal Polysaccharide-23 09/03/2016   Tdap 08/28/2015   Zoster Recombinant(Shingrix) 12/15/2022, 06/15/2023   Health Maintenance Due  Topic Date Due   Medicare Annual Wellness (AWV)  06/09/2023      Past Medical History:  Diagnosis Date   Allergy    Arthritis    Asthma    Childhood   Diabetes mellitus    Diverticulitis    Diverticulosis    Gout    Gout    Hyperlipidemia    Obesity, Class III, BMI 40-49.9 (morbid obesity) (HCC)     History reviewed. No pertinent surgical history.  reports that he has never smoked. He has never used smokeless tobacco. He reports current alcohol use of about 2.0 standard drinks of alcohol per week. He reports that he does not use drugs. family history includes Asthma in his mother; Diabetes in his brother, brother, father, and sister; Heart attack in his maternal grandfather; Hypertension in his brother, brother, and father. No Known Allergies Current Outpatient Medications on File Prior to Visit  Medication Sig Dispense Refill   aspirin 81 MG tablet Take 81 mg by mouth daily.     colchicine 0.6 MG tablet TAKE 2 TABLETS BY MOUTH AT ONSET OF FLARE AND THEN 1 TABLET 1 HOUR LATER 180 tablet 1   ibuprofen (ADVIL) 800 MG tablet Take 1 tablet (800 mg total) by mouth every 6 (six) hours as needed. 60 tablet 1   lisinopril (ZESTRIL) 10 MG tablet TAKE 1 TABLET BY MOUTH EVERY DAY 90 tablet 3   metFORMIN (GLUCOPHAGE) 1000 MG tablet TAKE 1 TABLET BY MOUTH TWICE A DAY 180 tablet 3   rosuvastatin (CRESTOR) 40 MG tablet TAKE 1 TABLET BY MOUTH EVERY DAY 90 tablet 3   vitamin B-12 (CYANOCOBALAMIN) 1000 MCG tablet Take 1 tablet (1,000 mcg total) by mouth daily. 90 tablet 3  No current facility-administered medications on file prior to visit.        ROS:  All others reviewed and negative.  Objective        PE:  BP 122/80 (BP Location: Left Arm, Patient Position: Sitting, Cuff Size: Normal)   Pulse 67   Temp 97.8 F (36.6 C) (Oral)   Ht 5\' 9"  (1.753 m)   Wt 247 lb (112 kg)   SpO2 98%   BMI 36.48 kg/m                 Constitutional: Pt appears in NAD               HENT: Head: NCAT.                Right Ear: External ear normal.                 Left Ear: External ear normal.                Eyes: . Pupils are equal, round, and reactive to light. Conjunctivae and EOM are normal               Nose: without d/c or deformity               Neck: Neck supple. Gross normal ROM                Cardiovascular: Normal rate and regular rhythm.                 Pulmonary/Chest: Effort normal and breath sounds without rales or wheezing.                Abd:  Soft, NT, ND, + BS, no organomegaly               Neurological: Pt is alert. At baseline orientation, motor grossly intact               Skin: Skin is warm. No rashes, no other new lesions, LE edema - none               Psychiatric: Pt behavior is normal without agitation   Micro: none  Cardiac tracings I have personally interpreted today:  none  Pertinent Radiological findings (summarize): none   Lab Results  Component Value Date   WBC 7.3 06/15/2023   HGB 14.8 06/15/2023   HCT 44.6 06/15/2023   PLT 191.0 06/15/2023   GLUCOSE 83 06/15/2023   CHOL 129 06/15/2023   TRIG 73.0 06/15/2023   HDL 59.40 06/15/2023   LDLCALC 55 06/15/2023   ALT 21 06/15/2023   AST 21 06/15/2023   NA 140 06/15/2023   K 4.5 06/15/2023   CL 106 06/15/2023   CREATININE 1.27 06/15/2023   BUN 19 06/15/2023   CO2 22 06/15/2023   TSH 1.35 06/15/2023   PSA 0.29 06/15/2023   INR 1.1 (H) 08/15/2020   HGBA1C 6.0 06/15/2023   MICROALBUR 2.6 (H) 06/15/2023   Assessment/Plan:  Roger Alvarado is a 73 y.o. Black or African American [2] male with  has a past medical history of Allergy, Arthritis, Asthma, Diabetes mellitus, Diverticulitis, Diverticulosis, Gout, Gout, Hyperlipidemia, and Obesity, Class III, BMI 40-49.9 (morbid obesity) (HCC).  Encounter for well adult exam with abnormal findings Age and sex appropriate education and counseling updated with regular exercise and diet Referrals for preventative services - none needed Immunizations addressed - none needed Smoking counseling  - none needed Evidence for depression  or other mood disorder - none significant Most recent labs reviewed. I have personally reviewed and have noted: 1) the patient's medical and social history 2) The patient's current medications and supplements 3) The patient's  height, weight, and BMI have been recorded in the chart   B12 deficiency Lab Results  Component Value Date   VITAMINB12 143 (L) 06/15/2023   Low, to start oral replacement - b12 1000 mcg qd   CKD (chronic kidney disease) stage 3, GFR 30-59 ml/min (HCC) Lab Results  Component Value Date   CREATININE 1.27 06/15/2023   Stable overall, cont to avoid nephrotoxins  HTN (hypertension) BP Readings from Last 3 Encounters:  06/15/23 122/80  01/25/23 118/70  12/15/22 130/82   Stable, pt to continue medical treatment lisinopril 10 qd   Type 2 diabetes mellitus (HCC) Lab Results  Component Value Date   HGBA1C 6.0 06/15/2023   With uncontrolled obesity, pt to continue current medical treatment metformin 1000 bid, also add mounjaro 2.5 mg weekly with intent to titrate   Vitamin D deficiency Last vitamin D Lab Results  Component Value Date   VD25OH 19.58 (L) 06/15/2023   Low, to start oral replacement  Followup: Return in about 6 months (around 12/16/2023).  Oliver Barre, MD 06/18/2023 6:54 PM [facility]

## 2023-06-16 ENCOUNTER — Encounter: Payer: Self-pay | Admitting: Internal Medicine

## 2023-06-18 ENCOUNTER — Encounter: Payer: Self-pay | Admitting: Internal Medicine

## 2023-06-18 NOTE — Assessment & Plan Note (Signed)
 Lab Results  Component Value Date   VITAMINB12 143 (L) 06/15/2023   Low, to start oral replacement - b12 1000 mcg qd

## 2023-06-18 NOTE — Assessment & Plan Note (Signed)
 BP Readings from Last 3 Encounters:  06/15/23 122/80  01/25/23 118/70  12/15/22 130/82   Stable, pt to continue medical treatment lisinopril 10 qd

## 2023-06-18 NOTE — Assessment & Plan Note (Signed)
 Lab Results  Component Value Date   HGBA1C 6.0 06/15/2023   With uncontrolled obesity, pt to continue current medical treatment metformin 1000 bid, also add mounjaro 2.5 mg weekly with intent to titrate

## 2023-06-18 NOTE — Assessment & Plan Note (Signed)
 Last vitamin D Lab Results  Component Value Date   VD25OH 19.58 (L) 06/15/2023   Low, to start oral replacement

## 2023-06-18 NOTE — Assessment & Plan Note (Signed)

## 2023-06-18 NOTE — Assessment & Plan Note (Signed)
 Lab Results  Component Value Date   CREATININE 1.27 06/15/2023   Stable overall, cont to avoid nephrotoxins

## 2023-07-15 ENCOUNTER — Ambulatory Visit

## 2023-07-15 VITALS — BP 120/81 | HR 73 | Ht 68.5 in | Wt 242.0 lb

## 2023-07-15 DIAGNOSIS — Z0000 Encounter for general adult medical examination without abnormal findings: Secondary | ICD-10-CM | POA: Diagnosis not present

## 2023-07-15 NOTE — Patient Instructions (Signed)
 Roger Alvarado , Thank you for taking time to come for your Medicare Wellness Visit. I appreciate your ongoing commitment to your health goals. Please review the following plan we discussed and let me know if I can assist you in the future.   Referrals/Orders/Follow-Ups/Clinician Recommendations: It was nice meeting you today.  Keep up the good work.  This is a list of the screening recommended for you and due dates:  Health Maintenance  Topic Date Due   Medicare Annual Wellness Visit  06/09/2023   COVID-19 Vaccine (7 - 2024-25 season) 08/02/2023   Flu Shot  11/04/2023   Hemoglobin A1C  12/16/2023   Eye exam for diabetics  01/31/2024   Yearly kidney function blood test for diabetes  06/14/2024   Yearly kidney health urinalysis for diabetes  06/14/2024   Complete foot exam   06/14/2024   DTaP/Tdap/Td vaccine (2 - Td or Tdap) 08/27/2025   Colon Cancer Screening  06/02/2026   Pneumonia Vaccine  Completed   Hepatitis C Screening  Completed   Zoster (Shingles) Vaccine  Completed   HPV Vaccine  Aged Out   Meningitis B Vaccine  Aged Out    Advanced directives: (Declined) Advance directive discussed with you today. Even though you declined this today, please call our office should you change your mind, and we can give you the proper paperwork for you to fill out.  Next Medicare Annual Wellness Visit scheduled for next year: Yes

## 2023-07-15 NOTE — Progress Notes (Signed)
 Subjective:   Roger Alvarado is a 73 y.o. who presents for a Medicare Wellness preventive visit.  Visit Complete: In person   Persons Participating in Visit: Patient.  AWV Questionnaire: No: Patient Medicare AWV questionnaire was not completed prior to this visit.  Cardiac Risk Factors include: advanced age (>3men, >14 women);hypertension;male gender;diabetes mellitus;Other (see comment), Risk factor comments: Fatty liver, CKD     Objective:    Today's Vitals   07/15/23 1118 07/15/23 1126  Weight:  242 lb (109.8 kg)  Height:  5' 8.5" (1.74 m)  PainSc: 5     Body mass index is 36.26 kg/m.     07/15/2023   11:33 AM 06/09/2022    2:53 PM 06/01/2021    4:03 PM 05/21/2020    1:40 PM 03/24/2017    8:05 AM 08/20/2016   12:00 PM  Advanced Directives  Does Patient Have a Medical Advance Directive? No No No No No No  Would patient like information on creating a medical advance directive?  No - Patient declined No - Patient declined No - Patient declined No - Patient declined     Current Medications (verified) Outpatient Encounter Medications as of 07/15/2023  Medication Sig   aspirin 81 MG tablet Take 81 mg by mouth daily.   colchicine 0.6 MG tablet TAKE 2 TABLETS BY MOUTH AT ONSET OF FLARE AND THEN 1 TABLET 1 HOUR LATER   ibuprofen (ADVIL) 800 MG tablet Take 1 tablet (800 mg total) by mouth every 6 (six) hours as needed.   lisinopril (ZESTRIL) 10 MG tablet TAKE 1 TABLET BY MOUTH EVERY DAY   metFORMIN (GLUCOPHAGE) 1000 MG tablet TAKE 1 TABLET BY MOUTH TWICE A DAY   rosuvastatin (CRESTOR) 40 MG tablet TAKE 1 TABLET BY MOUTH EVERY DAY   vitamin B-12 (CYANOCOBALAMIN) 1000 MCG tablet Take 1 tablet (1,000 mcg total) by mouth daily.   tirzepatide [facility]) 2.5 MG/0.5ML Pen Inject 2.5 mg into the skin once a week. E11.9 (Patient not taking: Reported on 07/15/2023)   No facility-administered encounter medications on file as of 07/15/2023.    Allergies (verified) Patient has no known  allergies.   History: Past Medical History:  Diagnosis Date   Allergy    Arthritis    Asthma    Childhood   Diabetes mellitus    Diverticulitis    Diverticulosis    Gout    Gout    Hyperlipidemia    Obesity, Class III, BMI 40-49.9 (morbid obesity) (HCC)    History reviewed. No pertinent surgical history. Family History  Problem Relation Age of Onset   Diabetes Brother    Hypertension Brother    Diabetes Brother    Hypertension Brother    Diabetes Father    Hypertension Father    Diabetes Sister    Asthma Mother    Heart attack Maternal Grandfather    Social History   Socioeconomic History   Marital status: Married    Spouse name: Not on file   Number of children: 2   Years of education: 18   Highest education level: Not on file  Occupational History   Occupation: Retired  Tobacco Use   Smoking status: Never   Smokeless tobacco: Never  Vaping Use   Vaping status: Never Used  Substance and Sexual Activity   Alcohol use: Yes    Alcohol/week: 2.0 standard drinks of alcohol    Types: 2 Cans of beer per week   Drug use: No   Sexual activity: Not  on file  Other Topics Concern   Not on file  Social History Narrative   Fun: Watch sports, gardening    Lives with wife   Social Drivers of Health   Financial Resource Strain: Low Risk  (06/01/2021)   Overall Financial Resource Strain (CARDIA)    Difficulty of Paying Living Expenses: Not hard at all  Food Insecurity: No Food Insecurity (06/09/2022)   Hunger Vital Sign    Worried About Running Out of Food in the Last Year: Never true    Ran Out of Food in the Last Year: Never true  Transportation Needs: No Transportation Needs (10/22/2022)   PRAPARE - Administrator, Civil Service (Medical): No    Lack of Transportation (Non-Medical): No  Physical Activity: Sufficiently Active (07/15/2023)   Exercise Vital Sign    Days of Exercise per Week: 3 days    Minutes of Exercise per Session: 50 min  Stress: No  Stress Concern Present (07/15/2023)   [employer] of Occupational Health - Occupational Stress Questionnaire    Feeling of Stress : Not at all  Social Connections: Moderately Isolated (07/15/2023)   Social Connection and Isolation Panel [NHANES]    Frequency of Communication with Friends and Family: Three times a week    Frequency of Social Gatherings with Friends and Family: Once a week    Attends Religious Services: Never    Database administrator or Organizations: No    Attends Engineer, structural: Never    Marital Status: Married    Tobacco Counseling Counseling given: Not Answered    Clinical Intake:  Pre-visit preparation completed: Yes  Pain : 0-10 Pain Score: 5  Pain Type: Acute pain Pain Location: Foot (gout flare) Pain Orientation: Right Pain Descriptors / Indicators: Aching Pain Onset: In the past 7 days Pain Frequency: Constant     BMI - recorded: 36.26 Diabetes: Yes Did pt. bring in CBG monitor from home?: No  Lab Results  Component Value Date   HGBA1C 6.0 06/15/2023   HGBA1C 6.0 12/15/2022   HGBA1C 6.0 06/09/2022     How often do you need to have someone help you when you read instructions, pamphlets, or other written materials from your doctor or pharmacy?: 1 - Never  Interpreter Needed?: No  Information entered by :: Citlalli Weikel, RMA   Activities of Daily Living     07/15/2023   11:18 AM  In your present state of health, do you have any difficulty performing the following activities:  Hearing? 0  Vision? 0  Difficulty concentrating or making decisions? 0  Walking or climbing stairs? 0  Dressing or bathing? 0  Doing errands, shopping? 0  Preparing Food and eating ? N  Using the Toilet? N  In the past six months, have you accidently leaked urine? N  Do you have problems with loss of bowel control? N  Managing your Medications? N  Managing your Finances? N  Housekeeping or managing your Housekeeping? N    Patient Care  Team: Corwin Levins, MD as PCP - General (Internal Medicine) Himmelrich, Loree Fee, RD (Inactive) as Dietitian Pa, [facility] as Consulting Physician (Ophthalmology) Sinda Du, MD as Consulting Physician (Ophthalmology)  Indicate any recent Medical Services you may have received from other than [facility] providers in the past year (date may be approximate).     Assessment:   This is a routine wellness examination for Joselito.  Hearing/Vision screen Hearing Screening - Comments:: Denies hearing difficulties  Vision Screening - Comments:: Denies vision issues.    Goals Addressed               This Visit's Progress     My goal for 2024 is to lose weight.  I would like to lose 40-50 pounds.   On track     Patient Stated (pt-stated)        New goal to get down 45 more lbs/2025       Depression Screen     06/15/2023    1:37 PM 12/15/2022    1:27 PM 06/09/2022    3:23 PM 06/09/2022    2:51 PM 03/24/2022    3:04 PM 12/09/2021   11:11 AM 06/03/2021   10:53 AM  PHQ 2/9 Scores  PHQ - 2 Score 0 0 0 0 0 0 0  PHQ- 9 Score   0 0 0 2     Fall Risk     07/15/2023   11:33 AM 06/15/2023    1:46 PM 12/15/2022    1:27 PM 06/09/2022    3:22 PM 06/09/2022    2:54 PM  Fall Risk   Falls in the past year? 0 0 0 0 0  Number falls in past yr: 0 0 0 0 0  Injury with Fall? 0 0 0 0 0  Risk for fall due to :  No Fall Risks No Fall Risks No Fall Risks No Fall Risks  Follow up Falls evaluation completed;Falls prevention discussed Falls evaluation completed Falls evaluation completed Falls evaluation completed Falls prevention discussed    MEDICARE RISK AT HOME:  Medicare Risk at Home Any stairs in or around the home?: Yes (outside coming in the house) If so, are there any without handrails?: Yes Home free of loose throw rugs in walkways, pet beds, electrical cords, etc?: Yes Adequate lighting in your home to reduce risk of falls?: Yes Use of a cane, walker or w/c?: No Grab bars  in the bathroom?: Yes Shower chair or bench in shower?: No Elevated toilet seat or a handicapped toilet?: No  TIMED UP AND GO:  Was the test performed?  Yes  Length of time to ambulate 10 feet: 15 sec Gait slow and steady without use of assistive device  Cognitive Function: Normal: Normal cognitive status assessed by direct observation by this Clinical Health Advisor. No abnormalities found. Patient is able to answer questions in an accurate and timely manner.        06/09/2022    2:54 PM  6CIT Screen  What Year? 0 points  What month? 0 points  What time? 0 points  Count back from 20 0 points  Months in reverse 0 points  Repeat phrase 0 points  Total Score 0 points    Immunizations Immunization History  Administered Date(s) Administered   Fluad Quad(high Dose 65+) 12/13/2018, 02/04/2022   Fluad Trivalent(High Dose 65+) 02/01/2023   Influenza, High Dose Seasonal PF 12/29/2016, 01/03/2018   Influenza-Unspecified 01/17/2020, 12/31/2020   Moderna Covid-19 Fall Seasonal Vaccine 62yrs & older 02/01/2023   PFIZER Comirnaty(Gray Top)Covid-19 Tri-Sucrose Vaccine 02/04/2022   PFIZER(Purple Top)SARS-COV-2 Vaccination 04/23/2019, 05/18/2019, 01/17/2020   Pfizer Covid-19 Vaccine Bivalent Booster 57yrs & up 12/31/2020   Pneumococcal Conjugate-13 08/28/2015   Pneumococcal Polysaccharide-23 09/03/2016   Tdap 08/28/2015   Zoster Recombinant(Shingrix) 12/15/2022, 06/15/2023    Screening Tests Health Maintenance  Topic Date Due   Medicare Annual Wellness (AWV)  06/09/2023   COVID-19 Vaccine (7 - 2024-25 season) 08/02/2023   INFLUENZA VACCINE  11/04/2023   HEMOGLOBIN A1C  12/16/2023   OPHTHALMOLOGY EXAM  01/31/2024   Diabetic kidney evaluation - eGFR measurement  06/14/2024   Diabetic kidney evaluation - Urine ACR  06/14/2024   FOOT EXAM  06/14/2024   DTaP/Tdap/Td (2 - Td or Tdap) 08/27/2025   Colonoscopy  06/02/2026   Pneumonia Vaccine 36+ Years old  Completed   Hepatitis C  Screening  Completed   Zoster Vaccines- Shingrix  Completed   HPV VACCINES  Aged Out   Meningococcal B Vaccine  Aged Out    Health Maintenance  Health Maintenance Due  Topic Date Due   Medicare Annual Wellness (AWV)  06/09/2023   Health Maintenance Items Addressed: See Nurse Notes  Additional Screening:  Vision Screening: Recommended annual ophthalmology exams for early detection of glaucoma and other disorders of the eye.  Dental Screening: Recommended annual dental exams for proper oral hygiene  Community Resource Referral / Chronic Care Management: CRR required this visit?  No   CCM required this visit?  No     Plan:     I have personally reviewed and noted the following in the patient's chart:   Medical and social history Use of alcohol, tobacco or illicit drugs  Current medications and supplements including opioid prescriptions. Patient is not currently taking opioid prescriptions. Functional ability and status Nutritional status Physical activity Advanced directives List of other physicians Hospitalizations, surgeries, and ER visits in previous 12 months Vitals Screenings to include cognitive, depression, and falls Referrals and appointments  In addition, I have reviewed and discussed with patient certain preventive protocols, quality metrics, and best practice recommendations. A written personalized care plan for preventive services as well as general preventive health recommendations were provided to patient.     Jarvis Sawa L Almarosa Bohac, CMA   07/15/2023   After Visit Summary: (In Person-Declined) Patient declined AVS at this time.  Notes: Please refer to Routing Comments.

## 2023-08-15 ENCOUNTER — Encounter: Payer: Self-pay | Admitting: Internal Medicine

## 2023-08-15 ENCOUNTER — Telehealth: Payer: Self-pay | Admitting: Internal Medicine

## 2023-08-15 ENCOUNTER — Ambulatory Visit: Payer: Self-pay

## 2023-08-15 ENCOUNTER — Ambulatory Visit (INDEPENDENT_AMBULATORY_CARE_PROVIDER_SITE_OTHER)

## 2023-08-15 ENCOUNTER — Ambulatory Visit (INDEPENDENT_AMBULATORY_CARE_PROVIDER_SITE_OTHER): Admitting: Internal Medicine

## 2023-08-15 VITALS — BP 118/70 | HR 66 | Temp 97.9°F | Resp 16 | Ht 68.5 in | Wt 238.0 lb

## 2023-08-15 DIAGNOSIS — M1009 Idiopathic gout, multiple sites: Secondary | ICD-10-CM | POA: Diagnosis not present

## 2023-08-15 DIAGNOSIS — R109 Unspecified abdominal pain: Secondary | ICD-10-CM | POA: Diagnosis not present

## 2023-08-15 DIAGNOSIS — R195 Other fecal abnormalities: Secondary | ICD-10-CM | POA: Insufficient documentation

## 2023-08-15 DIAGNOSIS — R634 Abnormal weight loss: Secondary | ICD-10-CM | POA: Diagnosis not present

## 2023-08-15 DIAGNOSIS — R10817 Generalized abdominal tenderness: Secondary | ICD-10-CM

## 2023-08-15 DIAGNOSIS — R300 Dysuria: Secondary | ICD-10-CM

## 2023-08-15 DIAGNOSIS — I959 Hypotension, unspecified: Secondary | ICD-10-CM | POA: Diagnosis not present

## 2023-08-15 DIAGNOSIS — N410 Acute prostatitis: Secondary | ICD-10-CM | POA: Diagnosis not present

## 2023-08-15 DIAGNOSIS — N1831 Chronic kidney disease, stage 3a: Secondary | ICD-10-CM

## 2023-08-15 DIAGNOSIS — M109 Gout, unspecified: Secondary | ICD-10-CM | POA: Diagnosis not present

## 2023-08-15 DIAGNOSIS — R748 Abnormal levels of other serum enzymes: Secondary | ICD-10-CM | POA: Diagnosis not present

## 2023-08-15 LAB — CBC WITH DIFFERENTIAL/PLATELET
Basophils Absolute: 0 10*3/uL (ref 0.0–0.1)
Basophils Relative: 0.3 % (ref 0.0–3.0)
Eosinophils Absolute: 0.2 10*3/uL (ref 0.0–0.7)
Eosinophils Relative: 2.3 % (ref 0.0–5.0)
HCT: 40.7 % (ref 39.0–52.0)
Hemoglobin: 13.5 g/dL (ref 13.0–17.0)
Lymphocytes Relative: 37.9 % (ref 12.0–46.0)
Lymphs Abs: 2.6 10*3/uL (ref 0.7–4.0)
MCHC: 33.1 g/dL (ref 30.0–36.0)
MCV: 93.7 fl (ref 78.0–100.0)
Monocytes Absolute: 1 10*3/uL (ref 0.1–1.0)
Monocytes Relative: 14.3 % — ABNORMAL HIGH (ref 3.0–12.0)
Neutro Abs: 3.1 10*3/uL (ref 1.4–7.7)
Neutrophils Relative %: 45.2 % (ref 43.0–77.0)
Platelets: 228 10*3/uL (ref 150.0–400.0)
RBC: 4.34 Mil/uL (ref 4.22–5.81)
RDW: 13.7 % (ref 11.5–15.5)
WBC: 6.9 10*3/uL (ref 4.0–10.5)

## 2023-08-15 LAB — URINALYSIS, ROUTINE W REFLEX MICROSCOPIC
Bilirubin Urine: NEGATIVE
Ketones, ur: NEGATIVE
Nitrite: POSITIVE — AB
Specific Gravity, Urine: 1.02 (ref 1.000–1.030)
Urine Glucose: NEGATIVE
Urobilinogen, UA: 0.2 (ref 0.0–1.0)
pH: 6 (ref 5.0–8.0)

## 2023-08-15 LAB — BASIC METABOLIC PANEL WITH GFR
BUN: 47 mg/dL — ABNORMAL HIGH (ref 6–23)
CO2: 21 meq/L (ref 19–32)
Calcium: 9.4 mg/dL (ref 8.4–10.5)
Chloride: 108 meq/L (ref 96–112)
Creatinine, Ser: 1.92 mg/dL — ABNORMAL HIGH (ref 0.40–1.50)
GFR: 34.05 mL/min — ABNORMAL LOW (ref >60.00)
Glucose, Bld: 124 mg/dL — ABNORMAL HIGH (ref 70–99)
Potassium: 4.3 meq/L (ref 3.5–5.1)
Sodium: 138 meq/L (ref 135–145)

## 2023-08-15 LAB — HEPATIC FUNCTION PANEL
ALT: 34 U/L (ref 0–53)
AST: 25 U/L (ref 0–37)
Albumin: 4 g/dL (ref 3.5–5.2)
Alkaline Phosphatase: 59 U/L (ref 39–117)
Bilirubin, Direct: 0.1 mg/dL (ref 0.0–0.3)
Total Bilirubin: 0.5 mg/dL (ref 0.2–1.2)
Total Protein: 7.4 g/dL (ref 6.0–8.3)

## 2023-08-15 LAB — LIPASE: Lipase: 69 U/L — ABNORMAL HIGH (ref 11.0–59.0)

## 2023-08-15 LAB — URIC ACID: Uric Acid, Serum: 8 mg/dL — ABNORMAL HIGH (ref 4.0–7.8)

## 2023-08-15 LAB — C-REACTIVE PROTEIN: CRP: 5.4 mg/dL (ref 0.5–20.0)

## 2023-08-15 MED ORDER — SULFAMETHOXAZOLE-TRIMETHOPRIM 800-160 MG PO TABS: 1.0000 | ORAL_TABLET | Freq: Two times a day (BID) | ORAL | 0 refills | Status: AC

## 2023-08-15 MED ORDER — COLCHICINE 0.6 MG PO TABS
ORAL_TABLET | ORAL | 0 refills | Status: DC
Start: 2023-08-15 — End: 2023-09-07

## 2023-08-15 NOTE — Progress Notes (Signed)
 Subjective:  Patient ID: Roger Alvarado, male    DOB: 12-05-1950  Age: 73 y.o. MRN: [id number]  CC: Dysuria (Cloudy Urine, Discomfort in the lower abdomen. Since Thursday.) and Abdominal Pain   HPI Roger Alvarado presents for abd pain.  Discussed the use of AI scribe software for clinical note transcription with the patient, who gave verbal consent to proceed.  History of Present Illness   Roger Alvarado is a 73 year old male who presents with urinary discomfort and cloudy urine.  He has been experiencing a sensation during urination that began on Thursday, accompanied by increasingly cloudy urine. There is mild discomfort in the lower abdomen, but no straining to urinate. Abdominal pain has occurred only once or twice since the onset of symptoms. No nausea, vomiting, fever, chills, chest pain, or shortness of breath.  He recalls a similar episode two years ago when he was treated with Cipro , which was later changed due to bacterial resistance. He does not remember the name of the second antibiotic.  He is currently taking Advil  for a gout flare. No weakness, dizziness, or lightheadedness.       Outpatient Medications Prior to Visit  Medication Sig Dispense Refill   aspirin 81 MG tablet Take 81 mg by mouth daily.     rosuvastatin  (CRESTOR ) 40 MG tablet TAKE 1 TABLET BY MOUTH EVERY DAY 90 tablet 3   vitamin B-12 (CYANOCOBALAMIN ) 1000 MCG tablet Take 1 tablet (1,000 mcg total) by mouth daily. 90 tablet 3   colchicine  0.6 MG tablet TAKE 2 TABLETS BY MOUTH AT ONSET OF FLARE AND THEN 1 TABLET 1 HOUR LATER 180 tablet 1   ibuprofen  (ADVIL ) 800 MG tablet Take 1 tablet (800 mg total) by mouth every 6 (six) hours as needed. 60 tablet 1   lisinopril  (ZESTRIL ) 10 MG tablet TAKE 1 TABLET BY MOUTH EVERY DAY 90 tablet 3   metFORMIN  (GLUCOPHAGE ) 1000 MG tablet TAKE 1 TABLET BY MOUTH TWICE A DAY 180 tablet 3   tirzepatide  (MOUNJARO ) 2.5 MG/0.5ML Pen Inject 2.5 mg into the skin once a week. E11.9  2 mL 11   No facility-administered medications prior to visit.    ROS Review of Systems  Constitutional:  Positive for unexpected weight change (wt loss). Negative for appetite change, chills, diaphoresis, fatigue and fever.  HENT: Negative.  Negative for trouble swallowing.   Eyes: Negative.  Negative for visual disturbance.  Respiratory:  Negative for cough, chest tightness, shortness of breath and wheezing.   Cardiovascular:  Negative for chest pain, palpitations and leg swelling.  Gastrointestinal:  Positive for abdominal pain. Negative for blood in stool, constipation, diarrhea, nausea and vomiting.  Endocrine: Negative.   Genitourinary:  Positive for dysuria. Negative for difficulty urinating, flank pain, frequency, hematuria, penile swelling, scrotal swelling, testicular pain and urgency.  Musculoskeletal:  Negative for arthralgias, back pain, myalgias and neck pain.  Skin: Negative.  Negative for color change and rash.  Neurological: Negative.  Negative for dizziness and weakness.  Hematological:  Negative for adenopathy. Does not bruise/bleed easily.  Psychiatric/Behavioral:  Positive for confusion and decreased concentration. Negative for sleep disturbance. The patient is not nervous/anxious.     Objective:  BP 118/70 (BP Location: Left Arm, Patient Position: Sitting, Cuff Size: Normal)   Pulse 66   Temp 97.9 F (36.6 C) (Oral)   Resp 16   Ht 5' 8.5" (1.74 m)   Wt 238 lb (108 kg)   SpO2 98%   BMI 35.66 kg/m  BP Readings from Last 3 Encounters:  08/15/23 118/70  07/15/23 120/81  06/15/23 122/80    Wt Readings from Last 3 Encounters:  08/15/23 238 lb (108 kg)  07/15/23 242 lb (109.8 kg)  06/15/23 247 lb (112 kg)    Physical Exam Vitals reviewed.  Constitutional:      General: He is not in acute distress.    Appearance: He is ill-appearing. He is not toxic-appearing or diaphoretic.  HENT:     Nose: Nose normal.     Mouth/Throat:     Mouth: Mucous  membranes are moist.  Eyes:     General: No scleral icterus.    Conjunctiva/sclera: Conjunctivae normal.  Cardiovascular:     Rate and Rhythm: Normal rate and regular rhythm. Occasional Extrasystoles are present.    Heart sounds: No murmur heard.    No friction rub. No gallop.     Comments: EKG-- SR with PAC's, 71 bpm No LVH, Q waves, or ST/T wave changes  No old tracings to compare Pulmonary:     Effort: Pulmonary effort is normal.     Breath sounds: No stridor. No wheezing, rhonchi or rales.  Abdominal:     General: Abdomen is protuberant. Bowel sounds are normal. There is no distension.     Palpations: There is no hepatomegaly, splenomegaly or mass.     Tenderness: There is abdominal tenderness in the right lower quadrant and left lower quadrant. There is no guarding or rebound.     Hernia: There is no hernia in the left inguinal area or right inguinal area.  Genitourinary:    Pubic Area: No rash.      Penis: Normal and uncircumcised. No erythema or discharge.      Testes: Normal.     Epididymis:     Right: Normal.     Left: Normal.     Prostate: Tender. Not enlarged and no nodules present.     Rectum: Normal. Guaiac result positive. No mass, tenderness, anal fissure, external hemorrhoid or internal hemorrhoid. Normal anal tone.  Musculoskeletal:        General: Normal range of motion.     Cervical back: Neck supple.     Right lower leg: No edema.     Left lower leg: No edema.  Lymphadenopathy:     Cervical: No cervical adenopathy.     Lower Body: No right inguinal adenopathy. No left inguinal adenopathy.  Skin:    General: Skin is warm and dry.     Findings: No rash.  Neurological:     General: No focal deficit present.     Mental Status: He is alert. Mental status is at baseline.  Psychiatric:        Mood and Affect: Mood normal.        Behavior: Behavior normal.     Lab Results  Component Value Date   WBC 6.9 08/15/2023   HGB 13.5 08/15/2023   HCT 40.7  08/15/2023   PLT 228.0 08/15/2023   GLUCOSE 124 (H) 08/15/2023   CHOL 129 06/15/2023   TRIG 73.0 06/15/2023   HDL 59.40 06/15/2023   LDLCALC 55 06/15/2023   ALT 34 08/15/2023   AST 25 08/15/2023   NA 138 08/15/2023   K 4.3 08/15/2023   CL 108 08/15/2023   CREATININE 1.92 (H) 08/15/2023   BUN 47 (H) 08/15/2023   CO2 21 08/15/2023   TSH 1.35 06/15/2023   PSA 0.29 06/15/2023   INR 1.1 (H) 08/15/2020   HGBA1C 6.0  06/15/2023   MICROALBUR 2.6 (H) 06/15/2023    US  Abdomen Limited RUQ (LIVER/GB) Result Date: 08/10/2020 CLINICAL DATA:  Epigastric pain. EXAM: ULTRASOUND ABDOMEN LIMITED RIGHT UPPER QUADRANT COMPARISON:  None. FINDINGS: Gallbladder: No gallstones or wall thickening visualized (1.6 mm). No sonographic Murphy sign noted by sonographer. Common bile duct: Diameter: 2.3 mm Liver: No focal lesion identified. There is diffusely increased echogenicity of the liver parenchyma which is also coarse in echotexture. Portal vein is patent on color Doppler imaging with normal direction of blood flow towards the liver. Other: None. IMPRESSION: Fatty, cirrhotic liver. Electronically Signed   By: Virgle Grime M.D.   On: 08/10/2020 15:37    DG ABD ACUTE 2+V W 1V CHEST Result Date: 08/15/2023 CLINICAL DATA:  Abdominal pain EXAM: DG ABDOMEN ACUTE WITH 1 VIEW CHEST COMPARISON:  September 25, 2005 FINDINGS: There is no evidence of dilated bowel loops or free intraperitoneal air. No radiopaque calculi or other significant radiographic abnormality is seen. Heart size and mediastinal contours are within normal limits. Both lungs are clear. IMPRESSION: Negative abdominal radiographs.  No acute cardiopulmonary disease. Electronically Signed   By: Fredrich Jefferson M.D.   On: 08/15/2023 10:37     Assessment & Plan:   Hypotension, unspecified hypotension type- Will hold the ACEI. EKG does not explain the low BP. -     EKG 12-Lead -     Basic metabolic panel with GFR; Future -     CBC with Differential/Platelet;  Future -     Hepatic function panel; Future -     Urinalysis, Routine w reflex microscopic; Future  Heme positive stool -     Ambulatory referral to Gastroenterology  Generalized abdominal tenderness without rebound tenderness- Will evaluate for PUD, malignancy, pancreatitis, occult abscess, mass. -     Lipase; Future -     Basic metabolic panel with GFR; Future -     CBC with Differential/Platelet; Future -     Hepatic function panel; Future -     Urinalysis, Routine w reflex microscopic; Future -     DG ABD ACUTE 2+V W 1V CHEST; Future -     C-reactive protein; Future -     CT ABDOMEN PELVIS W CONTRAST; Future  Dysuria -     CULTURE, URINE COMPREHENSIVE; Future  Acute idiopathic gout of multiple sites -     Uric acid; Future -     C-reactive protein; Future -     AMB Referral [facility] Care Management  Stage 3a chronic kidney disease (HCC) -     AMB Referral [facility] Care Management  Gout of multiple sites, unspecified cause, unspecified chronicity -     Colchicine ; TAKE 2 TABLETS BY MOUTH AT ONSET OF FLARE AND THEN 1 TABLET 1 HOUR LATER  Dispense: 60 tablet; Refill: 0 -     AMB Referral [facility] Care Management  Acute bacterial prostatitis -     Sulfamethoxazole -Trimethoprim ; Take 1 tablet by mouth 2 (two) times daily for 21 days.  Dispense: 42 tablet; Refill: 0 -     AMB Referral [facility] Care Management  Elevated lipase -     CT ABDOMEN PELVIS W CONTRAST; Future  Weight loss, unintentional -     CT ABDOMEN PELVIS W CONTRAST; Future     Follow-up: Return in about 3 weeks (around 09/05/2023).  Sandra Crouch, MD

## 2023-08-15 NOTE — Telephone Encounter (Unsigned)
 Copied from CRM [id number]. Topic: Clinical - Lab/Test Results >> Aug 15, 2023  1:20 PM [employer] wrote: Reason for CRM: Patient calling because he is having a hard time logging into his MyChart account to check the notes his doctor left for his lab results. I provided the MyChart support number [phone] and also relayed the notes from lab results. Patient had no further questions.

## 2023-08-15 NOTE — Patient Instructions (Signed)
 Abdominal Pain, Adult  Pain in the abdomen (abdominal pain) can be caused by many things. In most cases, it gets better with no treatment or by being treated at home. But in some cases, it can be serious. Your health care provider will ask questions about your medical history and do a physical exam to try to figure out what is causing your pain. Follow these instructions at home: Medicines Take over-the-counter and prescription medicines only as told by your provider. Do not take medicines that help you poop (laxatives) unless told by your provider. General instructions Watch your condition for any changes. Drink enough fluid to keep your pee (urine) pale yellow. Contact a health care provider if: Your pain changes, gets worse, or lasts longer than expected. You have severe cramping or bloating in your abdomen, or you vomit. Your pain gets worse with meals, after eating, or with certain foods. You are constipated or have diarrhea for more than 2-3 days. You are not hungry, or you lose weight without trying. You have signs of dehydration. These may include: Dark pee, very little pee, or no pee. Cracked lips or dry mouth. Sleepiness or weakness. You have pain when you pee (urinate) or poop. Your abdominal pain wakes you up at night. You have blood in your pee. You have a fever. Get help right away if: You cannot stop vomiting. Your pain is only in one part of the abdomen. Pain on the right side could be caused by appendicitis. You have bloody or black poop (stool), or poop that looks like tar. You have trouble breathing. You have chest pain. These symptoms may be an emergency. Get help right away. Call 911. Do not wait to see if the symptoms will go away. Do not drive yourself to the hospital. This information is not intended to replace advice given to you by your health care provider. Make sure you discuss any questions you have with your health care provider. Document Revised:  01/06/2022 Document Reviewed: 01/06/2022 Elsevier Patient Education  2024 [employer].

## 2023-08-15 NOTE — Telephone Encounter (Signed)
 Copied from CRM [id number]. Topic: Clinical - Red Word Triage >> Aug 15, 2023  8:06 AM Oddis Bench wrote: Red Word that prompted transfer to Nurse Triage: Patient is calling stating that he has uti and gaot.   Chief Complaint: Urinary symptoms and gout pain Symptoms: cloudy urine and ankle pain Frequency: constant Pertinent Negatives: Patient denies fever Disposition: [] ED /[] Urgent Care (no appt availability in office) / [x] Appointment(In office/virtual)/ []  [facility]/ [] Home Care/ [] Refused Recommended Disposition /[] [facility]/ []  Follow-up with PCP Additional Notes: Pt reports cloudy urine and pain with urination x 4 days; Also reports gout pain x 3 days. Appt scheduled for 0900 today  Reason for Disposition  Bad or foul-smelling urine  Answer Assessment - Initial Assessment Questions 1. SYMPTOM: "What's the main symptom you're concerned about?" (e.g., frequency, incontinence)     Cloudy urine and pain with urination  2. ONSET: "When did the  symptoms  start?"     4-5 days  3. PAIN: "Is there any pain?" If Yes, ask: "How bad is it?" (Scale: 1-10; mild, moderate, severe)     Mild pain  4. CAUSE: "What do you think is causing the symptoms?"     Concern for UTI  5. OTHER SYMPTOMS: "Do you have any other symptoms?" (e.g., blood in urine, fever, flank pain, pain with urination)     No  Answer Assessment - Initial Assessment Questions 1. ONSET: "When did the pain start?"      2 days ago  2. LOCATION: "Where is the pain located?"      Right ankle  3. PAIN: "How bad is the pain?"    (Scale 1-10; or mild, moderate, severe)  - MILD (1-3): doesn't interfere with normal activities.   - MODERATE (4-7): interferes with normal activities (e.g., work or school) or awakens from sleep, limping.   - SEVERE (8-10): excruciating pain, unable to do any normal activities, unable to walk.      Mild  4. WORK OR EXERCISE: "Has there been any recent work or exercise that  involved this part of the body?"      *No Answer* 5. CAUSE: "What do you think is causing the foot pain?"     Gout  6. OTHER SYMPTOMS: "Do you have any other symptoms?" (e.g., leg pain, rash, fever, numbness)     *No Answer* 7. PREGNANCY: "Is there any chance you are pregnant?" "When was your last menstrual period?"     *No Answer*  Protocols used: Urinary Symptoms-A-AH, Foot Pain-A-AH

## 2023-08-16 ENCOUNTER — Telehealth: Payer: Self-pay | Admitting: *Deleted

## 2023-08-16 NOTE — Progress Notes (Unsigned)
 Care Guide Pharmacy Note  08/16/2023 Name: Roger Alvarado MRN: [id number] DOB: 1950/06/23  Referred By: Roslyn Coombe, MD Reason for referral: Complex Care Management and Call Attempt #1 (Outreach to schedule referral with pharmacist )   Roger Alvarado is a 73 y.o. year old male who is a primary care patient of Roslyn Coombe, MD.  Roger Alvarado was referred to the pharmacist for assistance related to: DMII  An unsuccessful telephone outreach was attempted today to contact the patient who was referred to the pharmacy team for assistance with medication management. Additional attempts will be made to contact the patient.  Kandis Ormond, CMA [place]  [facility], [facility] Guide Direct Dial: [phone]  Fax: [phone] Website: [email or web address]

## 2023-08-16 NOTE — Telephone Encounter (Signed)
This has been noted.

## 2023-08-17 NOTE — Progress Notes (Unsigned)
 Care Guide Pharmacy Note  08/17/2023 Name: TIBERIUS ROMUALDO MRN: [id number] DOB: 1950-10-26  Referred By: Roslyn Coombe, MD Reason for referral: Complex Care Management and Call Attempt #1 (Outreach to schedule referral with pharmacist )   DALTYN KURTZER is a 73 y.o. year old male who is a primary care patient of Roslyn Coombe, MD.  Albertina Hugger was referred to the pharmacist for assistance related to: DMII  A second unsuccessful telephone outreach was attempted today to contact the patient who was referred to the pharmacy team for assistance with medication management. Additional attempts will be made to contact the patient.  Kandis Ormond, CMA [place]  [facility], [name] Guide Direct Dial: [phone]  Fax: [phone] Website: [email or web address]

## 2023-08-18 LAB — CULTURE, URINE COMPREHENSIVE

## 2023-08-18 NOTE — Progress Notes (Signed)
 Care Guide Pharmacy Note  08/18/2023 Name: [name] MRN: [id number] DOB: [birth year]  Referred By: Roslyn Coombe, MD Reason for referral: Complex Care Management and Call Attempt #1 (Outreach to schedule referral with pharmacist )   Roger Alvarado is a 73 y.o. year old male who is a primary care patient of Roslyn Coombe, MD.  Albertina Hugger was referred to the pharmacist for assistance related to: DMII  A third unsuccessful telephone outreach was attempted today to contact the patient who was referred to the pharmacy team for assistance with medication management. The Population Health team is pleased to engage with this patient at any time in the future upon receipt of referral and should he/she be interested in assistance from the Population Health team.  Kandis Ormond, CMA [facility]  [facility], [facility] Guide Direct Dial: [phone]  Fax: [phone] Website: [email or web address]

## 2023-09-06 ENCOUNTER — Other Ambulatory Visit: Payer: Self-pay | Admitting: Internal Medicine

## 2023-09-06 DIAGNOSIS — M109 Gout, unspecified: Secondary | ICD-10-CM

## 2023-09-07 ENCOUNTER — Other Ambulatory Visit: Payer: Self-pay

## 2023-09-09 ENCOUNTER — Encounter: Payer: Self-pay | Admitting: Internal Medicine

## 2023-09-28 ENCOUNTER — Other Ambulatory Visit: Payer: Self-pay | Admitting: Internal Medicine

## 2023-09-28 DIAGNOSIS — K7460 Unspecified cirrhosis of liver: Secondary | ICD-10-CM

## 2023-09-28 DIAGNOSIS — R1013 Epigastric pain: Secondary | ICD-10-CM

## 2023-09-29 DIAGNOSIS — R1013 Epigastric pain: Secondary | ICD-10-CM | POA: Diagnosis not present

## 2023-09-30 ENCOUNTER — Encounter: Payer: Self-pay | Admitting: Internal Medicine

## 2023-09-30 ENCOUNTER — Ambulatory Visit (INDEPENDENT_AMBULATORY_CARE_PROVIDER_SITE_OTHER): Admitting: Internal Medicine

## 2023-09-30 ENCOUNTER — Ambulatory Visit: Payer: Self-pay

## 2023-09-30 ENCOUNTER — Ambulatory Visit
Admission: RE | Admit: 2023-09-30 | Discharge: 2023-09-30 | Disposition: A | Discharge status: Home / Self Care | Source: Ambulatory Visit | Attending: Internal Medicine | Admitting: Internal Medicine

## 2023-09-30 ENCOUNTER — Ambulatory Visit: Payer: Self-pay | Admitting: Internal Medicine

## 2023-09-30 VITALS — BP 118/76 | HR 105 | Temp 98.2°F | Ht 68.5 in | Wt 215.0 lb

## 2023-09-30 DIAGNOSIS — R8290 Unspecified abnormal findings in urine: Secondary | ICD-10-CM

## 2023-09-30 DIAGNOSIS — R1013 Epigastric pain: Secondary | ICD-10-CM

## 2023-09-30 DIAGNOSIS — N200 Calculus of kidney: Secondary | ICD-10-CM | POA: Diagnosis not present

## 2023-09-30 DIAGNOSIS — R300 Dysuria: Secondary | ICD-10-CM | POA: Diagnosis not present

## 2023-09-30 DIAGNOSIS — E538 Deficiency of other specified B group vitamins: Secondary | ICD-10-CM

## 2023-09-30 DIAGNOSIS — K7689 Other specified diseases of liver: Secondary | ICD-10-CM | POA: Diagnosis not present

## 2023-09-30 DIAGNOSIS — K7460 Unspecified cirrhosis of liver: Secondary | ICD-10-CM

## 2023-09-30 DIAGNOSIS — E559 Vitamin D deficiency, unspecified: Secondary | ICD-10-CM

## 2023-09-30 DIAGNOSIS — E1165 Type 2 diabetes mellitus with hyperglycemia: Secondary | ICD-10-CM | POA: Diagnosis not present

## 2023-09-30 LAB — URINALYSIS, ROUTINE W REFLEX MICROSCOPIC
Bilirubin Urine: NEGATIVE
Ketones, ur: NEGATIVE
Nitrite: POSITIVE — AB
Specific Gravity, Urine: 1.015 (ref 1.000–1.030)
Total Protein, Urine: 100 — AB
Urine Glucose: NEGATIVE
Urobilinogen, UA: 0.2 (ref 0.0–1.0)
pH: 6 (ref 5.0–8.0)

## 2023-09-30 NOTE — Patient Instructions (Addendum)
 Please continue all other medications as before, and refills have been done if requested.  Please have the pharmacy call with any other refills you may need.  Please continue your efforts at being more active, low cholesterol diet, and weight control.  Please keep your appointments with your specialists as you may have planned  Please go to the LAB at the blood drawing area for the tests to be done  You will be contacted by phone if any changes need to be made immediately.  Otherwise, you will receive a letter about your results with an explanation, but please check with MyChart first.  Please make an Appointment to return in Sept 10, or sooner if needed

## 2023-09-30 NOTE — Assessment & Plan Note (Signed)
 Persists per pt despite recent bactrim  course with bactrim , pt is non toxic today, and suspect UTI has clinically resolved, but will need repeat UA and Cx to be sure.

## 2023-09-30 NOTE — Telephone Encounter (Signed)
 FYI Only or Action Required?: Action required by provider: clinical question for provider.  Patient was last seen in primary care on 08/15/2023 by Joshua Debby CROME, MD. Called Nurse Triage reporting No chief complaint on file.. Symptoms began several weeks ago. Interventions attempted: Prescription medications: Bactrim . Symptoms are: unchanged.  Triage Disposition: See Physician Within 24 Hours  Patient/caregiver understands and will follow disposition?: Yes  Copied from CRM [id number]. Topic: Clinical - Red Word Triage >> Sep 30, 2023  8:06 AM Thersia BROCKS wrote: [facility] that prompted transfer to Nurse Triage: Patient called in stated he was treated a couple of weeks ago regarding a infection in his urine, patient stated he still has some discolored in his urine and still has severe lower back pain , will like to get another urinalysis Reason for Disposition  [1] Taking antibiotic > 72 hours (3 days) for UTI or STI AND [2] penile discharge (yellow or white pus) is SAME (unchanged, not better)  Answer Assessment - Initial Assessment Questions 1. MAIN SYMPTOM: What is the main symptom you are concerned about? (e.g., painful urination, urine frequency)     White Foggy Urine, Lower Abdominal Pain  2. BETTER-SAME-WORSE: Are you getting better, staying the same, or getting worse compared to how you felt at your last visit to the doctor (most recent medical visit)?     Same  3. PAIN: How bad is the pain?  (e.g., Scale 1-10; mild, moderate, or severe)   - MILD (1-3): complains slightly about urination hurting   - MODERATE (4-7): interferes with normal activities     - SEVERE (8-10): excruciating, unwilling or unable to urinate because of the pain      3-4  4. FEVER: Do you have a fever? If Yes, ask: What is it, how was it measured, and when did it start?     No, Night Sweats  5. OTHER SYMPTOMS: Do you have any other symptoms? (e.g., blood in the urine, flank pain, scrotal pain or  swelling)     No  6. DIAGNOSIS: When was the UTI diagnosed? By whom? Was it a kidney infection, bladder infection or both?     08-15-2023  7. ANTIBIOTIC: What antibiotic(s) are you taking? How many times per day?     Bactrim   8. ANTIBIOTIC - START DATE: When did you start taking the antibiotic?     3 Weeks ago  Protocols used: Urinary Tract Infection on Antibiotic Follow-up Call - Male-A-AH

## 2023-09-30 NOTE — Assessment & Plan Note (Signed)
 Lab Results  Component Value Date   HGBA1C 6.0 06/15/2023   Stable, pt to continue current medical treatment  - diet, wt control

## 2023-09-30 NOTE — Assessment & Plan Note (Signed)
 Last vitamin D Lab Results  Component Value Date   VD25OH 19.58 (L) 06/15/2023   Low, to start oral replacement

## 2023-09-30 NOTE — Progress Notes (Signed)
 Patient ID: Roger Alvarado, male   DOB: 05-25-1950, 73 y.o.   MRN: [id number]        Chief Complaint: follow up persistent cloudy urine, DM, low b12 and D       HPI:  Roger Alvarado is a 73 y.o. male here with c/o recent visit here with finding of UTI tx with bactrim , proven to be e coli and sens to bactrim .  Denies urinary symptoms such as dysuria, frequency, urgency, flank pain, hematuria or n/v, fever, chills, but has persistent cloudy urine and fears the infection is trying to return or the antibotic did not work somehow   Pt denies polydipsia, polyuria, or new focal neuro s/s.  Pt denies chest pain, increased sob or doe, wheezing, orthopnea, PND, increased LE swelling, palpitations, dizziness or syncope.       Wt Readings from Last 3 Encounters:  09/30/23 215 lb (97.5 kg)  08/15/23 238 lb (108 kg)  07/15/23 242 lb (109.8 kg)   BP Readings from Last 3 Encounters:  09/30/23 118/76  08/15/23 118/70  07/15/23 120/81         Past Medical History:  Diagnosis Date   Allergy    Arthritis    Asthma    Childhood   Diabetes mellitus    Diverticulitis    Diverticulosis    Gout    Gout    Hyperlipidemia    Obesity, Class III, BMI 40-49.9 (morbid obesity)    History reviewed. No pertinent surgical history.  reports that he has never smoked. He has never used smokeless tobacco. He reports current alcohol use of about 2.0 standard drinks of alcohol per week. He reports that he does not use drugs. family history includes Asthma in his mother; Diabetes in his brother, brother, father, and sister; Heart attack in his maternal grandfather; Hypertension in his brother, brother, and father. No Known Allergies Current Outpatient Medications on File Prior to Visit  Medication Sig Dispense Refill   lisinopril  (ZESTRIL ) 10 MG tablet Take 10 mg by mouth daily.     aspirin 81 MG tablet Take 81 mg by mouth daily.     colchicine  0.6 MG tablet TAKE 2 TABLETS BY MOUTH AT ONSET OF FLARE AND THEN 1  TABLET 1 HOUR LATER 180 tablet 1   Multiple Vitamin (MULTI VITAMIN) TABS 1 tablet Orally occasionally     rosuvastatin  (CRESTOR ) 40 MG tablet TAKE 1 TABLET BY MOUTH EVERY DAY 90 tablet 3   vitamin B-12 (CYANOCOBALAMIN ) 1000 MCG tablet Take 1 tablet (1,000 mcg total) by mouth daily. 90 tablet 3   No current facility-administered medications on file prior to visit.        ROS:  All others reviewed and negative.  Objective        PE:  BP 118/76 (BP Location: Left Arm, Patient Position: Sitting, Cuff Size: Normal)   Pulse (!) 105   Temp 98.2 F (36.8 C) (Oral)   Ht 5' 8.5 (1.74 m)   Wt 215 lb (97.5 kg)   SpO2 99%   BMI 32.22 kg/m                 Constitutional: Pt appears in NAD               HENT: Head: NCAT.                Right Ear: External ear normal.  Left Ear: External ear normal.                Eyes: . Pupils are equal, round, and reactive to light. Conjunctivae and EOM are normal               Nose: without d/c or deformity               Neck: Neck supple. Gross normal ROM               Cardiovascular: Normal rate and regular rhythm.                 Pulmonary/Chest: Effort normal and breath sounds without rales or wheezing.                Abd:  Soft, NT, ND, + BS, no organomegaly               Neurological: Pt is alert. At baseline orientation, motor grossly intact               Skin: Skin is warm. No rashes, no other new lesions, LE edema - none               Psychiatric: Pt behavior is normal without agitation   Micro: none  Cardiac tracings I have personally interpreted today:  none  Pertinent Radiological findings (summarize): none   Lab Results  Component Value Date   WBC 6.9 08/15/2023   HGB 13.5 08/15/2023   HCT 40.7 08/15/2023   PLT 228.0 08/15/2023   GLUCOSE 124 (H) 08/15/2023   CHOL 129 06/15/2023   TRIG 73.0 06/15/2023   HDL 59.40 06/15/2023   LDLCALC 55 06/15/2023   ALT 34 08/15/2023   AST 25 08/15/2023   NA 138 08/15/2023   K  4.3 08/15/2023   CL 108 08/15/2023   CREATININE 1.92 (H) 08/15/2023   BUN 47 (H) 08/15/2023   CO2 21 08/15/2023   TSH 1.35 06/15/2023   PSA 0.29 06/15/2023   INR 1.1 (H) 08/15/2020   HGBA1C 6.0 06/15/2023   MICROALBUR 2.6 (H) 06/15/2023   Assessment/Plan:  Roger Alvarado is a 73 y.o. Black or African American [2] male with  has a past medical history of Allergy, Arthritis, Asthma, Diabetes mellitus, Diverticulitis, Diverticulosis, Gout, Gout, Hyperlipidemia, and Obesity, Class III, BMI 40-49.9 (morbid obesity).  Cloudy urine Persists per pt despite recent bactrim  course with bactrim , pt is non toxic today, and suspect UTI has clinically resolved, but will need repeat UA and Cx to be sure.    Type 2 diabetes mellitus (HCC) Lab Results  Component Value Date   HGBA1C 6.0 06/15/2023   Stable, pt to continue current medical treatment  - diet, wt control   Vitamin D  deficiency Last vitamin D  Lab Results  Component Value Date   VD25OH 19.58 (L) 06/15/2023   Low, to start oral replacement   B12 deficiency Lab Results  Component Value Date   VITAMINB12 143 (L) 06/15/2023   Low, to start oral replacement - b12 1000 mcg qd  Followup: Return in about 2 months (around 12/14/2023).  Lynwood Rush, MD 09/30/2023 12:57 PM [facility] [place] [facility]

## 2023-09-30 NOTE — Assessment & Plan Note (Signed)
 Lab Results  Component Value Date   VITAMINB12 143 (L) 06/15/2023   Low, to start oral replacement - b12 1000 mcg qd

## 2023-10-02 ENCOUNTER — Other Ambulatory Visit: Payer: Self-pay | Admitting: Internal Medicine

## 2023-10-02 LAB — URINE CULTURE

## 2023-10-02 MED ORDER — NITROFURANTOIN MONOHYD MACRO 100 MG PO CAPS: 100.0000 mg | ORAL_CAPSULE | Freq: Two times a day (BID) | ORAL | 0 refills | Status: DC

## 2023-10-17 ENCOUNTER — Ambulatory Visit: Payer: Self-pay

## 2023-10-17 ENCOUNTER — Encounter: Payer: Self-pay | Admitting: Internal Medicine

## 2023-10-17 ENCOUNTER — Ambulatory Visit (INDEPENDENT_AMBULATORY_CARE_PROVIDER_SITE_OTHER): Admitting: Internal Medicine

## 2023-10-17 ENCOUNTER — Ambulatory Visit: Payer: Self-pay | Admitting: Internal Medicine

## 2023-10-17 VITALS — BP 126/74 | HR 98 | Temp 98.4°F | Ht 68.5 in | Wt 225.8 lb

## 2023-10-17 DIAGNOSIS — N1831 Chronic kidney disease, stage 3a: Secondary | ICD-10-CM

## 2023-10-17 DIAGNOSIS — E1165 Type 2 diabetes mellitus with hyperglycemia: Secondary | ICD-10-CM

## 2023-10-17 DIAGNOSIS — E559 Vitamin D deficiency, unspecified: Secondary | ICD-10-CM | POA: Diagnosis not present

## 2023-10-17 DIAGNOSIS — N3000 Acute cystitis without hematuria: Secondary | ICD-10-CM | POA: Insufficient documentation

## 2023-10-17 DIAGNOSIS — I10 Essential (primary) hypertension: Secondary | ICD-10-CM

## 2023-10-17 LAB — URINALYSIS, ROUTINE W REFLEX MICROSCOPIC
Bilirubin Urine: NEGATIVE
Ketones, ur: NEGATIVE
Nitrite: POSITIVE — AB
Specific Gravity, Urine: 1.02 (ref 1.000–1.030)
Total Protein, Urine: 100 — AB
Urine Glucose: NEGATIVE
Urobilinogen, UA: 0.2 (ref 0.0–1.0)
pH: 6 (ref 5.0–8.0)

## 2023-10-17 MED ORDER — NITROFURANTOIN MONOHYD MACRO 100 MG PO CAPS: 100.0000 mg | ORAL_CAPSULE | Freq: Two times a day (BID) | ORAL | 0 refills | Status: AC

## 2023-10-17 NOTE — Assessment & Plan Note (Signed)
 Lab Results  Component Value Date   CREATININE 1.92 (H) 08/15/2023   Stable overall, cont to avoid nephrotoxins

## 2023-10-17 NOTE — Assessment & Plan Note (Signed)
 BP Readings from Last 3 Encounters:  10/17/23 126/74  09/30/23 118/76  08/15/23 118/70   Stable, pt to continue medical treatment lisinopril  10 mg qd

## 2023-10-17 NOTE — Progress Notes (Signed)
 Patient ID: Roger Alvarado, male   DOB: [birth year], 73 y.o.   MRN: [id number]        Chief Complaint: follow up recurrent cloudy urine, dm, htn, ckd3a,        HPI:  Roger Alvarado is a 73 y.o. male here with c/o recent UTI and seemed to do quite well with nitrofurantoin  100 bid for 10 days for resistant E Coli infection.  Now with recurrent cloudy urine without blood but with some frequency.   Denies urinary symptoms such as urgency, flank pain, hematuria or n/v, fever, chills.  Pt denies chest pain, increased sob or doe, wheezing, orthopnea, PND, increased LE swelling, palpitations, dizziness or syncope.   Pt denies polydipsia, polyuria, or new focal neuro s/s.          Wt Readings from Last 3 Encounters:  10/17/23 225 lb 12.8 oz (102.4 kg)  09/30/23 215 lb (97.5 kg)  08/15/23 238 lb (108 kg)   BP Readings from Last 3 Encounters:  10/17/23 126/74  09/30/23 118/76  08/15/23 118/70         Past Medical History:  Diagnosis Date   Allergy    Arthritis    Asthma    Childhood   Diabetes mellitus    Diverticulitis    Diverticulosis    Gout    Gout    Hyperlipidemia    Obesity, Class III, BMI 40-49.9 (morbid obesity)    History reviewed. No pertinent surgical history.  reports that he has never smoked. He has never used smokeless tobacco. He reports current alcohol use of about 2.0 standard drinks of alcohol per week. He reports that he does not use drugs. family history includes Asthma in his mother; Diabetes in his brother, brother, father, and sister; Heart attack in his maternal grandfather; Hypertension in his brother, brother, and father. No Known Allergies Current Outpatient Medications on File Prior to Visit  Medication Sig Dispense Refill   aspirin 81 MG tablet Take 81 mg by mouth daily.     colchicine  0.6 MG tablet TAKE 2 TABLETS BY MOUTH AT ONSET OF FLARE AND THEN 1 TABLET 1 HOUR LATER 180 tablet 1   lisinopril  (ZESTRIL ) 10 MG tablet Take 10 mg by mouth daily.      Multiple Vitamin (MULTI VITAMIN) TABS 1 tablet Orally occasionally     rosuvastatin  (CRESTOR ) 40 MG tablet TAKE 1 TABLET BY MOUTH EVERY DAY 90 tablet 3   vitamin B-12 (CYANOCOBALAMIN ) 1000 MCG tablet Take 1 tablet (1,000 mcg total) by mouth daily. 90 tablet 3   No current facility-administered medications on file prior to visit.        ROS:  All others reviewed and negative.  Objective        PE:  BP 126/74   Pulse 98   Temp 98.4 F (36.9 C)   Ht 5' 8.5 (1.74 m)   Wt 225 lb 12.8 oz (102.4 kg)   SpO2 97%   BMI 33.83 kg/m                 Constitutional: Pt appears in NAD               HENT: Head: NCAT.                Right Ear: External ear normal.                 Left Ear: External ear normal.  Eyes: . Pupils are equal, round, and reactive to light. Conjunctivae and EOM are normal               Nose: without d/c or deformity               Neck: Neck supple. Gross normal ROM               Cardiovascular: Normal rate and regular rhythm.                 Pulmonary/Chest: Effort normal and breath sounds without rales or wheezing.                Abd:  Soft, NT, ND, + BS, no organomegaly               Neurological: Pt is alert. At baseline orientation, motor grossly intact               Skin: Skin is warm. No rashes, no other new lesions, LE edema - none               Psychiatric: Pt behavior is normal without agitation   Micro: none  Cardiac tracings I have personally interpreted today:  none  Pertinent Radiological findings (summarize): none   Lab Results  Component Value Date   WBC 6.9 08/15/2023   HGB 13.5 08/15/2023   HCT 40.7 08/15/2023   PLT 228.0 08/15/2023   GLUCOSE 124 (H) 08/15/2023   CHOL 129 06/15/2023   TRIG 73.0 06/15/2023   HDL 59.40 06/15/2023   LDLCALC 55 06/15/2023   ALT 34 08/15/2023   AST 25 08/15/2023   NA 138 08/15/2023   K 4.3 08/15/2023   CL 108 08/15/2023   CREATININE 1.92 (H) 08/15/2023   BUN 47 (H) 08/15/2023   CO2 21  08/15/2023   TSH 1.35 06/15/2023   PSA 0.29 06/15/2023   INR 1.1 (H) 08/15/2020   HGBA1C 6.0 06/15/2023   MICROALBUR 2.6 (H) 06/15/2023   Assessment/Plan:  Roger Alvarado is a 73 y.o. Black or African American [2] male with  has a past medical history of Allergy, Arthritis, Asthma, Diabetes mellitus, Diverticulitis, Diverticulosis, Gout, Gout, Hyperlipidemia, and Obesity, Class III, BMI 40-49.9 (morbid obesity).  Vitamin D  deficiency Last vitamin D  Lab Results  Component Value Date   VD25OH 19.58 (L) 06/15/2023   Low, to start oral replacement   Type 2 diabetes mellitus (HCC) Lab Results  Component Value Date   HGBA1C 6.0 06/15/2023   Stable, pt to continue current medical treatment  - diet, wt control   HTN (hypertension) BP Readings from Last 3 Encounters:  10/17/23 126/74  09/30/23 118/76  08/15/23 118/70   Stable, pt to continue medical treatment lisinopril  10 mg qd   CKD (chronic kidney disease) stage 3, GFR 30-59 ml/min (HCC) Lab Results  Component Value Date   CREATININE 1.92 (H) 08/15/2023   Stable overall, cont to avoid nephrotoxins   Acute cystitis without hematuria With very quick restart symptomatic UTI symptoms, likely resistant e coli recurrent, for ua and cx, but also nitrofurantoin  100 bid x 4 wks, o/w may need urology referral if not improving  Followup: Return if symptoms worsen or fail to improve.  Lynwood Rush, MD 10/17/2023 11:58 AM [facility] [place] [facility] - [facility]

## 2023-10-17 NOTE — Assessment & Plan Note (Signed)
 Last vitamin D Lab Results  Component Value Date   VD25OH 19.58 (L) 06/15/2023   Low, to start oral replacement

## 2023-10-17 NOTE — Patient Instructions (Signed)
 Ok to restart the nitrofurantoin  for longer treatment this time  Please continue all other medications as before, and refills have been done if requested.  Please have the pharmacy call with any other refills you may need.  Please keep your appointments with your specialists as you may have planned  Please go to the LAB at the blood drawing area for the tests to be done - just the urine testing this time  You will be contacted by phone if any changes need to be made immediately.  Otherwise, you will receive a letter about your results with an explanation, but please check with MyChart first.

## 2023-10-17 NOTE — Telephone Encounter (Signed)
 FYI Only or Action Required?: Action required by provider: request for appointment.  Patient was last seen in primary care on 09/30/2023 by Norleen Lynwood ORN, MD.  Called Nurse Triage reporting Urinary symptoms.  Symptoms began several days ago.  Interventions attempted: Nothing.  Symptoms are: gradually worsening. Finished antibiotics last week and this weekend began to have cloudy urine, pt. Concerned infection is back.  Triage Disposition: See Physician Within 24 Hours  Patient/caregiver understands and will follow disposition?: Yes     Copied from CRM [id number]. Topic: Clinical - Red Word Triage >> Oct 17, 2023  9:14 AM Elle L wrote: Red Word that prompted transfer to Nurse Triage: The patient states he had a prostate infection but the bacteria was resistant to the antibiotic. He saw Dr. Norleen on 6/27 and was prescribed another medication that he finished on Wednesday. However, now the symptoms are returning. He has cloudy urine and pain when urinating. Reason for Disposition  Bad or foul-smelling urine  Answer Assessment - Initial Assessment Questions 1. SYMPTOM: What's the main symptom you're concerned about? (e.g., frequency, incontinence)     Cloudy urine 2. ONSET: When did the    start?     3 weeks ago 3. PAIN: Is there any pain? If Yes, ask: How bad is it? (Scale: 1-10; mild, moderate, severe)     no 4. CAUSE: What do you think is causing the symptoms?     unsure 5. OTHER SYMPTOMS: Do you have any other symptoms? (e.g., blood in urine, fever, flank pain, pain with urination)     no 6. PREGNANCY: Is there any chance you are pregnant? When was your last menstrual period?     N/a  Protocols used: Urinary Symptoms-A-AH

## 2023-10-17 NOTE — Assessment & Plan Note (Signed)
 With very quick restart symptomatic UTI symptoms, likely resistant e coli recurrent, for ua and cx, but also nitrofurantoin  100 bid x 4 wks, o/w may need urology referral if not improving

## 2023-10-17 NOTE — Assessment & Plan Note (Signed)
 Lab Results  Component Value Date   HGBA1C 6.0 06/15/2023   Stable, pt to continue current medical treatment  - diet, wt control

## 2023-10-19 LAB — URINE CULTURE

## 2023-11-04 ENCOUNTER — Other Ambulatory Visit: Payer: Self-pay | Admitting: Internal Medicine

## 2023-11-08 DIAGNOSIS — K7460 Unspecified cirrhosis of liver: Secondary | ICD-10-CM | POA: Diagnosis not present

## 2023-11-21 DIAGNOSIS — N3020 Other chronic cystitis without hematuria: Secondary | ICD-10-CM | POA: Diagnosis not present

## 2023-11-21 DIAGNOSIS — N200 Calculus of kidney: Secondary | ICD-10-CM | POA: Diagnosis not present

## 2023-12-13 ENCOUNTER — Telehealth: Payer: Self-pay

## 2023-12-13 NOTE — Progress Notes (Signed)
 Brief Telephone Documentation Reason for Call: Pharmacy Quality Measure Review  This patient is appearing on a report for being at risk of failing the adherence measure for diabetes medications this calendar year.   Medication:  Mounjaro  2.5 mg SQ weekly LF 06/15/23 for 30DS Metformin  1,000 mg  LF 08/02/23 for 90DS  Per chart review, provider stopped metformin  due to decrease in renal function and Mounjaro  was discontinued due to patient preference.   Attempted to contact patient to verify Mounjaro  discontinuation.   Summary of Call: Called patient 09/09. No answer, left voicemail with direct callback number.   Follow Up: Patient given direct line for further questions/concerns.  Woodie Jock, PharmD PGY1 Pharmacy Resident

## 2023-12-14 ENCOUNTER — Ambulatory Visit: Admitting: Internal Medicine

## 2023-12-18 NOTE — Progress Notes (Addendum)
 Reason for Infectious Disease Consult: chronic lower urinary symptoms and colonization of urine with ESBL  Requesting Physician: Norleen Seltzer, MD  Subjective:    Patient ID: Roger Alvarado, male    DOB: 08-Jul-1950, 73 y.o.   MRN: [id number]  HPI  Discussed the use of AI scribe software for clinical note transcription with the patient, who gave verbal consent to proceed.  History of Present Illness   Roger Alvarado is a 73 year old male who presents with persistent urinary discoloration and lower abdominal pain. He was referred by Dr. Brunetta, his urologist, for evaluation of persistent urinary symptoms.  He has experienced urinary symptoms since June 2025, characterized by urine discoloration without a burning sensation. Initial treatment with Bactrim , which he was told was for a prostate infection, was ineffective. A subsequent urine culture was performed showing ESBL found below after which he was prescribed nitrofurantoin  (Macrobid ) for ten days, which temporarily resolved the discoloration. However, discoloration recurred after cessation of the medication, and he was prescribed an extended course of nitrofurantoin  for one month. Despite this, the discoloration returned after stopping the medication. He has now been off antibiotics for a month he tells me   I have culture from urine from Dr. Maynard office from 11/21/2023:  He reports occasional lower abdominal tenderness but no frequent pain or burning sensation during urination. He currently reports only urine discoloration and occasional lower abdominal tenderness though not in recent days.  His past medical history includes diverticulosis with previous episodes of diverticulitis requiring hospitalization. He has been treated with pantoprazole for heartburn, which may have coincided with improvement in gastrointestinal symptoms too.      Urine culture from November 21, 2023:      Past Medical History:  Diagnosis Date   Allergy     Arthritis    Asthma    Childhood   Diabetes mellitus    Diverticulitis    Diverticulosis    Gout    Gout    Hyperlipidemia    Obesity, Class III, BMI 40-49.9 (morbid obesity)     No past surgical history on file.  Family History  Problem Relation Age of Onset   Diabetes Brother    Hypertension Brother    Diabetes Brother    Hypertension Brother    Diabetes Father    Hypertension Father    Diabetes Sister    Asthma Mother    Heart attack Maternal Grandfather       Social History   Socioeconomic History   Marital status: Married    Spouse name: Not on file   Number of children: 2   Years of education: 18   Highest education level: Not on file  Occupational History   Occupation: Retired  Tobacco Use   Smoking status: Never   Smokeless tobacco: Never  Vaping Use   Vaping status: Never Used  Substance and Sexual Activity   Alcohol use: Yes    Alcohol/week: 2.0 standard drinks of alcohol    Types: 2 Cans of beer per week   Drug use: No   Sexual activity: Not Currently  Other Topics Concern   Not on file  Social History Narrative   Fun: Watch sports, gardening    Lives with wife   Social Drivers of Health   Financial Resource Strain: Low Risk  (07/15/2023)   Overall Financial Resource Strain (CARDIA)    Difficulty of Paying Living Expenses: Not very hard  Food Insecurity: No Food Insecurity (07/15/2023)   Hunger  Vital Sign    Worried About Programme researcher, broadcasting/film/video in the Last Year: Never true    Ran Out of Food in the Last Year: Never true  Transportation Needs: No Transportation Needs (07/15/2023)   PRAPARE - Administrator, Civil Service (Medical): No    Lack of Transportation (Non-Medical): No  Physical Activity: Sufficiently Active (07/15/2023)   Exercise Vital Sign    Days of Exercise per Week: 3 days    Minutes of Exercise per Session: 50 min  Stress: No Stress Concern Present (07/15/2023)   [employer] of Occupational Health -  Occupational Stress Questionnaire    Feeling of Stress : Not at all  Social Connections: Moderately Isolated (07/15/2023)   Social Connection and Isolation Panel    Frequency of Communication with Friends and Family: Three times a week    Frequency of Social Gatherings with Friends and Family: Once a week    Attends Religious Services: Never    Database administrator or Organizations: No    Attends Banker Meetings: Never    Marital Status: Married    No Known Allergies   Current Outpatient Medications:    aspirin 81 MG tablet, Take 81 mg by mouth daily., Disp: , Rfl:    colchicine  0.6 MG tablet, TAKE 2 TABLETS BY MOUTH AT ONSET OF FLARE AND THEN 1 TABLET 1 HOUR LATER, Disp: 180 tablet, Rfl: 1   lisinopril  (ZESTRIL ) 10 MG tablet, Take 10 mg by mouth daily., Disp: , Rfl:    Multiple Vitamin (MULTI VITAMIN) TABS, 1 tablet Orally occasionally, Disp: , Rfl:    nitrofurantoin , macrocrystal-monohydrate, (MACROBID ) 100 MG capsule, Take 1 capsule (100 mg total) by mouth 2 (two) times daily., Disp: 60 capsule, Rfl: 0   rosuvastatin  (CRESTOR ) 40 MG tablet, TAKE 1 TABLET BY MOUTH EVERY DAY, Disp: 90 tablet, Rfl: 3   vitamin B-12 (CYANOCOBALAMIN ) 1000 MCG tablet, Take 1 tablet (1,000 mcg total) by mouth daily., Disp: 90 tablet, Rfl: 3   Review of Systems  Constitutional:  Negative for activity change, appetite change, chills, diaphoresis, fatigue, fever and unexpected weight change.  HENT:  Negative for congestion, rhinorrhea, sinus pressure, sneezing, sore throat and trouble swallowing.   Eyes:  Negative for photophobia and visual disturbance.  Respiratory:  Negative for cough, chest tightness, shortness of breath, wheezing and stridor.   Cardiovascular:  Negative for chest pain, palpitations and leg swelling.  Gastrointestinal:  Negative for abdominal distention, abdominal pain, anal bleeding, blood in stool, constipation, diarrhea, nausea and vomiting.  Genitourinary:  Negative for  difficulty urinating, dysuria, flank pain and hematuria.  Musculoskeletal:  Negative for arthralgias, back pain, gait problem, joint swelling and myalgias.  Skin:  Negative for color change, pallor, rash and wound.  Neurological:  Negative for dizziness, tremors, weakness and light-headedness.  Hematological:  Negative for adenopathy. Does not bruise/bleed easily.  Psychiatric/Behavioral:  Negative for agitation, behavioral problems, confusion, decreased concentration, dysphoric mood and sleep disturbance.        Objective:   Physical Exam Constitutional:      Appearance: He is well-developed.  HENT:     Head: Normocephalic and atraumatic.  Eyes:     Conjunctiva/sclera: Conjunctivae normal.  Cardiovascular:     Rate and Rhythm: Normal rate and regular rhythm.  Pulmonary:     Effort: Pulmonary effort is normal. No respiratory distress.     Breath sounds: No wheezing.  Abdominal:     General: There is no distension.  Palpations: Abdomen is soft.  Musculoskeletal:        General: No tenderness. Normal range of motion.     Cervical back: Normal range of motion and neck supple.  Skin:    General: Skin is warm and dry.     Coloration: Skin is not pale.     Findings: No erythema or rash.  Neurological:     General: No focal deficit present.     Mental Status: He is alert and oriented to person, place, and time.  Psychiatric:        Mood and Affect: Mood normal.        Behavior: Behavior normal.        Thought Content: Thought content normal.        Judgment: Judgment normal.           Assessment & Plan:   Assessment and Plan    Bladder colonization with ESBL-producing E. coli Chronic colonization with ESBL-producing E. coli. No active urinary tract infection symptoms. Current discoloration not indicative of infection. Since we cannot reverse the underlying reasons he is prone to colonization of urine and occasionally infection there is not point at trying to sterilize  his urine. Certainly WHEN and IF he develops symptoms c/w UTI he would need treatment. If he had pyelonephritis he would have to be rx with an IV carbapenem - Avoid unnecessary antibiotics to prevent future resistance. - Monitor for dysuria or significant abdominal pain. - Educated on not treating asymptomatic bacteriuria. - Provided contact information for urgent consultation if symptoms worsen.   IP: should be on contact if hospitalized      I have personally spent 80 minutes involved in face-to-face and non-face-to-face activities for this patient on the day of the visit. Professional time spent includes the following activities: Preparing to see the patient (review of tests), Obtaining and/or reviewing separately obtained history (admission/discharge record), Performing a medically appropriate examination and/or evaluation , Ordering medications/tests/procedures, referring and communicating with other health care professionals, Documenting clinical information in the EMR, Independently interpreting results (not separately reported), Communicating results to the patient/family/caregiver, Counseling and educating the patient/family/caregiver and Care coordination (not separately reported).

## 2023-12-19 ENCOUNTER — Other Ambulatory Visit: Payer: Self-pay

## 2023-12-19 ENCOUNTER — Ambulatory Visit: Admitting: Infectious Disease

## 2023-12-19 ENCOUNTER — Other Ambulatory Visit: Payer: Self-pay | Admitting: Internal Medicine

## 2023-12-19 ENCOUNTER — Encounter: Payer: Self-pay | Admitting: Infectious Disease

## 2023-12-19 VITALS — BP 124/79 | HR 77 | Temp 98.0°F | Ht 69.0 in | Wt 228.0 lb

## 2023-12-19 DIAGNOSIS — R3989 Other symptoms and signs involving the genitourinary system: Secondary | ICD-10-CM

## 2023-12-19 DIAGNOSIS — Z1612 Extended spectrum beta lactamase (ESBL) resistance: Secondary | ICD-10-CM | POA: Diagnosis not present

## 2023-12-19 DIAGNOSIS — K5790 Diverticulosis of intestine, part unspecified, without perforation or abscess without bleeding: Secondary | ICD-10-CM | POA: Insufficient documentation

## 2023-12-19 DIAGNOSIS — Z22358 Carrier of other enterobacterales: Secondary | ICD-10-CM

## 2023-12-19 DIAGNOSIS — R319 Hematuria, unspecified: Secondary | ICD-10-CM | POA: Diagnosis not present

## 2023-12-19 HISTORY — DX: Diverticulosis of intestine, part unspecified, without perforation or abscess without bleeding: K57.90

## 2023-12-21 DIAGNOSIS — N200 Calculus of kidney: Secondary | ICD-10-CM | POA: Diagnosis not present

## 2023-12-21 DIAGNOSIS — R3121 Asymptomatic microscopic hematuria: Secondary | ICD-10-CM | POA: Diagnosis not present

## 2023-12-21 DIAGNOSIS — N3020 Other chronic cystitis without hematuria: Secondary | ICD-10-CM | POA: Diagnosis not present

## 2023-12-23 ENCOUNTER — Telehealth: Payer: Self-pay | Admitting: Internal Medicine

## 2023-12-23 NOTE — Telephone Encounter (Signed)
 Patient requested refill of metformin  1000 MG. Requested medication is not on patient's current medication list. Please advise.

## 2023-12-23 NOTE — Telephone Encounter (Signed)
 Copied from CRM [id number]. Topic: Clinical - Medication Refill >> Dec 23, 2023  8:24 AM Tanazia G wrote: Medication: metFORMIN  (GLUCOPHAGE ) 1000 MG tablet  Has the patient contacted their pharmacy? Yes (Agent: If no, request that the patient contact the pharmacy for the refill. If patient does not wish to contact the pharmacy document the reason why and proceed with request.) (Agent: If yes, when and what did the pharmacy advise?)  This is the patient's preferred pharmacy:  [facility] [phone] [address], [place] - [address] [place] [address] Phone: [phone] Fax: [phone]  Is this the correct pharmacy for this prescription? Yes If no, delete pharmacy and type the correct one.   Has the prescription been filled recently? yes  Is the patient out of the medication? Yes  Has the patient been seen for an appointment in the last year OR does the patient have an upcoming appointment? Yes  Can we respond through MyChart? Yes  Agent: Please be advised that Rx refills may take up to 3 business days. We ask that you follow-up with your pharmacy.

## 2023-12-26 ENCOUNTER — Encounter: Payer: Self-pay | Admitting: Urology

## 2023-12-30 NOTE — Telephone Encounter (Signed)
 Copied from CRM [id number]. Topic: Clinical - Medication Question >> Dec 30, 2023  8:58 AM Anairis L wrote: Reason for CRM: Patient is calling following up on his metformin  medication refill being denied.   Per Bernette Falling, [facility] 12/13/2023/ Per chart review, provider stopped metformin  due to decrease in renal function  Please call patient.

## 2024-01-02 NOTE — Telephone Encounter (Signed)
 Attempted to call patient but had to leave a voice message for call back.

## 2024-01-03 DIAGNOSIS — K7460 Unspecified cirrhosis of liver: Secondary | ICD-10-CM | POA: Diagnosis not present

## 2024-01-03 DIAGNOSIS — R1013 Epigastric pain: Secondary | ICD-10-CM | POA: Diagnosis not present

## 2024-01-04 ENCOUNTER — Ambulatory Visit (INDEPENDENT_AMBULATORY_CARE_PROVIDER_SITE_OTHER): Admitting: Internal Medicine

## 2024-01-04 ENCOUNTER — Encounter: Payer: Self-pay | Admitting: Internal Medicine

## 2024-01-04 VITALS — BP 120/78 | HR 67 | Temp 97.8°F | Ht 69.0 in | Wt 227.0 lb

## 2024-01-04 DIAGNOSIS — E538 Deficiency of other specified B group vitamins: Secondary | ICD-10-CM

## 2024-01-04 DIAGNOSIS — E1122 Type 2 diabetes mellitus with diabetic chronic kidney disease: Secondary | ICD-10-CM | POA: Diagnosis not present

## 2024-01-04 DIAGNOSIS — E7800 Pure hypercholesterolemia, unspecified: Secondary | ICD-10-CM

## 2024-01-04 DIAGNOSIS — I10 Essential (primary) hypertension: Secondary | ICD-10-CM | POA: Diagnosis not present

## 2024-01-04 DIAGNOSIS — E1165 Type 2 diabetes mellitus with hyperglycemia: Secondary | ICD-10-CM

## 2024-01-04 DIAGNOSIS — E559 Vitamin D deficiency, unspecified: Secondary | ICD-10-CM | POA: Diagnosis not present

## 2024-01-04 DIAGNOSIS — N1831 Chronic kidney disease, stage 3a: Secondary | ICD-10-CM | POA: Diagnosis not present

## 2024-01-04 DIAGNOSIS — N3000 Acute cystitis without hematuria: Secondary | ICD-10-CM | POA: Diagnosis not present

## 2024-01-04 DIAGNOSIS — Z23 Encounter for immunization: Secondary | ICD-10-CM

## 2024-01-04 MED ORDER — METFORMIN HCL ER 500 MG PO TB24: 500.0000 mg | ORAL_TABLET | Freq: Every day | ORAL | 3 refills | Status: AC

## 2024-01-04 NOTE — Patient Instructions (Signed)
 You had the flu shot today  Ok to restart the metformin  ER 500 mg at 2 per day as before, though we need to repeat the kidney blood tests today  Please continue all other medications as before, and refills have been done if requested.  Please have the pharmacy call with any other refills you may need.  Please continue your efforts at being more active, low cholesterol diet, and weight control.  Please keep your appointments with your specialists as you may have planned  Please go to the LAB at the blood drawing area for the tests to be done  You will be contacted by phone if any changes need to be made immediately.  Otherwise, you will receive a letter about your results with an explanation, but please check with MyChart first.  Please make an Appointment to return in 6 months, or sooner if needed

## 2024-01-04 NOTE — Progress Notes (Unsigned)
 Patient ID: Roger Alvarado, male   DOB: [birth year], 73 y.o.   MRN: [id number]        Chief Complaint: follow up low vit d, dm, hld, htn, low b12, recent acute on chronic UTI,        HPI:  Roger Alvarado is a 73 y.o. male here with hx of chronic uti with acute flare requiring bactrim  x 3 wks, then nitrofurantoin  for one month per urology. Has then seen ID and felt no longer antibiotic tx needed.  Provider MD gave his number to pt to call for recurrence symptoms.   Denies urinary symptoms such as dysuria, frequency, urgency, flank pain, hematuria or n/v, fever, chills. Did also have also mild AKI on CKD, due for f/u lab.   Due for flu shot.  Has run out of metformin  for 3 wks but ok with restarting        Wt Readings from Last 3 Encounters:  01/04/24 227 lb (103 kg)  12/19/23 228 lb (103.4 kg)  10/17/23 225 lb 12.8 oz (102.4 kg)   BP Readings from Last 3 Encounters:  01/04/24 120/78  12/19/23 124/79  10/17/23 126/74         Past Medical History:  Diagnosis Date   Allergy    Arthritis    Asthma    Childhood   Diabetes mellitus    Diverticulitis    Diverticulosis    Diverticulosis 12/19/2023   ESBL E. coli carrier 12/19/2023   Gout    Gout    Hematuria 12/19/2023   Hyperlipidemia    Obesity, Class III, BMI 40-49.9 (morbid obesity) (HCC)    History reviewed. No pertinent surgical history.  reports that he has never smoked. He has never used smokeless tobacco. He reports that he does not currently use alcohol after a past usage of about 2.0 standard drinks of alcohol per week. He reports that he does not use drugs. family history includes Asthma in his mother; Diabetes in his brother, brother, father, and sister; Heart attack in his maternal grandfather; Hypertension in his brother, brother, and father. No Known Allergies Current Outpatient Medications on File Prior to Visit  Medication Sig Dispense Refill   aspirin 81 MG tablet Take 81 mg by mouth daily.     colchicine  0.6 MG  tablet TAKE 2 TABLETS BY MOUTH AT ONSET OF FLARE AND THEN 1 TABLET 1 HOUR LATER 180 tablet 1   lisinopril  (ZESTRIL ) 10 MG tablet Take 10 mg by mouth daily.     Multiple Vitamin (MULTI VITAMIN) TABS 1 tablet Orally occasionally     nitrofurantoin , macrocrystal-monohydrate, (MACROBID ) 100 MG capsule Take 1 capsule (100 mg total) by mouth 2 (two) times daily. 60 capsule 0   pantoprazole (PROTONIX) 40 MG tablet Take 40 mg by mouth daily.     rosuvastatin  (CRESTOR ) 40 MG tablet TAKE 1 TABLET BY MOUTH EVERY DAY 90 tablet 3   vitamin B-12 (CYANOCOBALAMIN ) 1000 MCG tablet Take 1 tablet (1,000 mcg total) by mouth daily. 90 tablet 3   No current facility-administered medications on file prior to visit.        ROS:  All others reviewed and negative.  Objective        PE:  BP 120/78 (BP Location: Left Arm, Patient Position: Sitting, Cuff Size: Normal)   Pulse 67   Temp 97.8 F (36.6 C) (Oral)   Ht 5' 9 (1.753 m)   Wt 227 lb (103 kg)   SpO2 98%   BMI 33.52  kg/m                 Constitutional: Pt appears in NAD               HENT: Head: NCAT.                Right Ear: External ear normal.                 Left Ear: External ear normal.                Eyes: . Pupils are equal, round, and reactive to light. Conjunctivae and EOM are normal               Nose: without d/c or deformity               Neck: Neck supple. Gross normal ROM               Cardiovascular: Normal rate and regular rhythm.                 Pulmonary/Chest: Effort normal and breath sounds without rales or wheezing.                Abd:  Soft, NT, ND, + BS, no organomegaly               Neurological: Pt is alert. At baseline orientation, motor grossly intact               Skin: Skin is warm. No rashes, no other new lesions, LE edema - none               Psychiatric: Pt behavior is normal without agitation   Micro: none  Cardiac tracings I have personally interpreted today:  none  Pertinent Radiological findings (summarize):  none   Lab Results  Component Value Date   WBC 6.2 01/04/2024   HGB 11.7 (L) 01/04/2024   HCT 36.0 (L) 01/04/2024   PLT 311.0 01/04/2024   GLUCOSE 115 (H) 01/04/2024   CHOL 105 01/04/2024   TRIG 65.0 01/04/2024   HDL 40.10 01/04/2024   LDLCALC 52 01/04/2024   ALT 56 (H) 01/04/2024   AST 31 01/04/2024   NA 139 01/04/2024   K 4.5 01/04/2024   CL 108 01/04/2024   CREATININE 1.49 01/04/2024   BUN 25 (H) 01/04/2024   CO2 26 01/04/2024   TSH 1.35 06/15/2023   PSA 0.29 06/15/2023   INR 1.1 (H) 08/15/2020   HGBA1C 6.4 01/04/2024   MICROALBUR 2.6 (H) 06/15/2023   Assessment/Plan:  Roger Alvarado is a 73 y.o. Black or African American [2] male with  has a past medical history of Allergy, Arthritis, Asthma, Diabetes mellitus, Diverticulitis, Diverticulosis, Diverticulosis (12/19/2023), ESBL E. coli carrier (12/19/2023), Gout, Gout, Hematuria (12/19/2023), Hyperlipidemia, and Obesity, Class III, BMI 40-49.9 (morbid obesity) (HCC).  Vitamin D  deficiency Last vitamin D  Lab Results  Component Value Date   VD25OH 16.47 (L) 01/04/2024   Low, to start oral replacement   Hyperlipidemia Lab Results  Component Value Date   LDLCALC 52 01/04/2024   Stable, pt to continue current statin crestor  40 mg qd   HTN (hypertension) BP Readings from Last 3 Encounters:  01/04/24 120/78  12/19/23 124/79  10/17/23 126/74   Stable, pt to continue medical treatment lisinopril  10 mg qd   Diabetes mellitus with chronic kidney disease (HCC) Lab Results  Component Value Date   HGBA1C 6.4 01/04/2024  CKD3a Stable, pt to continue  current medical treatment metformin  ER 500 mg - 2 qd   B12 deficiency Lab Results  Component Value Date   VITAMINB12 211 01/04/2024   Low, to start oral replacement - b12 1000 mcg qd   Acute cystitis without hematuria Resolved, also for f/u BMP for renal fxn  Followup: Return in about 6 months (around 07/04/2024).  Lynwood Rush, MD 01/05/2024 11:40 AM [facility]

## 2024-01-05 ENCOUNTER — Other Ambulatory Visit: Payer: Self-pay | Admitting: Internal Medicine

## 2024-01-05 ENCOUNTER — Ambulatory Visit: Payer: Self-pay | Admitting: Internal Medicine

## 2024-01-05 ENCOUNTER — Encounter: Payer: Self-pay | Admitting: Internal Medicine

## 2024-01-05 DIAGNOSIS — D649 Anemia, unspecified: Secondary | ICD-10-CM

## 2024-01-05 LAB — LIPID PANEL
Cholesterol: 105 mg/dL (ref 0–200)
HDL: 40.1 mg/dL (ref >39.00)
LDL Cholesterol: 52 mg/dL (ref 0–99)
NonHDL: 64.8
Total CHOL/HDL Ratio: 3
Triglycerides: 65 mg/dL (ref 0.0–149.0)
VLDL: 13 mg/dL (ref 0.0–40.0)

## 2024-01-05 LAB — BASIC METABOLIC PANEL WITH GFR
BUN: 25 mg/dL — ABNORMAL HIGH (ref 6–23)
CO2: 26 meq/L (ref 19–32)
Calcium: 9.6 mg/dL (ref 8.4–10.5)
Chloride: 108 meq/L (ref 96–112)
Creatinine, Ser: 1.49 mg/dL (ref 0.40–1.50)
GFR: 46.04 mL/min — ABNORMAL LOW (ref >60.00)
Glucose, Bld: 115 mg/dL — ABNORMAL HIGH (ref 70–99)
Potassium: 4.5 meq/L (ref 3.5–5.1)
Sodium: 139 meq/L (ref 135–145)

## 2024-01-05 LAB — HEPATIC FUNCTION PANEL
ALT: 56 U/L — ABNORMAL HIGH (ref 0–53)
AST: 31 U/L (ref 0–37)
Albumin: 3.7 g/dL (ref 3.5–5.2)
Alkaline Phosphatase: 66 U/L (ref 39–117)
Bilirubin, Direct: 0 mg/dL (ref 0.0–0.3)
Total Bilirubin: 0.4 mg/dL (ref 0.2–1.2)
Total Protein: 7.4 g/dL (ref 6.0–8.3)

## 2024-01-05 LAB — CBC WITH DIFFERENTIAL/PLATELET
Basophils Absolute: 0.1 K/uL (ref 0.0–0.1)
Basophils Relative: 0.9 % (ref 0.0–3.0)
Eosinophils Absolute: 0.2 K/uL (ref 0.0–0.7)
Eosinophils Relative: 3.6 % (ref 0.0–5.0)
HCT: 36 % — ABNORMAL LOW (ref 39.0–52.0)
Hemoglobin: 11.7 g/dL — ABNORMAL LOW (ref 13.0–17.0)
Lymphocytes Relative: 47 % — ABNORMAL HIGH (ref 12.0–46.0)
Lymphs Abs: 2.9 K/uL (ref 0.7–4.0)
MCHC: 32.6 g/dL (ref 30.0–36.0)
MCV: 90.7 fl (ref 78.0–100.0)
Monocytes Absolute: 0.6 K/uL (ref 0.1–1.0)
Monocytes Relative: 9.8 % (ref 3.0–12.0)
Neutro Abs: 2.4 K/uL (ref 1.4–7.7)
Neutrophils Relative %: 38.7 % — ABNORMAL LOW (ref 43.0–77.0)
Platelets: 311 K/uL (ref 150.0–400.0)
RBC: 3.97 Mil/uL — ABNORMAL LOW (ref 4.22–5.81)
RDW: 14.4 % (ref 11.5–15.5)
WBC: 6.2 K/uL (ref 4.0–10.5)

## 2024-01-05 LAB — HEMOGLOBIN A1C: Hgb A1c MFr Bld: 6.4 % (ref 4.6–6.5)

## 2024-01-05 LAB — VITAMIN B12: Vitamin B-12: 211 pg/mL (ref 211–911)

## 2024-01-05 LAB — VITAMIN D 25 HYDROXY (VIT D DEFICIENCY, FRACTURES): VITD: 16.47 ng/mL — ABNORMAL LOW (ref 30.00–100.00)

## 2024-01-05 NOTE — Assessment & Plan Note (Signed)
 BP Readings from Last 3 Encounters:  01/04/24 120/78  12/19/23 124/79  10/17/23 126/74   Stable, pt to continue medical treatment lisinopril  10 mg qd

## 2024-01-05 NOTE — Assessment & Plan Note (Signed)
 Lab Results  Component Value Date   VITAMINB12 211 01/04/2024   Low, to start oral replacement - b12 1000 mcg qd

## 2024-01-05 NOTE — Assessment & Plan Note (Signed)
 Last vitamin D  Lab Results  Component Value Date   VD25OH 16.47 (L) 01/04/2024   Low, to start oral replacement

## 2024-01-05 NOTE — Assessment & Plan Note (Addendum)
 Lab Results  Component Value Date   HGBA1C 6.4 01/04/2024  CKD3a Stable, pt to continue current medical treatment metformin  ER 500 mg - 2 qd

## 2024-01-05 NOTE — Assessment & Plan Note (Signed)
 Lab Results  Component Value Date   LDLCALC 52 01/04/2024   Stable, pt to continue current statin crestor  40 mg qd

## 2024-01-05 NOTE — Assessment & Plan Note (Signed)
 Resolved, also for f/u BMP for renal fxn

## 2024-01-11 ENCOUNTER — Other Ambulatory Visit: Payer: Self-pay | Admitting: Internal Medicine

## 2024-01-11 ENCOUNTER — Other Ambulatory Visit: Payer: Self-pay

## 2024-03-01 ENCOUNTER — Other Ambulatory Visit: Payer: Self-pay | Admitting: Internal Medicine

## 2024-04-02 ENCOUNTER — Telehealth: Payer: Self-pay | Admitting: Pharmacist

## 2024-04-02 NOTE — Telephone Encounter (Signed)
 Pharmacy Quality Measure Review  This patient is appearing on a report for being at risk of failing the adherence measure for diabetes medications this calendar year.   Medication: Metformin  Last fill date: 01/04/24 for 90 day supply  Contacted pharmacy to facilitate refills.  Darrelyn Drum, PharmD, BCPS, CPP Clinical Pharmacist Practitioner [facility] [phone]

## 2024-04-13 ENCOUNTER — Telehealth: Payer: Self-pay

## 2024-04-13 NOTE — Progress Notes (Signed)
 Pharmacy Quality Measure Review  This patient is appearing on a report for being at risk of failing the adherence measure for diabetes medications this calendar year.   Medication: Metformin  500mg  tablet Last fill date: 04/04/24 for 90 day supply  Reviewed medication indication, dosing, and goals of therapy.   Patient reports Metformin  dose was changed last year and oversupply from old Rx affected on time refills. Patient recently filled current Rx on 04/04/24. Discussed barriers to adherence, none identified.   Dionicia Canavan, PharmD, RPh PGY1 Acute Care Pharmacy Resident [facility]  04/13/2024 2:28 PM

## 2024-07-17 ENCOUNTER — Ambulatory Visit
